# Patient Record
Sex: Female | Born: 1950 | Race: White | Hispanic: No | Marital: Single | Smoking: Former smoker
Health system: Southern US, Community
[De-identification: ages and names within clinical notes are randomized; demographics above are authoritative.]

## PROBLEM LIST (undated history)

## (undated) DIAGNOSIS — M810 Age-related osteoporosis without current pathological fracture: Secondary | ICD-10-CM

## (undated) DIAGNOSIS — E785 Hyperlipidemia, unspecified: Secondary | ICD-10-CM

## (undated) DIAGNOSIS — M549 Dorsalgia, unspecified: Secondary | ICD-10-CM

## (undated) DIAGNOSIS — R011 Cardiac murmur, unspecified: Secondary | ICD-10-CM

## (undated) DIAGNOSIS — G8929 Other chronic pain: Secondary | ICD-10-CM

## (undated) DIAGNOSIS — J45909 Unspecified asthma, uncomplicated: Secondary | ICD-10-CM

## (undated) DIAGNOSIS — C801 Malignant (primary) neoplasm, unspecified: Secondary | ICD-10-CM

## (undated) DIAGNOSIS — M1990 Unspecified osteoarthritis, unspecified site: Secondary | ICD-10-CM

## (undated) DIAGNOSIS — E739 Lactose intolerance, unspecified: Secondary | ICD-10-CM

## (undated) DIAGNOSIS — E039 Hypothyroidism, unspecified: Secondary | ICD-10-CM

## (undated) DIAGNOSIS — K279 Peptic ulcer, site unspecified, unspecified as acute or chronic, without hemorrhage or perforation: Secondary | ICD-10-CM

## (undated) DIAGNOSIS — I10 Essential (primary) hypertension: Secondary | ICD-10-CM

## (undated) DIAGNOSIS — I4891 Unspecified atrial fibrillation: Secondary | ICD-10-CM

## (undated) DIAGNOSIS — R931 Abnormal findings on diagnostic imaging of heart and coronary circulation: Secondary | ICD-10-CM

## (undated) DIAGNOSIS — Z973 Presence of spectacles and contact lenses: Secondary | ICD-10-CM

## (undated) DIAGNOSIS — K589 Irritable bowel syndrome without diarrhea: Secondary | ICD-10-CM

## (undated) DIAGNOSIS — Z853 Personal history of malignant neoplasm of breast: Secondary | ICD-10-CM

## (undated) DIAGNOSIS — E119 Type 2 diabetes mellitus without complications: Secondary | ICD-10-CM

## (undated) HISTORY — PX: OTHER SURGICAL HISTORY: SHX169

## (undated) HISTORY — DX: Personal history of malignant neoplasm of breast: Z85.3

## (undated) HISTORY — DX: Age-related osteoporosis without current pathological fracture: M81.0

## (undated) HISTORY — DX: Unspecified asthma, uncomplicated: J45.909

## (undated) HISTORY — DX: Irritable bowel syndrome, unspecified: K58.9

## (undated) HISTORY — PX: ABDOMINAL HYSTERECTOMY: SHX81

## (undated) HISTORY — DX: Type 2 diabetes mellitus without complications: E11.9

## (undated) HISTORY — DX: Lactose intolerance, unspecified: E73.9

## (undated) HISTORY — PX: BREAST RECONSTRUCTION: SHX9

## (undated) HISTORY — DX: Hypothyroidism, unspecified: E03.9

## (undated) HISTORY — DX: Abnormal findings on diagnostic imaging of heart and coronary circulation: R93.1

## (undated) HISTORY — DX: Essential (primary) hypertension: I10

## (undated) HISTORY — PX: BREAST IMPLANT REMOVAL: SHX5361

## (undated) HISTORY — DX: Other chronic pain: G89.29

## (undated) HISTORY — PX: PARTIAL GASTRECTOMY: SHX2172

## (undated) HISTORY — DX: Peptic ulcer, site unspecified, unspecified as acute or chronic, without hemorrhage or perforation: K27.9

## (undated) HISTORY — DX: Hyperlipidemia, unspecified: E78.5

## (undated) HISTORY — PX: CHOLECYSTECTOMY: SHX55

## (undated) HISTORY — DX: Malignant (primary) neoplasm, unspecified: C80.1

## (undated) HISTORY — DX: Unspecified atrial fibrillation: I48.91

## (undated) HISTORY — DX: Cardiac murmur, unspecified: R01.1

## (undated) HISTORY — DX: Dorsalgia, unspecified: M54.9

---

## 1997-05-02 DIAGNOSIS — K263 Acute duodenal ulcer without hemorrhage or perforation: Secondary | ICD-10-CM

## 1997-05-02 DIAGNOSIS — Z853 Personal history of malignant neoplasm of breast: Secondary | ICD-10-CM

## 1997-05-02 HISTORY — DX: Acute duodenal ulcer without hemorrhage or perforation: K26.3

## 1997-05-02 HISTORY — DX: Personal history of malignant neoplasm of breast: Z85.3

## 1997-11-05 ENCOUNTER — Other Ambulatory Visit: Admission: RE | Admit: 1997-11-05 | Discharge: 1997-11-05 | Payer: Self-pay | Admitting: *Deleted

## 1997-11-26 ENCOUNTER — Other Ambulatory Visit: Admission: RE | Admit: 1997-11-26 | Discharge: 1997-11-26 | Payer: Self-pay | Admitting: Gastroenterology

## 1997-12-24 ENCOUNTER — Ambulatory Visit (HOSPITAL_COMMUNITY): Admission: RE | Admit: 1997-12-24 | Discharge: 1997-12-24 | Payer: Self-pay | Admitting: Gastroenterology

## 1997-12-25 ENCOUNTER — Other Ambulatory Visit: Admission: RE | Admit: 1997-12-25 | Discharge: 1997-12-25 | Payer: Self-pay | Admitting: General Surgery

## 1997-12-30 ENCOUNTER — Encounter: Payer: Self-pay | Admitting: General Surgery

## 1997-12-31 HISTORY — PX: BREAST LUMPECTOMY: SHX2

## 1998-01-01 ENCOUNTER — Encounter: Payer: Self-pay | Admitting: General Surgery

## 1998-01-01 ENCOUNTER — Ambulatory Visit (HOSPITAL_COMMUNITY): Admission: RE | Admit: 1998-01-01 | Discharge: 1998-01-01 | Payer: Self-pay | Admitting: General Surgery

## 1998-01-07 ENCOUNTER — Inpatient Hospital Stay (HOSPITAL_COMMUNITY): Admission: RE | Admit: 1998-01-07 | Discharge: 1998-01-13 | Payer: Self-pay | Admitting: General Surgery

## 1998-01-14 ENCOUNTER — Encounter: Admission: RE | Admit: 1998-01-14 | Discharge: 1998-04-14 | Payer: Self-pay | Admitting: Radiation Oncology

## 1998-03-16 ENCOUNTER — Observation Stay (HOSPITAL_COMMUNITY): Admission: AD | Admit: 1998-03-16 | Discharge: 1998-03-17 | Payer: Self-pay | Admitting: Oncology

## 1998-03-31 ENCOUNTER — Emergency Department (HOSPITAL_COMMUNITY): Admission: EM | Admit: 1998-03-31 | Discharge: 1998-03-31 | Payer: Self-pay | Admitting: Emergency Medicine

## 1998-08-12 ENCOUNTER — Encounter: Admission: RE | Admit: 1998-08-12 | Discharge: 1998-11-10 | Payer: Self-pay | Admitting: Radiation Oncology

## 1999-03-09 ENCOUNTER — Encounter: Payer: Self-pay | Admitting: Oncology

## 1999-03-09 ENCOUNTER — Encounter: Admission: RE | Admit: 1999-03-09 | Discharge: 1999-03-09 | Payer: Self-pay | Admitting: Oncology

## 1999-04-15 ENCOUNTER — Encounter: Payer: Self-pay | Admitting: Internal Medicine

## 1999-04-15 ENCOUNTER — Ambulatory Visit (HOSPITAL_COMMUNITY): Admission: RE | Admit: 1999-04-15 | Discharge: 1999-04-15 | Payer: Self-pay | Admitting: Internal Medicine

## 1999-04-19 ENCOUNTER — Emergency Department (HOSPITAL_COMMUNITY): Admission: EM | Admit: 1999-04-19 | Discharge: 1999-04-19 | Payer: Self-pay | Admitting: Internal Medicine

## 1999-04-19 ENCOUNTER — Encounter: Payer: Self-pay | Admitting: Internal Medicine

## 1999-07-16 ENCOUNTER — Ambulatory Visit (HOSPITAL_COMMUNITY): Admission: RE | Admit: 1999-07-16 | Discharge: 1999-07-16 | Payer: Self-pay | Admitting: Neurosurgery

## 1999-07-16 ENCOUNTER — Encounter: Payer: Self-pay | Admitting: Neurosurgery

## 1999-07-30 ENCOUNTER — Encounter: Payer: Self-pay | Admitting: Neurosurgery

## 1999-07-30 ENCOUNTER — Ambulatory Visit (HOSPITAL_COMMUNITY): Admission: RE | Admit: 1999-07-30 | Discharge: 1999-07-30 | Payer: Self-pay | Admitting: Neurosurgery

## 1999-08-20 ENCOUNTER — Ambulatory Visit (HOSPITAL_COMMUNITY): Admission: RE | Admit: 1999-08-20 | Discharge: 1999-08-20 | Payer: Self-pay | Admitting: Neurosurgery

## 1999-08-20 ENCOUNTER — Encounter: Payer: Self-pay | Admitting: Neurosurgery

## 1999-09-27 ENCOUNTER — Encounter: Payer: Self-pay | Admitting: Oncology

## 1999-09-27 ENCOUNTER — Encounter: Admission: RE | Admit: 1999-09-27 | Discharge: 1999-09-27 | Payer: Self-pay | Admitting: Oncology

## 1999-11-10 ENCOUNTER — Ambulatory Visit (HOSPITAL_COMMUNITY): Admission: RE | Admit: 1999-11-10 | Discharge: 1999-11-12 | Payer: Self-pay | Admitting: Neurosurgery

## 1999-11-12 ENCOUNTER — Encounter: Payer: Self-pay | Admitting: Neurosurgery

## 2000-04-03 ENCOUNTER — Encounter: Payer: Self-pay | Admitting: Oncology

## 2000-04-03 ENCOUNTER — Encounter: Admission: RE | Admit: 2000-04-03 | Discharge: 2000-04-03 | Payer: Self-pay | Admitting: Oncology

## 2000-12-13 ENCOUNTER — Encounter: Admission: RE | Admit: 2000-12-13 | Discharge: 2000-12-13 | Payer: Self-pay | Admitting: Oncology

## 2000-12-13 ENCOUNTER — Encounter: Payer: Self-pay | Admitting: Oncology

## 2000-12-19 ENCOUNTER — Ambulatory Visit (HOSPITAL_COMMUNITY): Admission: RE | Admit: 2000-12-19 | Discharge: 2000-12-19 | Payer: Self-pay | Admitting: Neurosurgery

## 2000-12-21 ENCOUNTER — Encounter: Admission: RE | Admit: 2000-12-21 | Discharge: 2000-12-21 | Payer: Self-pay | Admitting: Neurosurgery

## 2000-12-21 ENCOUNTER — Encounter: Payer: Self-pay | Admitting: Neurosurgery

## 2001-01-15 ENCOUNTER — Encounter: Payer: Self-pay | Admitting: Neurosurgery

## 2001-01-15 ENCOUNTER — Encounter: Admission: RE | Admit: 2001-01-15 | Discharge: 2001-01-15 | Payer: Self-pay | Admitting: Neurosurgery

## 2001-02-01 ENCOUNTER — Encounter: Payer: Self-pay | Admitting: Neurosurgery

## 2001-02-01 ENCOUNTER — Encounter: Admission: RE | Admit: 2001-02-01 | Discharge: 2001-02-01 | Payer: Self-pay | Admitting: Neurosurgery

## 2001-09-14 ENCOUNTER — Encounter: Payer: Self-pay | Admitting: Emergency Medicine

## 2001-09-14 ENCOUNTER — Emergency Department (HOSPITAL_COMMUNITY): Admission: EM | Admit: 2001-09-14 | Discharge: 2001-09-14 | Payer: Self-pay | Admitting: Emergency Medicine

## 2001-12-18 ENCOUNTER — Encounter: Payer: Self-pay | Admitting: Oncology

## 2001-12-18 ENCOUNTER — Encounter: Admission: RE | Admit: 2001-12-18 | Discharge: 2001-12-18 | Payer: Self-pay | Admitting: Oncology

## 2002-01-18 ENCOUNTER — Encounter: Admission: RE | Admit: 2002-01-18 | Discharge: 2002-01-18 | Payer: Self-pay | Admitting: Neurosurgery

## 2002-01-18 ENCOUNTER — Encounter: Payer: Self-pay | Admitting: Neurosurgery

## 2002-01-31 ENCOUNTER — Encounter: Admission: RE | Admit: 2002-01-31 | Discharge: 2002-01-31 | Payer: Self-pay | Admitting: Neurosurgery

## 2002-01-31 ENCOUNTER — Encounter: Payer: Self-pay | Admitting: Neurosurgery

## 2002-05-22 ENCOUNTER — Emergency Department (HOSPITAL_COMMUNITY): Admission: EM | Admit: 2002-05-22 | Discharge: 2002-05-22 | Payer: Self-pay | Admitting: Emergency Medicine

## 2002-12-10 ENCOUNTER — Encounter: Payer: Self-pay | Admitting: Oncology

## 2002-12-10 ENCOUNTER — Encounter: Admission: RE | Admit: 2002-12-10 | Discharge: 2002-12-10 | Payer: Self-pay | Admitting: Oncology

## 2003-05-21 ENCOUNTER — Encounter: Admission: RE | Admit: 2003-05-21 | Discharge: 2003-05-21 | Payer: Self-pay | Admitting: Oncology

## 2003-06-20 ENCOUNTER — Encounter (INDEPENDENT_AMBULATORY_CARE_PROVIDER_SITE_OTHER): Payer: Self-pay | Admitting: *Deleted

## 2003-06-20 ENCOUNTER — Encounter: Admission: RE | Admit: 2003-06-20 | Discharge: 2003-06-20 | Payer: Self-pay | Admitting: General Surgery

## 2003-07-01 HISTORY — PX: MASTECTOMY: SHX3

## 2003-07-21 ENCOUNTER — Encounter (INDEPENDENT_AMBULATORY_CARE_PROVIDER_SITE_OTHER): Payer: Self-pay | Admitting: *Deleted

## 2003-07-21 ENCOUNTER — Inpatient Hospital Stay (HOSPITAL_COMMUNITY): Admission: RE | Admit: 2003-07-21 | Discharge: 2003-07-23 | Payer: Self-pay | Admitting: General Surgery

## 2004-01-19 ENCOUNTER — Encounter: Admission: RE | Admit: 2004-01-19 | Discharge: 2004-01-19 | Payer: Self-pay | Admitting: Family Medicine

## 2004-03-29 ENCOUNTER — Ambulatory Visit: Payer: Self-pay | Admitting: Oncology

## 2004-03-31 ENCOUNTER — Ambulatory Visit (HOSPITAL_COMMUNITY): Admission: RE | Admit: 2004-03-31 | Discharge: 2004-03-31 | Payer: Self-pay | Admitting: Plastic Surgery

## 2004-10-13 ENCOUNTER — Ambulatory Visit (HOSPITAL_COMMUNITY): Admission: RE | Admit: 2004-10-13 | Discharge: 2004-10-13 | Payer: Self-pay | Admitting: Plastic Surgery

## 2004-10-19 ENCOUNTER — Ambulatory Visit (HOSPITAL_COMMUNITY): Admission: RE | Admit: 2004-10-19 | Discharge: 2004-10-20 | Payer: Self-pay | Admitting: Plastic Surgery

## 2005-03-01 ENCOUNTER — Ambulatory Visit: Payer: Self-pay | Admitting: Gastroenterology

## 2005-03-21 ENCOUNTER — Encounter (INDEPENDENT_AMBULATORY_CARE_PROVIDER_SITE_OTHER): Payer: Self-pay | Admitting: *Deleted

## 2005-03-21 ENCOUNTER — Ambulatory Visit: Payer: Self-pay | Admitting: Gastroenterology

## 2005-03-29 ENCOUNTER — Ambulatory Visit: Payer: Self-pay | Admitting: *Deleted

## 2005-04-08 ENCOUNTER — Ambulatory Visit (HOSPITAL_COMMUNITY): Admission: RE | Admit: 2005-04-08 | Discharge: 2005-04-08 | Payer: Self-pay | Admitting: Gastroenterology

## 2005-04-18 ENCOUNTER — Encounter: Admission: RE | Admit: 2005-04-18 | Discharge: 2005-04-18 | Payer: Self-pay | Admitting: Family Medicine

## 2005-04-19 ENCOUNTER — Ambulatory Visit: Payer: Self-pay | Admitting: Gastroenterology

## 2005-08-18 ENCOUNTER — Emergency Department (HOSPITAL_COMMUNITY): Admission: EM | Admit: 2005-08-18 | Discharge: 2005-08-19 | Payer: Self-pay | Admitting: Emergency Medicine

## 2005-08-19 ENCOUNTER — Encounter: Payer: Self-pay | Admitting: Vascular Surgery

## 2006-04-19 ENCOUNTER — Encounter: Admission: RE | Admit: 2006-04-19 | Discharge: 2006-04-19 | Payer: Self-pay | Admitting: Family Medicine

## 2007-04-23 ENCOUNTER — Encounter: Admission: RE | Admit: 2007-04-23 | Discharge: 2007-04-23 | Payer: Self-pay | Admitting: Family Medicine

## 2008-01-18 ENCOUNTER — Encounter: Admission: RE | Admit: 2008-01-18 | Discharge: 2008-01-18 | Payer: Self-pay | Admitting: Family Medicine

## 2008-04-28 ENCOUNTER — Encounter: Admission: RE | Admit: 2008-04-28 | Discharge: 2008-04-28 | Payer: Self-pay | Admitting: Family Medicine

## 2008-09-30 DIAGNOSIS — R931 Abnormal findings on diagnostic imaging of heart and coronary circulation: Secondary | ICD-10-CM

## 2008-09-30 HISTORY — DX: Abnormal findings on diagnostic imaging of heart and coronary circulation: R93.1

## 2009-04-17 ENCOUNTER — Inpatient Hospital Stay (HOSPITAL_COMMUNITY): Admission: EM | Admit: 2009-04-17 | Discharge: 2009-04-22 | Payer: Self-pay | Admitting: Emergency Medicine

## 2009-07-13 ENCOUNTER — Ambulatory Visit (HOSPITAL_BASED_OUTPATIENT_CLINIC_OR_DEPARTMENT_OTHER): Admission: RE | Admit: 2009-07-13 | Discharge: 2009-07-13 | Payer: Self-pay | Admitting: Orthopedic Surgery

## 2009-11-30 ENCOUNTER — Emergency Department (HOSPITAL_COMMUNITY): Admission: EM | Admit: 2009-11-30 | Discharge: 2009-11-30 | Payer: Self-pay | Admitting: Emergency Medicine

## 2010-01-14 ENCOUNTER — Encounter (INDEPENDENT_AMBULATORY_CARE_PROVIDER_SITE_OTHER): Payer: Self-pay | Admitting: *Deleted

## 2010-06-01 NOTE — Letter (Signed)
Summary: Colonoscopy Letter  [facility]  [address] [place], [place] [address]   Phone: [phone]  Fax: [phone]      January 14, 2010 MRN: [id number]   [facility] [address] [place], [place]  [address]   Dear Ms. STONE,   According to your medical record, it is time for you to schedule a Colonoscopy. The American Cancer Society recommends this procedure as a method to detect early colon cancer. Patients with a family history of colon cancer, or a personal history of colon polyps or inflammatory bowel disease are at increased risk.  This letter has beeen generated based on the recommendations made at the time of your procedure. If you feel that in your particular situation this may no longer apply, please contact our office.  Please call our office at [phone] to schedule this appointment or to update your records at your earliest convenience.  Thank you for cooperating with [place] to provide you with the very best care possible.   Sincerely,  Judie Petit T. Russella Dar, M.D.  [facility] [phone]

## 2010-07-26 LAB — POCT I-STAT 4, (NA,K, GLUC, HGB,HCT)
Glucose, Bld: 158 mg/dL — ABNORMAL HIGH (ref 70–99)
Hemoglobin: 13.9 g/dL (ref 12.0–15.0)
Potassium: 3.5 mEq/L (ref 3.5–5.1)
Sodium: 142 mEq/L (ref 135–145)

## 2010-08-02 LAB — GLUCOSE, CAPILLARY
Glucose-Capillary: 171 mg/dL — ABNORMAL HIGH (ref 70–99)
Glucose-Capillary: 199 mg/dL — ABNORMAL HIGH (ref 70–99)
Glucose-Capillary: 219 mg/dL — ABNORMAL HIGH (ref 70–99)
Glucose-Capillary: 230 mg/dL — ABNORMAL HIGH (ref 70–99)
Glucose-Capillary: 254 mg/dL — ABNORMAL HIGH (ref 70–99)
Glucose-Capillary: 276 mg/dL — ABNORMAL HIGH (ref 70–99)

## 2010-08-02 LAB — BASIC METABOLIC PANEL
BUN: 2 mg/dL — ABNORMAL LOW (ref 6–23)
BUN: 5 mg/dL — ABNORMAL LOW (ref 6–23)
Calcium: 8.4 mg/dL (ref 8.4–10.5)
Chloride: 93 mEq/L — ABNORMAL LOW (ref 96–112)
Creatinine, Ser: 0.43 mg/dL (ref 0.4–1.2)
GFR calc non Af Amer: >60 mL/min (ref >60)
GFR calc non Af Amer: >60 mL/min (ref >60)
Glucose, Bld: 223 mg/dL — ABNORMAL HIGH (ref 70–99)
Sodium: 134 mEq/L — ABNORMAL LOW (ref 135–145)

## 2010-08-02 LAB — CBC
HCT: 40.3 % (ref 36.0–46.0)
Hemoglobin: 13.6 g/dL (ref 12.0–15.0)
MCHC: 33.8 g/dL (ref 30.0–36.0)
MCHC: 34.1 g/dL (ref 30.0–36.0)
Platelets: 176 10*3/uL (ref 150–400)
Platelets: 205 10*3/uL (ref 150–400)
RDW: 12.3 % (ref 11.5–15.5)
RDW: 12.8 % (ref 11.5–15.5)

## 2010-08-02 LAB — DIFFERENTIAL
Basophils Absolute: 0 10*3/uL (ref 0.0–0.1)
Basophils Relative: 0 % (ref 0–1)
Eosinophils Relative: 0 % (ref 0–5)
Monocytes Absolute: 0.4 10*3/uL (ref 0.1–1.0)
Neutro Abs: 7.9 10*3/uL — ABNORMAL HIGH (ref 1.7–7.7)

## 2010-08-02 LAB — ABO/RH: ABO/RH(D): O POS

## 2010-08-02 LAB — COMPREHENSIVE METABOLIC PANEL
AST: 39 U/L — ABNORMAL HIGH (ref 0–37)
Albumin: 3.9 g/dL (ref 3.5–5.2)
Alkaline Phosphatase: 63 U/L (ref 39–117)
BUN: 8 mg/dL (ref 6–23)
CO2: 31 mEq/L (ref 19–32)
Chloride: 98 mEq/L (ref 96–112)
GFR calc non Af Amer: >60 mL/min (ref >60)
Potassium: 3.5 mEq/L (ref 3.5–5.1)
Total Bilirubin: 0.6 mg/dL (ref 0.3–1.2)

## 2010-08-02 LAB — PROTIME-INR
INR: 1.23 (ref 0.00–1.49)
INR: 1.98 — ABNORMAL HIGH (ref 0.00–1.49)
INR: 2.35 — ABNORMAL HIGH (ref 0.00–1.49)
INR: 3.14 — ABNORMAL HIGH (ref 0.00–1.49)
Prothrombin Time: 22.3 seconds — ABNORMAL HIGH (ref 11.6–15.2)

## 2010-09-17 NOTE — Op Note (Signed)
NAME:  [name], [name]                         ACCOUNT NO.:  [id number]   MEDICAL RECORD NO.:  [id number]                   PATIENT TYPE:  INP   LOCATION:  2899                                 FACILITY:  [facility]   PHYSICIAN:  Etter Sjogren, M.D.                  DATE OF BIRTH:  05/13/1950   DATE OF PROCEDURE:  07/21/2003  DATE OF DISCHARGE:                                 OPERATIVE REPORT   PREOPERATIVE DIAGNOSIS:  Left breast cancer, acquired absence of left  breast.   POSTOPERATIVE DIAGNOSIS:  Left breast cancer, acquired absence of left  breast.   OPERATION PERFORMED:  1. Left breast reconstruction with latissimus myocutaneous flap.  2. Left breast reconstruction with placement of a tissue expander as a     distinct procedure.  3. Placement of an On-Q catheter.   SURGEON:  Etter Sjogren, M.D.   ASSISTANT:  Pleas Patricia, M.D.   ANESTHESIA:  General.   ESTIMATED BLOOD LOSS:  100 mL.   DRAINS:  Four Blakes were left, two back, two chest.   INDICATIONS FOR PROCEDURE:  The patient is a 60 year old woman who has had  left breast cancer treated with lumpectomy and radiation therapy some years  ago.  She now has a recurrence.  A mastectomy was recommended.  She is  interested in breast reconstruction.  Options were discussed with her and  her best option, given the radiation and the fact that she has had multiple  operations on her abdomen would be a latissimus muscle flap with placement  of a tissue expander.  This would help avoid the problems of expanding  radiated tissue and also provide a good viable tissue for the radiated  field.  The nature of the procedure, the risks and possible complications  were discussed with her in great detail and she understood all of this and  wished to proceed.   DESCRIPTION OF PROCEDURE:  The patient had already been marked for the  latissimus muscle flap in the full upright position.  She was in the  operating room and had tolerated the  mastectomy well.  The mastectomy site  was stapled closed and covered with a sterile Ioban.  She was then placed in  a right lateral decubitus position with an axillary roll on the bean bag.  She was prepped with Betadine and draped with sterile drapes.  The skin  paddle was incised.  Dissection was carried down through subcutaneous tissue  using electrocautery.  Underlying latissimus muscle identified.  The  dissection was carried out to the limits of the latissimus muscle anteriorly  and posteriorly.  Superiorly the muscle border was identified and inferiorly  it was carried out for a distance of approximately two thirds of the length  of the latissimus muscle.  Dissection carried deep to the muscle.  Some  larger blood vessels were ligated with Ligaclips and divided.  Otherwise,  the  electrocautery was used for hemostasis.  The muscle was then divided  distally with meticulous hemostasis with electrocautery.  The muscle flap  was rotated easily into the mastectomy defect from underneath through a  subcutaneous tunnel connecting to the mastectomy defect.  Meticulous  hemostasis with electrocautery.  Thorough irrigated with saline.  Again  meticulous hemostasis with electrocautery.  Blake drains were positioned and  brought out through separate stab wounds inferomedially and secured with 3-0  Prolene sutures.  The On-Q catheter was then positioned in the wound.  The  would closure with 2-0 Monocryl interrupted inverted deep sutures, 2-0  Monocryl interrupted inverted deep dermal sutures and a running 3-0 Vicryl  subcuticular suture.  Steri-Strips and a dry sterile dressing were applied  and secured in place again with a sterile Ioban.  She was then placed in  supine position.  She was reprepped with Betadine and draped with sterile  drapes.  Having reprepped her, the skin staples were taken off out of the  wound and the flap was inspected.  The skin paddle had excellent color and   excellent capillary refill with bright red bleeding at its periphery  consistent with viability.  Thorough irrigation with saline, meticulous  hemostasis with electrocautery.  Blake drains were positioned, one medially,  one laterally and brought out through separate stab wounds inferiorly and  secured with 3-0 plain sutures.  Excellent hemostasis having been confirmed,  the muscle insetting 3-0 Vicryl interrupted figure-of-eight sutures, closing  inferiorly first.  The pocket was then made subcutaneously for the remote  valve for the tissue expander.  Operating surgeon thoroughly cleaning  gloves, the tissue expander was soaked in antibiotic solution.  The tissue  expander was a mentor smooth round tissue expander 400 mL with a remote  injection dome.  The lot number on it [id number], 400 mL total.  Al the air was  removed.  The tissue expander was positioned as was the remote valve in the  subcutaneous tissue pocket inferiorly.  100 mL of sterile saline placed.  The port was accessed from the outside to make sure there was free flow and  a total of 200 mL of sterile saline was placed using the closed fill system.  The remainder of the muscle flap closure to the pectoralis with a 3-0 Vicryl  interrupted figure-of-eight sutures.  Skin paddle continued to have  excellent color.  The remainder of the skin flap insetting with 3-0 Vicryl  interrupted inverted deep sutures and a running 3-0 Monocryl subcuticular  suture.  Steri-Strips, dry sterile dressing were applied and she was  transferred to the recovery room in stable condition, having tolerated the  procedure well.                                               Etter Sjogren, M.D.    DB/MEDQ  D:  07/21/2003  T:  07/22/2003  Job:  [id number]

## 2010-09-17 NOTE — Discharge Summary (Signed)
[name], [name]               ACCOUNT NO.:  [id number]   MEDICAL RECORD NO.:  [id number]          PATIENT TYPE:  OIB   LOCATION:  5712                         FACILITY:  [facility]   PHYSICIAN:  Etter Sjogren, M.D.     DATE OF BIRTH:  [birth year]   DATE OF ADMISSION:  10/19/2004  DATE OF DISCHARGE:  10/20/2004                                 DISCHARGE SUMMARY   FINAL DIAGNOSES:  Infected left breast implant, status post reconstruction  for breast cancer.   PROCEDURE:  Removal of left breast implant and drainage of abscess, left  chest.   HISTORY OF PRESENT ILLNESS:  This 60 year old woman has had breast  reconstruction for breast cancer using latissimus flap with expander  followed by placement of an implant. That became infected and she had that  removed. It has now been 6 months and she had replacement of the implant  last week. Unfortunately, she has again developed an infection and she is  taken to surgery at this time for removal of the implant. For further  details of the history and physical, please see the chart.   HOSPITAL COURSE:  On admission, she was taken to surgery at which time the  implant was removed. The area was drained. She tolerated this well. She was  on intravenous Cleocin. Postoperatively, she did well. The following day,  she was afebrile. CBG was in the 150 range, under very good control. Chest  was non-tender and the patient voiced that she felt remarkably better. It is  felt that she is ready to be discharged.   DISPOSITION:  She is discharged on all medications as previous. She will be  on Cleocin 300 mg p.o. q.i.d. She has Percocet at home for pain. She will  empty the drain 3 times a day and record and I will see her back in the  office next week for recheck.       DB/MEDQ  D:  10/20/2004  T:  10/20/2004  Job:  [id number]

## 2010-09-17 NOTE — Op Note (Signed)
[name], [name]               ACCOUNT NO.:  [id number]   MEDICAL RECORD NO.:  [id number]          PATIENT TYPE:  OIB   LOCATION:  5712                         FACILITY:  [facility]   PHYSICIAN:  Etter Sjogren, M.D.     DATE OF BIRTH:  1950-09-06   DATE OF PROCEDURE:  10/19/2004  DATE OF DISCHARGE:                                 OPERATIVE REPORT   PREOPERATIVE DIAGNOSIS:  Infected left breast implant placed for  reconstruction of breast cancer.   POSTOPERATIVE DIAGNOSIS:  Infected left breast implant placed for  reconstruction of breast cancer.   PROCEDURE:  1.  Removal of left breast implant.  2.  Drainage of left chest abscess.   SURGEON:  Etter Sjogren, M.D.   ANESTHESIA:  General.   ESTIMATED BLOOD LOSS:  100 mL.   SPECIMENS:  Cultures taken as well as gram stain.   DRAINS:  One Harrison Mons was left.   INDICATIONS FOR PROCEDURE:  This 60 year old woman has had Latissimus flap  followed by tissue expansion, followed by placement of implant that became  infected last year.  That was removed.  After waiting 6 months, everything  looked very good and 2 weeks ago she was returned to surgery for replacement  of implant.  That did very well and then yesterday morning, she developed  some purulence from the drain and it was felt that this implant also was  infected.  She has not had any significant fevers.  White blood cell count  12,600 on admission today.  Removal of the implant was discussed with her.  She also understood why we would not be removing any excess skin at this  time, we want things to heal first and then go back and trim any excess skin  away because she does not feel that she wants to attempt any further  reconstruction in the future.  Having discussed all of this, she wishes to  proceed.   DESCRIPTION OF PROCEDURE:  The patient was taken to the operating room and  placed supine.  After successful induction of general anesthesia, she was  prepped with Hibiclens  and draped with sterile drapes.  The old incision was  used at the superior aspect of the latissimus skin paddle.  The dissection  was carried down directly to the implant through the muscle layers.  The  implant was identified.  There was a little bit of purulence in the pocket.  Cultures were taken.  The implant was removed.  The capsule was removed  using forceps and curet.  Thorough irrigation with saline.  Good hemostasis  having been confirmed, a Blake drain was positioned, brought through a  separate stab wound inferolaterally and secured with a 3-0 Prolene suture.  The closure with 2-0 nylon interrupted vertical mattress sutures and simple  single sutures and these were left untied while the pocket was irrigated  with antibiotic solution which was allowed to dwell for over 5 minutes.  The  antibiotic solution was removed.  Inspection revealed excellent hemostasis.  The nylon sutures were then tied and the Xeroform gauze bolsters were used  under the 2-0 nylon vertical mattress sutures to avoid any skin irritation.  She was then dressed with a dry sterile dressing  and circumferential Ace wrap lightly applied.  Transferred to the recovery  room in stable condition having tolerated the procedure well.   DISPOSITION:  She will be observed overnight.       DB/MEDQ  D:  10/19/2004  T:  10/20/2004  Job:  [id number]

## 2010-09-17 NOTE — Op Note (Signed)
NAME:  [name], [name]                         ACCOUNT NO.:  [id number]   MEDICAL RECORD NO.:  [id number]                   PATIENT TYPE:  INP   LOCATION:  2899                                 FACILITY:  [facility]   PHYSICIAN:  Gita Kudo, M.D.              DATE OF BIRTH:  August 26, 1950   DATE OF PROCEDURE:  07/21/2003  DATE OF DISCHARGE:                                 OPERATIVE REPORT   PROCEDURE:  Left total mastectomy (to be followed by immediate  reconstruction by Dr. Odis Luster).   SURGEON:  Gita Kudo, M.D.   ASSISTANT:  Dr. Rose Phi. Young.   ANESTHESIA:  General endotracheal.   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   CLINICAL SUMMARY:  This 60 year old woman underwent a left partial  mastectomy and axillary sentinel node biopsy for a 2.7-cm hormone-positive,  node-negative cancer.  She has been maintained on tamoxifen.  She has had  radiation therapy.  She has been seen by Dr. Cyndie Chime, a mammogram is  abnormal, and stereo biopsy is atypical.  Therefore, either further biopsy  or mastectomy was offered and the patient prefers mastectomy with  reconstruction.   FINDINGS:  The patient's breast was somewhat thickened and indurated because  of the previous radiation.  I saw no gross evidence of abnormality.   DESCRIPTION OF PROCEDURE:  Under satisfactory general endotracheal  anesthesia, the patient received 1 gram of Ancef preop.  She was positioned,  prepped and draped in a standard fashion.  A transversely oriented  elliptical skin incision was made encompassing the nipple.  Superior and  inferior flaps were developed.  Dissection was carried to the pectoralis  muscle below the clavicle, midline, abdominal wall, and laterally to the  latissimus  muscle.  The breast was then removed from the pectoralis muscle  with cautery.  The wound was lavaged with saline and made hemostatic by  cautery.  A temporary dressing was applied and the skin loosely stapled.  The specimen was marked for pathology and sent for inspection.  Dr. Odis Luster  will then complete the operation by doing a reconstruction by latissimus  flap.                                               Gita Kudo, M.D.    MRL/MEDQ  D:  07/21/2003  T:  07/21/2003  Job:  [id number]   cc:   Genene Churn. Cyndie Chime, M.D.  [address] [facility]  [place]  [place] [address]  Fax: [phone]   Etter Sjogren, M.D.  [address]  [place] [address]  Fax: [phone]   Juluis Mire, M.D.  [address] [place]  [place] [address]  Fax: [phone]

## 2010-09-17 NOTE — Discharge Summary (Signed)
NAME:  [name], [name]                         ACCOUNT NO.:  [id number]   MEDICAL RECORD NO.:  [id number]                   PATIENT TYPE:  INP   LOCATION:  5715                                 FACILITY:  [facility]   PHYSICIAN:  Etter Sjogren, M.D.                  DATE OF BIRTH:  1950/12/01   DATE OF ADMISSION:  07/21/2003  DATE OF DISCHARGE:  07/23/2003                                 DISCHARGE SUMMARY   FINAL DIAGNOSIS:  Left breast cancer.   PROCEDURES PERFORMED:  1. Left total mastectomy.  2. Left breast reconstruction using latissimus muscle myocutaneous flap.  3. Placement of tissue expander for left breast reconstruction.  4. Placement of On-Q catheter.   HISTORY:  This is a 60 year old woman who has had previous lumpectomy and  radiation.  She has subsequently experienced a failure and now has recurrent  breast cancer in the left breast.  A total mastectomy is recommended.  She  was interested in breast reconstruction.  She is a little bit heavy and has  multiple surgical scars on her abdomen, precluding a TRAM flap.  The use of  her own tissue was discussed using latissimus muscle myocutaneous flap since  she does have radiation.  A tissue expander could be placed under the  nonirradiated muscle flap with a skin paddle for tissue expansion. This was  discussed with  her in great detail and she understood the risks and  possible complications.  She wished to proceed.  For further details of the  history and physical please see the chart.   HOSPITAL COURSE:  On admission she was taken to surgery at which time a  mastectomy and the reconstruction with the latissimus myocutaneous flap and  tissue expander were performed.  An On-Q catheter was also placed at that  time.  The patient tolerated the procedure well.  She remained afebrile.  The flap maintained excellent color and excellent capillary refill.  The  donor site was fine without any evidence of hematoma.  Drains were  functional.  By the second postoperative day she was tolerating a regular  diet.  She was having minimal discomfort.  It is felt that she is ready to  be discharged.   DISPOSITION:  She is discharged on diet as tolerated.  She was instructed to  avoid vigorous activity, lifting or raising her left arm.  She is to measure  drainage three times per day and record this.  She is to use incentive  spirometry at least six times per day at home.  No shower yet.  We will see  her back in the office for recheck next week with our nurse and then the  following week with me.   DISCHARGE MEDICATIONS:  1. Keflex 250 mg p.o. q.i.d.  2. MS-Contin 50 mg one p.o. q.12h.  Etter Sjogren, M.D.    DB/MEDQ  D:  07/23/2003  T:  07/24/2003  Job:  [id number]

## 2011-08-05 ENCOUNTER — Other Ambulatory Visit: Payer: Self-pay

## 2012-01-19 ENCOUNTER — Encounter: Payer: Self-pay | Admitting: Gastroenterology

## 2012-03-12 ENCOUNTER — Other Ambulatory Visit: Payer: Self-pay | Admitting: Family Medicine

## 2012-03-12 DIAGNOSIS — Z1231 Encounter for screening mammogram for malignant neoplasm of breast: Secondary | ICD-10-CM

## 2012-03-12 DIAGNOSIS — Z9012 Acquired absence of left breast and nipple: Secondary | ICD-10-CM

## 2012-04-11 ENCOUNTER — Ambulatory Visit
Admission: RE | Admit: 2012-04-11 | Discharge: 2012-04-11 | Disposition: A | Discharge status: Home / Self Care | Payer: BC Managed Care – PPO | Source: Ambulatory Visit | Attending: Family Medicine | Admitting: Family Medicine

## 2012-04-11 DIAGNOSIS — Z1231 Encounter for screening mammogram for malignant neoplasm of breast: Secondary | ICD-10-CM

## 2012-04-11 DIAGNOSIS — Z9012 Acquired absence of left breast and nipple: Secondary | ICD-10-CM

## 2014-08-28 DIAGNOSIS — M0609 Rheumatoid arthritis without rheumatoid factor, multiple sites: Secondary | ICD-10-CM | POA: Diagnosis not present

## 2014-09-30 DIAGNOSIS — F329 Major depressive disorder, single episode, unspecified: Secondary | ICD-10-CM | POA: Diagnosis not present

## 2014-09-30 DIAGNOSIS — M069 Rheumatoid arthritis, unspecified: Secondary | ICD-10-CM | POA: Diagnosis not present

## 2014-09-30 DIAGNOSIS — E119 Type 2 diabetes mellitus without complications: Secondary | ICD-10-CM | POA: Diagnosis not present

## 2014-09-30 DIAGNOSIS — I10 Essential (primary) hypertension: Secondary | ICD-10-CM | POA: Diagnosis not present

## 2014-09-30 DIAGNOSIS — E780 Pure hypercholesterolemia: Secondary | ICD-10-CM | POA: Diagnosis not present

## 2014-09-30 DIAGNOSIS — M545 Low back pain: Secondary | ICD-10-CM | POA: Diagnosis not present

## 2014-09-30 DIAGNOSIS — E039 Hypothyroidism, unspecified: Secondary | ICD-10-CM | POA: Diagnosis not present

## 2014-10-02 DIAGNOSIS — M810 Age-related osteoporosis without current pathological fracture: Secondary | ICD-10-CM | POA: Diagnosis not present

## 2014-10-02 DIAGNOSIS — M150 Primary generalized (osteo)arthritis: Secondary | ICD-10-CM | POA: Diagnosis not present

## 2014-10-02 DIAGNOSIS — Z79899 Other long term (current) drug therapy: Secondary | ICD-10-CM | POA: Diagnosis not present

## 2014-10-02 DIAGNOSIS — M059 Rheumatoid arthritis with rheumatoid factor, unspecified: Secondary | ICD-10-CM | POA: Diagnosis not present

## 2014-10-08 DIAGNOSIS — M0609 Rheumatoid arthritis without rheumatoid factor, multiple sites: Secondary | ICD-10-CM | POA: Diagnosis not present

## 2014-11-19 DIAGNOSIS — M0609 Rheumatoid arthritis without rheumatoid factor, multiple sites: Secondary | ICD-10-CM | POA: Diagnosis not present

## 2014-12-17 DIAGNOSIS — M150 Primary generalized (osteo)arthritis: Secondary | ICD-10-CM | POA: Diagnosis not present

## 2014-12-17 DIAGNOSIS — M25511 Pain in right shoulder: Secondary | ICD-10-CM | POA: Diagnosis not present

## 2014-12-17 DIAGNOSIS — M059 Rheumatoid arthritis with rheumatoid factor, unspecified: Secondary | ICD-10-CM | POA: Diagnosis not present

## 2014-12-17 DIAGNOSIS — Z79899 Other long term (current) drug therapy: Secondary | ICD-10-CM | POA: Diagnosis not present

## 2015-01-01 DIAGNOSIS — M0609 Rheumatoid arthritis without rheumatoid factor, multiple sites: Secondary | ICD-10-CM | POA: Diagnosis not present

## 2015-02-16 DIAGNOSIS — M0609 Rheumatoid arthritis without rheumatoid factor, multiple sites: Secondary | ICD-10-CM | POA: Diagnosis not present

## 2015-02-26 DIAGNOSIS — Z23 Encounter for immunization: Secondary | ICD-10-CM | POA: Diagnosis not present

## 2015-03-25 DIAGNOSIS — Z23 Encounter for immunization: Secondary | ICD-10-CM | POA: Diagnosis not present

## 2015-03-25 DIAGNOSIS — I10 Essential (primary) hypertension: Secondary | ICD-10-CM | POA: Diagnosis not present

## 2015-03-25 DIAGNOSIS — E7800 Pure hypercholesterolemia, unspecified: Secondary | ICD-10-CM | POA: Diagnosis not present

## 2015-03-25 DIAGNOSIS — M545 Low back pain: Secondary | ICD-10-CM | POA: Diagnosis not present

## 2015-03-25 DIAGNOSIS — Z794 Long term (current) use of insulin: Secondary | ICD-10-CM | POA: Diagnosis not present

## 2015-03-25 DIAGNOSIS — E119 Type 2 diabetes mellitus without complications: Secondary | ICD-10-CM | POA: Diagnosis not present

## 2015-03-25 DIAGNOSIS — M069 Rheumatoid arthritis, unspecified: Secondary | ICD-10-CM | POA: Diagnosis not present

## 2015-03-25 DIAGNOSIS — F329 Major depressive disorder, single episode, unspecified: Secondary | ICD-10-CM | POA: Diagnosis not present

## 2015-03-25 DIAGNOSIS — E039 Hypothyroidism, unspecified: Secondary | ICD-10-CM | POA: Diagnosis not present

## 2015-04-09 DIAGNOSIS — M0609 Rheumatoid arthritis without rheumatoid factor, multiple sites: Secondary | ICD-10-CM | POA: Diagnosis not present

## 2015-04-09 DIAGNOSIS — Z79899 Other long term (current) drug therapy: Secondary | ICD-10-CM | POA: Diagnosis not present

## 2015-04-09 DIAGNOSIS — M0589 Other rheumatoid arthritis with rheumatoid factor of multiple sites: Secondary | ICD-10-CM | POA: Diagnosis not present

## 2015-04-09 DIAGNOSIS — M810 Age-related osteoporosis without current pathological fracture: Secondary | ICD-10-CM | POA: Diagnosis not present

## 2015-04-09 DIAGNOSIS — M05731 Rheumatoid arthritis with rheumatoid factor of right wrist without organ or systems involvement: Secondary | ICD-10-CM | POA: Diagnosis not present

## 2015-05-21 DIAGNOSIS — M0609 Rheumatoid arthritis without rheumatoid factor, multiple sites: Secondary | ICD-10-CM | POA: Diagnosis not present

## 2015-07-02 DIAGNOSIS — M0609 Rheumatoid arthritis without rheumatoid factor, multiple sites: Secondary | ICD-10-CM | POA: Diagnosis not present

## 2015-07-02 DIAGNOSIS — M810 Age-related osteoporosis without current pathological fracture: Secondary | ICD-10-CM | POA: Diagnosis not present

## 2015-07-02 DIAGNOSIS — M150 Primary generalized (osteo)arthritis: Secondary | ICD-10-CM | POA: Diagnosis not present

## 2015-07-02 DIAGNOSIS — M0589 Other rheumatoid arthritis with rheumatoid factor of multiple sites: Secondary | ICD-10-CM | POA: Diagnosis not present

## 2015-07-02 DIAGNOSIS — Z79899 Other long term (current) drug therapy: Secondary | ICD-10-CM | POA: Diagnosis not present

## 2015-07-23 DIAGNOSIS — L509 Urticaria, unspecified: Secondary | ICD-10-CM | POA: Diagnosis not present

## 2015-08-19 DIAGNOSIS — M0609 Rheumatoid arthritis without rheumatoid factor, multiple sites: Secondary | ICD-10-CM | POA: Diagnosis not present

## 2015-09-30 DIAGNOSIS — M0589 Other rheumatoid arthritis with rheumatoid factor of multiple sites: Secondary | ICD-10-CM | POA: Diagnosis not present

## 2015-09-30 DIAGNOSIS — Z79899 Other long term (current) drug therapy: Secondary | ICD-10-CM | POA: Diagnosis not present

## 2015-09-30 DIAGNOSIS — M0609 Rheumatoid arthritis without rheumatoid factor, multiple sites: Secondary | ICD-10-CM | POA: Diagnosis not present

## 2015-09-30 DIAGNOSIS — M1A9XX1 Chronic gout, unspecified, with tophus (tophi): Secondary | ICD-10-CM | POA: Diagnosis not present

## 2015-09-30 DIAGNOSIS — M150 Primary generalized (osteo)arthritis: Secondary | ICD-10-CM | POA: Diagnosis not present

## 2015-10-27 DIAGNOSIS — M069 Rheumatoid arthritis, unspecified: Secondary | ICD-10-CM | POA: Diagnosis not present

## 2015-11-11 DIAGNOSIS — M06832 Other specified rheumatoid arthritis, left wrist: Secondary | ICD-10-CM | POA: Diagnosis not present

## 2015-11-11 DIAGNOSIS — Z79899 Other long term (current) drug therapy: Secondary | ICD-10-CM | POA: Diagnosis not present

## 2015-11-11 DIAGNOSIS — M05131 Rheumatoid lung disease with rheumatoid arthritis of right wrist: Secondary | ICD-10-CM | POA: Diagnosis not present

## 2015-11-11 DIAGNOSIS — M0579 Rheumatoid arthritis with rheumatoid factor of multiple sites without organ or systems involvement: Secondary | ICD-10-CM | POA: Diagnosis not present

## 2015-11-11 DIAGNOSIS — M0589 Other rheumatoid arthritis with rheumatoid factor of multiple sites: Secondary | ICD-10-CM | POA: Diagnosis not present

## 2015-11-11 DIAGNOSIS — M0609 Rheumatoid arthritis without rheumatoid factor, multiple sites: Secondary | ICD-10-CM | POA: Diagnosis not present

## 2015-11-11 DIAGNOSIS — Z9229 Personal history of other drug therapy: Secondary | ICD-10-CM | POA: Diagnosis not present

## 2015-11-11 DIAGNOSIS — M150 Primary generalized (osteo)arthritis: Secondary | ICD-10-CM | POA: Diagnosis not present

## 2015-11-11 DIAGNOSIS — M06831 Other specified rheumatoid arthritis, right wrist: Secondary | ICD-10-CM | POA: Diagnosis not present

## 2015-11-27 DIAGNOSIS — M069 Rheumatoid arthritis, unspecified: Secondary | ICD-10-CM | POA: Diagnosis not present

## 2015-11-27 DIAGNOSIS — E785 Hyperlipidemia, unspecified: Secondary | ICD-10-CM | POA: Diagnosis not present

## 2015-11-27 DIAGNOSIS — I10 Essential (primary) hypertension: Secondary | ICD-10-CM | POA: Diagnosis not present

## 2015-11-27 DIAGNOSIS — Z7984 Long term (current) use of oral hypoglycemic drugs: Secondary | ICD-10-CM | POA: Diagnosis not present

## 2015-11-27 DIAGNOSIS — Z794 Long term (current) use of insulin: Secondary | ICD-10-CM | POA: Diagnosis not present

## 2015-11-27 DIAGNOSIS — E1165 Type 2 diabetes mellitus with hyperglycemia: Secondary | ICD-10-CM | POA: Diagnosis not present

## 2015-11-27 DIAGNOSIS — F324 Major depressive disorder, single episode, in partial remission: Secondary | ICD-10-CM | POA: Diagnosis not present

## 2015-11-27 DIAGNOSIS — E119 Type 2 diabetes mellitus without complications: Secondary | ICD-10-CM | POA: Diagnosis not present

## 2015-11-27 DIAGNOSIS — E039 Hypothyroidism, unspecified: Secondary | ICD-10-CM | POA: Diagnosis not present

## 2015-12-08 DIAGNOSIS — M0589 Other rheumatoid arthritis with rheumatoid factor of multiple sites: Secondary | ICD-10-CM | POA: Diagnosis not present

## 2015-12-08 DIAGNOSIS — Z79899 Other long term (current) drug therapy: Secondary | ICD-10-CM | POA: Diagnosis not present

## 2015-12-08 DIAGNOSIS — M150 Primary generalized (osteo)arthritis: Secondary | ICD-10-CM | POA: Diagnosis not present

## 2015-12-08 DIAGNOSIS — M0609 Rheumatoid arthritis without rheumatoid factor, multiple sites: Secondary | ICD-10-CM | POA: Diagnosis not present

## 2015-12-08 DIAGNOSIS — M05731 Rheumatoid arthritis with rheumatoid factor of right wrist without organ or systems involvement: Secondary | ICD-10-CM | POA: Diagnosis not present

## 2016-01-05 DIAGNOSIS — M0589 Other rheumatoid arthritis with rheumatoid factor of multiple sites: Secondary | ICD-10-CM | POA: Diagnosis not present

## 2016-01-05 DIAGNOSIS — M0609 Rheumatoid arthritis without rheumatoid factor, multiple sites: Secondary | ICD-10-CM | POA: Diagnosis not present

## 2016-01-05 DIAGNOSIS — Z79899 Other long term (current) drug therapy: Secondary | ICD-10-CM | POA: Diagnosis not present

## 2016-01-05 DIAGNOSIS — M810 Age-related osteoporosis without current pathological fracture: Secondary | ICD-10-CM | POA: Diagnosis not present

## 2016-01-05 DIAGNOSIS — E559 Vitamin D deficiency, unspecified: Secondary | ICD-10-CM | POA: Diagnosis not present

## 2016-01-05 DIAGNOSIS — M79642 Pain in left hand: Secondary | ICD-10-CM | POA: Diagnosis not present

## 2016-01-05 DIAGNOSIS — M79641 Pain in right hand: Secondary | ICD-10-CM | POA: Diagnosis not present

## 2016-01-05 DIAGNOSIS — M150 Primary generalized (osteo)arthritis: Secondary | ICD-10-CM | POA: Diagnosis not present

## 2016-01-06 DIAGNOSIS — M79641 Pain in right hand: Secondary | ICD-10-CM | POA: Diagnosis not present

## 2016-01-06 DIAGNOSIS — M79642 Pain in left hand: Secondary | ICD-10-CM | POA: Diagnosis not present

## 2016-01-14 DIAGNOSIS — Z23 Encounter for immunization: Secondary | ICD-10-CM | POA: Diagnosis not present

## 2016-01-28 DIAGNOSIS — Z1211 Encounter for screening for malignant neoplasm of colon: Secondary | ICD-10-CM | POA: Diagnosis not present

## 2016-01-28 DIAGNOSIS — K648 Other hemorrhoids: Secondary | ICD-10-CM | POA: Diagnosis not present

## 2016-01-28 DIAGNOSIS — M069 Rheumatoid arthritis, unspecified: Secondary | ICD-10-CM | POA: Diagnosis not present

## 2016-03-01 DIAGNOSIS — M0609 Rheumatoid arthritis without rheumatoid factor, multiple sites: Secondary | ICD-10-CM | POA: Diagnosis not present

## 2016-03-01 DIAGNOSIS — Z79899 Other long term (current) drug therapy: Secondary | ICD-10-CM | POA: Diagnosis not present

## 2016-03-01 DIAGNOSIS — E559 Vitamin D deficiency, unspecified: Secondary | ICD-10-CM | POA: Diagnosis not present

## 2016-03-01 DIAGNOSIS — M150 Primary generalized (osteo)arthritis: Secondary | ICD-10-CM | POA: Diagnosis not present

## 2016-03-01 DIAGNOSIS — M810 Age-related osteoporosis without current pathological fracture: Secondary | ICD-10-CM | POA: Diagnosis not present

## 2016-03-01 DIAGNOSIS — M0589 Other rheumatoid arthritis with rheumatoid factor of multiple sites: Secondary | ICD-10-CM | POA: Diagnosis not present

## 2016-03-30 DIAGNOSIS — N390 Urinary tract infection, site not specified: Secondary | ICD-10-CM | POA: Diagnosis not present

## 2016-03-30 DIAGNOSIS — M069 Rheumatoid arthritis, unspecified: Secondary | ICD-10-CM | POA: Diagnosis not present

## 2016-03-30 DIAGNOSIS — F324 Major depressive disorder, single episode, in partial remission: Secondary | ICD-10-CM | POA: Diagnosis not present

## 2016-03-30 DIAGNOSIS — E039 Hypothyroidism, unspecified: Secondary | ICD-10-CM | POA: Diagnosis not present

## 2016-03-30 DIAGNOSIS — I10 Essential (primary) hypertension: Secondary | ICD-10-CM | POA: Diagnosis not present

## 2016-03-30 DIAGNOSIS — E119 Type 2 diabetes mellitus without complications: Secondary | ICD-10-CM | POA: Diagnosis not present

## 2016-03-30 DIAGNOSIS — Z794 Long term (current) use of insulin: Secondary | ICD-10-CM | POA: Diagnosis not present

## 2016-03-30 DIAGNOSIS — E785 Hyperlipidemia, unspecified: Secondary | ICD-10-CM | POA: Diagnosis not present

## 2016-04-13 DIAGNOSIS — M0609 Rheumatoid arthritis without rheumatoid factor, multiple sites: Secondary | ICD-10-CM | POA: Diagnosis not present

## 2016-05-25 DIAGNOSIS — M0589 Other rheumatoid arthritis with rheumatoid factor of multiple sites: Secondary | ICD-10-CM | POA: Diagnosis not present

## 2016-05-25 DIAGNOSIS — M05731 Rheumatoid arthritis with rheumatoid factor of right wrist without organ or systems involvement: Secondary | ICD-10-CM | POA: Diagnosis not present

## 2016-05-25 DIAGNOSIS — M810 Age-related osteoporosis without current pathological fracture: Secondary | ICD-10-CM | POA: Diagnosis not present

## 2016-05-25 DIAGNOSIS — Z79899 Other long term (current) drug therapy: Secondary | ICD-10-CM | POA: Diagnosis not present

## 2016-05-25 DIAGNOSIS — E559 Vitamin D deficiency, unspecified: Secondary | ICD-10-CM | POA: Diagnosis not present

## 2016-05-25 DIAGNOSIS — M0609 Rheumatoid arthritis without rheumatoid factor, multiple sites: Secondary | ICD-10-CM | POA: Diagnosis not present

## 2016-05-25 DIAGNOSIS — M150 Primary generalized (osteo)arthritis: Secondary | ICD-10-CM | POA: Diagnosis not present

## 2016-06-30 DIAGNOSIS — M0609 Rheumatoid arthritis without rheumatoid factor, multiple sites: Secondary | ICD-10-CM | POA: Diagnosis not present

## 2016-08-02 DIAGNOSIS — G894 Chronic pain syndrome: Secondary | ICD-10-CM | POA: Diagnosis not present

## 2016-08-02 DIAGNOSIS — Z6827 Body mass index (BMI) 27.0-27.9, adult: Secondary | ICD-10-CM | POA: Diagnosis not present

## 2016-08-02 DIAGNOSIS — E663 Overweight: Secondary | ICD-10-CM | POA: Diagnosis not present

## 2016-08-02 DIAGNOSIS — M810 Age-related osteoporosis without current pathological fracture: Secondary | ICD-10-CM | POA: Diagnosis not present

## 2016-08-02 DIAGNOSIS — R5383 Other fatigue: Secondary | ICD-10-CM | POA: Diagnosis not present

## 2016-08-02 DIAGNOSIS — M0579 Rheumatoid arthritis with rheumatoid factor of multiple sites without organ or systems involvement: Secondary | ICD-10-CM | POA: Diagnosis not present

## 2016-08-02 DIAGNOSIS — M2550 Pain in unspecified joint: Secondary | ICD-10-CM | POA: Diagnosis not present

## 2016-08-10 DIAGNOSIS — M0579 Rheumatoid arthritis with rheumatoid factor of multiple sites without organ or systems involvement: Secondary | ICD-10-CM | POA: Diagnosis not present

## 2016-08-17 DIAGNOSIS — M810 Age-related osteoporosis without current pathological fracture: Secondary | ICD-10-CM | POA: Diagnosis not present

## 2016-08-25 DIAGNOSIS — M0579 Rheumatoid arthritis with rheumatoid factor of multiple sites without organ or systems involvement: Secondary | ICD-10-CM | POA: Diagnosis not present

## 2016-09-08 DIAGNOSIS — M0579 Rheumatoid arthritis with rheumatoid factor of multiple sites without organ or systems involvement: Secondary | ICD-10-CM | POA: Diagnosis not present

## 2016-09-27 DIAGNOSIS — F324 Major depressive disorder, single episode, in partial remission: Secondary | ICD-10-CM | POA: Diagnosis not present

## 2016-09-27 DIAGNOSIS — E1165 Type 2 diabetes mellitus with hyperglycemia: Secondary | ICD-10-CM | POA: Diagnosis not present

## 2016-09-27 DIAGNOSIS — E039 Hypothyroidism, unspecified: Secondary | ICD-10-CM | POA: Diagnosis not present

## 2016-09-27 DIAGNOSIS — Z7984 Long term (current) use of oral hypoglycemic drugs: Secondary | ICD-10-CM | POA: Diagnosis not present

## 2016-09-27 DIAGNOSIS — E119 Type 2 diabetes mellitus without complications: Secondary | ICD-10-CM | POA: Diagnosis not present

## 2016-09-27 DIAGNOSIS — M069 Rheumatoid arthritis, unspecified: Secondary | ICD-10-CM | POA: Diagnosis not present

## 2016-09-27 DIAGNOSIS — G894 Chronic pain syndrome: Secondary | ICD-10-CM | POA: Diagnosis not present

## 2016-09-27 DIAGNOSIS — I10 Essential (primary) hypertension: Secondary | ICD-10-CM | POA: Diagnosis not present

## 2016-09-27 DIAGNOSIS — E785 Hyperlipidemia, unspecified: Secondary | ICD-10-CM | POA: Diagnosis not present

## 2016-09-27 DIAGNOSIS — K219 Gastro-esophageal reflux disease without esophagitis: Secondary | ICD-10-CM | POA: Diagnosis not present

## 2016-10-04 DIAGNOSIS — Z6827 Body mass index (BMI) 27.0-27.9, adult: Secondary | ICD-10-CM | POA: Diagnosis not present

## 2016-10-04 DIAGNOSIS — M810 Age-related osteoporosis without current pathological fracture: Secondary | ICD-10-CM | POA: Diagnosis not present

## 2016-10-04 DIAGNOSIS — E663 Overweight: Secondary | ICD-10-CM | POA: Diagnosis not present

## 2016-10-04 DIAGNOSIS — M0579 Rheumatoid arthritis with rheumatoid factor of multiple sites without organ or systems involvement: Secondary | ICD-10-CM | POA: Diagnosis not present

## 2016-10-04 DIAGNOSIS — G894 Chronic pain syndrome: Secondary | ICD-10-CM | POA: Diagnosis not present

## 2016-10-04 DIAGNOSIS — Z79899 Other long term (current) drug therapy: Secondary | ICD-10-CM | POA: Diagnosis not present

## 2016-10-04 DIAGNOSIS — M2550 Pain in unspecified joint: Secondary | ICD-10-CM | POA: Diagnosis not present

## 2016-10-11 DIAGNOSIS — M0579 Rheumatoid arthritis with rheumatoid factor of multiple sites without organ or systems involvement: Secondary | ICD-10-CM | POA: Diagnosis not present

## 2016-11-08 DIAGNOSIS — M0579 Rheumatoid arthritis with rheumatoid factor of multiple sites without organ or systems involvement: Secondary | ICD-10-CM | POA: Diagnosis not present

## 2016-12-06 DIAGNOSIS — M0579 Rheumatoid arthritis with rheumatoid factor of multiple sites without organ or systems involvement: Secondary | ICD-10-CM | POA: Diagnosis not present

## 2016-12-06 DIAGNOSIS — Z79899 Other long term (current) drug therapy: Secondary | ICD-10-CM | POA: Diagnosis not present

## 2016-12-14 DIAGNOSIS — M0579 Rheumatoid arthritis with rheumatoid factor of multiple sites without organ or systems involvement: Secondary | ICD-10-CM | POA: Diagnosis not present

## 2016-12-14 DIAGNOSIS — G5601 Carpal tunnel syndrome, right upper limb: Secondary | ICD-10-CM | POA: Diagnosis not present

## 2016-12-14 DIAGNOSIS — Z6827 Body mass index (BMI) 27.0-27.9, adult: Secondary | ICD-10-CM | POA: Diagnosis not present

## 2016-12-14 DIAGNOSIS — M2550 Pain in unspecified joint: Secondary | ICD-10-CM | POA: Diagnosis not present

## 2016-12-14 DIAGNOSIS — Z79899 Other long term (current) drug therapy: Secondary | ICD-10-CM | POA: Diagnosis not present

## 2016-12-14 DIAGNOSIS — M25531 Pain in right wrist: Secondary | ICD-10-CM | POA: Diagnosis not present

## 2016-12-14 DIAGNOSIS — E663 Overweight: Secondary | ICD-10-CM | POA: Diagnosis not present

## 2016-12-14 DIAGNOSIS — G894 Chronic pain syndrome: Secondary | ICD-10-CM | POA: Diagnosis not present

## 2016-12-29 DIAGNOSIS — E119 Type 2 diabetes mellitus without complications: Secondary | ICD-10-CM | POA: Diagnosis not present

## 2016-12-29 DIAGNOSIS — G894 Chronic pain syndrome: Secondary | ICD-10-CM | POA: Diagnosis not present

## 2016-12-29 DIAGNOSIS — Z23 Encounter for immunization: Secondary | ICD-10-CM | POA: Diagnosis not present

## 2016-12-29 DIAGNOSIS — R011 Cardiac murmur, unspecified: Secondary | ICD-10-CM | POA: Diagnosis not present

## 2016-12-29 DIAGNOSIS — I10 Essential (primary) hypertension: Secondary | ICD-10-CM | POA: Diagnosis not present

## 2016-12-29 DIAGNOSIS — M069 Rheumatoid arthritis, unspecified: Secondary | ICD-10-CM | POA: Diagnosis not present

## 2016-12-29 DIAGNOSIS — K219 Gastro-esophageal reflux disease without esophagitis: Secondary | ICD-10-CM | POA: Diagnosis not present

## 2016-12-29 DIAGNOSIS — E785 Hyperlipidemia, unspecified: Secondary | ICD-10-CM | POA: Diagnosis not present

## 2016-12-29 DIAGNOSIS — E1165 Type 2 diabetes mellitus with hyperglycemia: Secondary | ICD-10-CM | POA: Diagnosis not present

## 2016-12-29 DIAGNOSIS — F324 Major depressive disorder, single episode, in partial remission: Secondary | ICD-10-CM | POA: Diagnosis not present

## 2016-12-29 DIAGNOSIS — E039 Hypothyroidism, unspecified: Secondary | ICD-10-CM | POA: Diagnosis not present

## 2017-01-03 DIAGNOSIS — M0579 Rheumatoid arthritis with rheumatoid factor of multiple sites without organ or systems involvement: Secondary | ICD-10-CM | POA: Diagnosis not present

## 2017-01-31 DIAGNOSIS — Z79899 Other long term (current) drug therapy: Secondary | ICD-10-CM | POA: Diagnosis not present

## 2017-01-31 DIAGNOSIS — M0579 Rheumatoid arthritis with rheumatoid factor of multiple sites without organ or systems involvement: Secondary | ICD-10-CM | POA: Diagnosis not present

## 2017-02-21 DIAGNOSIS — M810 Age-related osteoporosis without current pathological fracture: Secondary | ICD-10-CM | POA: Diagnosis not present

## 2017-02-28 DIAGNOSIS — M0579 Rheumatoid arthritis with rheumatoid factor of multiple sites without organ or systems involvement: Secondary | ICD-10-CM | POA: Diagnosis not present

## 2017-03-14 DIAGNOSIS — Z6827 Body mass index (BMI) 27.0-27.9, adult: Secondary | ICD-10-CM | POA: Diagnosis not present

## 2017-03-14 DIAGNOSIS — M0579 Rheumatoid arthritis with rheumatoid factor of multiple sites without organ or systems involvement: Secondary | ICD-10-CM | POA: Diagnosis not present

## 2017-03-14 DIAGNOSIS — M2550 Pain in unspecified joint: Secondary | ICD-10-CM | POA: Diagnosis not present

## 2017-03-14 DIAGNOSIS — E663 Overweight: Secondary | ICD-10-CM | POA: Diagnosis not present

## 2017-03-14 DIAGNOSIS — M810 Age-related osteoporosis without current pathological fracture: Secondary | ICD-10-CM | POA: Diagnosis not present

## 2017-03-14 DIAGNOSIS — Z79899 Other long term (current) drug therapy: Secondary | ICD-10-CM | POA: Diagnosis not present

## 2017-03-28 DIAGNOSIS — M0579 Rheumatoid arthritis with rheumatoid factor of multiple sites without organ or systems involvement: Secondary | ICD-10-CM | POA: Diagnosis not present

## 2017-03-28 DIAGNOSIS — Z79899 Other long term (current) drug therapy: Secondary | ICD-10-CM | POA: Diagnosis not present

## 2017-04-04 DIAGNOSIS — M069 Rheumatoid arthritis, unspecified: Secondary | ICD-10-CM | POA: Diagnosis not present

## 2017-04-04 DIAGNOSIS — E039 Hypothyroidism, unspecified: Secondary | ICD-10-CM | POA: Diagnosis not present

## 2017-04-04 DIAGNOSIS — E119 Type 2 diabetes mellitus without complications: Secondary | ICD-10-CM | POA: Diagnosis not present

## 2017-04-04 DIAGNOSIS — G894 Chronic pain syndrome: Secondary | ICD-10-CM | POA: Diagnosis not present

## 2017-04-04 DIAGNOSIS — I10 Essential (primary) hypertension: Secondary | ICD-10-CM | POA: Diagnosis not present

## 2017-04-04 DIAGNOSIS — E7800 Pure hypercholesterolemia, unspecified: Secondary | ICD-10-CM | POA: Diagnosis not present

## 2017-04-04 DIAGNOSIS — F324 Major depressive disorder, single episode, in partial remission: Secondary | ICD-10-CM | POA: Diagnosis not present

## 2017-04-26 DIAGNOSIS — M0579 Rheumatoid arthritis with rheumatoid factor of multiple sites without organ or systems involvement: Secondary | ICD-10-CM | POA: Diagnosis not present

## 2017-05-25 DIAGNOSIS — M0579 Rheumatoid arthritis with rheumatoid factor of multiple sites without organ or systems involvement: Secondary | ICD-10-CM | POA: Diagnosis not present

## 2017-06-22 DIAGNOSIS — M0579 Rheumatoid arthritis with rheumatoid factor of multiple sites without organ or systems involvement: Secondary | ICD-10-CM | POA: Diagnosis not present

## 2017-06-22 DIAGNOSIS — Z79899 Other long term (current) drug therapy: Secondary | ICD-10-CM | POA: Diagnosis not present

## 2017-07-11 DIAGNOSIS — E7800 Pure hypercholesterolemia, unspecified: Secondary | ICD-10-CM | POA: Diagnosis not present

## 2017-07-11 DIAGNOSIS — C50912 Malignant neoplasm of unspecified site of left female breast: Secondary | ICD-10-CM | POA: Diagnosis not present

## 2017-07-11 DIAGNOSIS — Z7984 Long term (current) use of oral hypoglycemic drugs: Secondary | ICD-10-CM | POA: Diagnosis not present

## 2017-07-11 DIAGNOSIS — E119 Type 2 diabetes mellitus without complications: Secondary | ICD-10-CM | POA: Diagnosis not present

## 2017-07-11 DIAGNOSIS — M069 Rheumatoid arthritis, unspecified: Secondary | ICD-10-CM | POA: Diagnosis not present

## 2017-07-11 DIAGNOSIS — K219 Gastro-esophageal reflux disease without esophagitis: Secondary | ICD-10-CM | POA: Diagnosis not present

## 2017-07-11 DIAGNOSIS — E039 Hypothyroidism, unspecified: Secondary | ICD-10-CM | POA: Diagnosis not present

## 2017-07-11 DIAGNOSIS — G894 Chronic pain syndrome: Secondary | ICD-10-CM | POA: Diagnosis not present

## 2017-07-11 DIAGNOSIS — F324 Major depressive disorder, single episode, in partial remission: Secondary | ICD-10-CM | POA: Diagnosis not present

## 2017-07-11 DIAGNOSIS — Z1211 Encounter for screening for malignant neoplasm of colon: Secondary | ICD-10-CM | POA: Diagnosis not present

## 2017-07-11 DIAGNOSIS — I10 Essential (primary) hypertension: Secondary | ICD-10-CM | POA: Diagnosis not present

## 2017-07-12 DIAGNOSIS — M2550 Pain in unspecified joint: Secondary | ICD-10-CM | POA: Diagnosis not present

## 2017-07-12 DIAGNOSIS — M0579 Rheumatoid arthritis with rheumatoid factor of multiple sites without organ or systems involvement: Secondary | ICD-10-CM | POA: Diagnosis not present

## 2017-07-12 DIAGNOSIS — G894 Chronic pain syndrome: Secondary | ICD-10-CM | POA: Diagnosis not present

## 2017-07-12 DIAGNOSIS — E663 Overweight: Secondary | ICD-10-CM | POA: Diagnosis not present

## 2017-07-12 DIAGNOSIS — G5603 Carpal tunnel syndrome, bilateral upper limbs: Secondary | ICD-10-CM | POA: Diagnosis not present

## 2017-07-12 DIAGNOSIS — Z6827 Body mass index (BMI) 27.0-27.9, adult: Secondary | ICD-10-CM | POA: Diagnosis not present

## 2017-07-12 DIAGNOSIS — Z79899 Other long term (current) drug therapy: Secondary | ICD-10-CM | POA: Diagnosis not present

## 2017-07-18 ENCOUNTER — Encounter: Payer: Self-pay | Admitting: Family Medicine

## 2017-07-19 DIAGNOSIS — M79641 Pain in right hand: Secondary | ICD-10-CM | POA: Diagnosis not present

## 2017-07-19 DIAGNOSIS — M069 Rheumatoid arthritis, unspecified: Secondary | ICD-10-CM | POA: Diagnosis not present

## 2017-07-19 DIAGNOSIS — G5602 Carpal tunnel syndrome, left upper limb: Secondary | ICD-10-CM | POA: Diagnosis not present

## 2017-07-19 DIAGNOSIS — G5601 Carpal tunnel syndrome, right upper limb: Secondary | ICD-10-CM | POA: Diagnosis not present

## 2017-07-20 DIAGNOSIS — M0689 Other specified rheumatoid arthritis, multiple sites: Secondary | ICD-10-CM | POA: Diagnosis not present

## 2017-07-20 DIAGNOSIS — Z79899 Other long term (current) drug therapy: Secondary | ICD-10-CM | POA: Diagnosis not present

## 2017-07-20 DIAGNOSIS — M0579 Rheumatoid arthritis with rheumatoid factor of multiple sites without organ or systems involvement: Secondary | ICD-10-CM | POA: Diagnosis not present

## 2017-08-17 DIAGNOSIS — M0579 Rheumatoid arthritis with rheumatoid factor of multiple sites without organ or systems involvement: Secondary | ICD-10-CM | POA: Diagnosis not present

## 2017-08-22 DIAGNOSIS — M810 Age-related osteoporosis without current pathological fracture: Secondary | ICD-10-CM | POA: Diagnosis not present

## 2017-08-31 DIAGNOSIS — H524 Presbyopia: Secondary | ICD-10-CM | POA: Diagnosis not present

## 2017-08-31 DIAGNOSIS — E119 Type 2 diabetes mellitus without complications: Secondary | ICD-10-CM | POA: Diagnosis not present

## 2017-08-31 DIAGNOSIS — H401233 Low-tension glaucoma, bilateral, severe stage: Secondary | ICD-10-CM | POA: Diagnosis not present

## 2017-09-14 DIAGNOSIS — M0579 Rheumatoid arthritis with rheumatoid factor of multiple sites without organ or systems involvement: Secondary | ICD-10-CM | POA: Diagnosis not present

## 2017-09-14 DIAGNOSIS — Z79899 Other long term (current) drug therapy: Secondary | ICD-10-CM | POA: Diagnosis not present

## 2017-09-21 DIAGNOSIS — G5603 Carpal tunnel syndrome, bilateral upper limbs: Secondary | ICD-10-CM | POA: Diagnosis not present

## 2017-09-21 DIAGNOSIS — M79642 Pain in left hand: Secondary | ICD-10-CM | POA: Diagnosis not present

## 2017-09-21 DIAGNOSIS — M069 Rheumatoid arthritis, unspecified: Secondary | ICD-10-CM | POA: Diagnosis present

## 2017-09-21 DIAGNOSIS — M79641 Pain in right hand: Secondary | ICD-10-CM | POA: Diagnosis not present

## 2017-10-12 DIAGNOSIS — E039 Hypothyroidism, unspecified: Secondary | ICD-10-CM | POA: Diagnosis not present

## 2017-10-12 DIAGNOSIS — F324 Major depressive disorder, single episode, in partial remission: Secondary | ICD-10-CM | POA: Diagnosis not present

## 2017-10-12 DIAGNOSIS — G894 Chronic pain syndrome: Secondary | ICD-10-CM | POA: Diagnosis not present

## 2017-10-12 DIAGNOSIS — E119 Type 2 diabetes mellitus without complications: Secondary | ICD-10-CM | POA: Diagnosis not present

## 2017-10-12 DIAGNOSIS — M0579 Rheumatoid arthritis with rheumatoid factor of multiple sites without organ or systems involvement: Secondary | ICD-10-CM | POA: Diagnosis not present

## 2017-10-12 DIAGNOSIS — E7800 Pure hypercholesterolemia, unspecified: Secondary | ICD-10-CM | POA: Diagnosis not present

## 2017-10-12 DIAGNOSIS — M069 Rheumatoid arthritis, unspecified: Secondary | ICD-10-CM | POA: Diagnosis not present

## 2017-10-12 DIAGNOSIS — I10 Essential (primary) hypertension: Secondary | ICD-10-CM | POA: Diagnosis not present

## 2017-10-24 DIAGNOSIS — M0579 Rheumatoid arthritis with rheumatoid factor of multiple sites without organ or systems involvement: Secondary | ICD-10-CM | POA: Diagnosis not present

## 2017-10-24 DIAGNOSIS — E663 Overweight: Secondary | ICD-10-CM | POA: Diagnosis not present

## 2017-10-24 DIAGNOSIS — Z79899 Other long term (current) drug therapy: Secondary | ICD-10-CM | POA: Diagnosis not present

## 2017-10-24 DIAGNOSIS — Z6826 Body mass index (BMI) 26.0-26.9, adult: Secondary | ICD-10-CM | POA: Diagnosis not present

## 2017-10-24 DIAGNOSIS — G894 Chronic pain syndrome: Secondary | ICD-10-CM | POA: Diagnosis not present

## 2017-10-24 DIAGNOSIS — M2550 Pain in unspecified joint: Secondary | ICD-10-CM | POA: Diagnosis not present

## 2017-11-09 DIAGNOSIS — Z79899 Other long term (current) drug therapy: Secondary | ICD-10-CM | POA: Diagnosis not present

## 2017-11-09 DIAGNOSIS — M0579 Rheumatoid arthritis with rheumatoid factor of multiple sites without organ or systems involvement: Secondary | ICD-10-CM | POA: Diagnosis not present

## 2017-12-14 DIAGNOSIS — M0579 Rheumatoid arthritis with rheumatoid factor of multiple sites without organ or systems involvement: Secondary | ICD-10-CM | POA: Diagnosis not present

## 2018-01-11 DIAGNOSIS — M0579 Rheumatoid arthritis with rheumatoid factor of multiple sites without organ or systems involvement: Secondary | ICD-10-CM | POA: Diagnosis not present

## 2018-01-16 DIAGNOSIS — L309 Dermatitis, unspecified: Secondary | ICD-10-CM | POA: Diagnosis not present

## 2018-01-16 DIAGNOSIS — F324 Major depressive disorder, single episode, in partial remission: Secondary | ICD-10-CM | POA: Diagnosis not present

## 2018-01-16 DIAGNOSIS — G894 Chronic pain syndrome: Secondary | ICD-10-CM | POA: Diagnosis not present

## 2018-01-16 DIAGNOSIS — E1165 Type 2 diabetes mellitus with hyperglycemia: Secondary | ICD-10-CM | POA: Diagnosis not present

## 2018-01-16 DIAGNOSIS — Z23 Encounter for immunization: Secondary | ICD-10-CM | POA: Diagnosis not present

## 2018-01-16 DIAGNOSIS — E039 Hypothyroidism, unspecified: Secondary | ICD-10-CM | POA: Diagnosis not present

## 2018-01-16 DIAGNOSIS — E7800 Pure hypercholesterolemia, unspecified: Secondary | ICD-10-CM | POA: Diagnosis not present

## 2018-01-16 DIAGNOSIS — I10 Essential (primary) hypertension: Secondary | ICD-10-CM | POA: Diagnosis not present

## 2018-01-16 DIAGNOSIS — M069 Rheumatoid arthritis, unspecified: Secondary | ICD-10-CM | POA: Diagnosis not present

## 2018-01-16 DIAGNOSIS — E119 Type 2 diabetes mellitus without complications: Secondary | ICD-10-CM | POA: Diagnosis not present

## 2018-01-19 ENCOUNTER — Encounter (HOSPITAL_COMMUNITY)
Admission: EM | Disposition: A | Discharge status: Home / Self Care | Payer: Self-pay | Source: Home / Self Care | Attending: Internal Medicine

## 2018-01-19 ENCOUNTER — Inpatient Hospital Stay (HOSPITAL_COMMUNITY): Payer: Medicare Other | Admitting: Anesthesiology

## 2018-01-19 ENCOUNTER — Inpatient Hospital Stay (HOSPITAL_COMMUNITY)
Admission: EM | Admit: 2018-01-19 | Discharge: 2018-01-22 | DRG: 854 | Disposition: A | Discharge status: Home / Self Care | Payer: Medicare Other | Attending: Family Medicine | Admitting: Family Medicine

## 2018-01-19 ENCOUNTER — Emergency Department (HOSPITAL_COMMUNITY): Payer: Medicare Other

## 2018-01-19 ENCOUNTER — Inpatient Hospital Stay (HOSPITAL_COMMUNITY): Payer: Medicare Other

## 2018-01-19 ENCOUNTER — Encounter (HOSPITAL_COMMUNITY): Payer: Self-pay | Admitting: *Deleted

## 2018-01-19 DIAGNOSIS — I10 Essential (primary) hypertension: Secondary | ICD-10-CM | POA: Diagnosis present

## 2018-01-19 DIAGNOSIS — Z794 Long term (current) use of insulin: Secondary | ICD-10-CM

## 2018-01-19 DIAGNOSIS — E118 Type 2 diabetes mellitus with unspecified complications: Secondary | ICD-10-CM | POA: Diagnosis not present

## 2018-01-19 DIAGNOSIS — Z903 Acquired absence of stomach [part of]: Secondary | ICD-10-CM | POA: Diagnosis not present

## 2018-01-19 DIAGNOSIS — M1990 Unspecified osteoarthritis, unspecified site: Secondary | ICD-10-CM | POA: Diagnosis present

## 2018-01-19 DIAGNOSIS — K5730 Diverticulosis of large intestine without perforation or abscess without bleeding: Secondary | ICD-10-CM | POA: Diagnosis not present

## 2018-01-19 DIAGNOSIS — E039 Hypothyroidism, unspecified: Secondary | ICD-10-CM | POA: Diagnosis present

## 2018-01-19 DIAGNOSIS — E119 Type 2 diabetes mellitus without complications: Secondary | ICD-10-CM | POA: Diagnosis present

## 2018-01-19 DIAGNOSIS — J45909 Unspecified asthma, uncomplicated: Secondary | ICD-10-CM | POA: Diagnosis present

## 2018-01-19 DIAGNOSIS — N12 Tubulo-interstitial nephritis, not specified as acute or chronic: Secondary | ICD-10-CM | POA: Diagnosis not present

## 2018-01-19 DIAGNOSIS — M069 Rheumatoid arthritis, unspecified: Secondary | ICD-10-CM | POA: Diagnosis present

## 2018-01-19 DIAGNOSIS — Z9049 Acquired absence of other specified parts of digestive tract: Secondary | ICD-10-CM | POA: Diagnosis not present

## 2018-01-19 DIAGNOSIS — N200 Calculus of kidney: Secondary | ICD-10-CM | POA: Diagnosis not present

## 2018-01-19 DIAGNOSIS — Z91013 Allergy to seafood: Secondary | ICD-10-CM

## 2018-01-19 DIAGNOSIS — M810 Age-related osteoporosis without current pathological fracture: Secondary | ICD-10-CM | POA: Diagnosis present

## 2018-01-19 DIAGNOSIS — N132 Hydronephrosis with renal and ureteral calculous obstruction: Secondary | ICD-10-CM | POA: Diagnosis not present

## 2018-01-19 DIAGNOSIS — Z8249 Family history of ischemic heart disease and other diseases of the circulatory system: Secondary | ICD-10-CM

## 2018-01-19 DIAGNOSIS — Z90710 Acquired absence of both cervix and uterus: Secondary | ICD-10-CM | POA: Diagnosis not present

## 2018-01-19 DIAGNOSIS — I080 Rheumatic disorders of both mitral and aortic valves: Secondary | ICD-10-CM | POA: Diagnosis present

## 2018-01-19 DIAGNOSIS — R509 Fever, unspecified: Secondary | ICD-10-CM | POA: Diagnosis not present

## 2018-01-19 DIAGNOSIS — K589 Irritable bowel syndrome without diarrhea: Secondary | ICD-10-CM | POA: Diagnosis present

## 2018-01-19 DIAGNOSIS — N10 Acute tubulo-interstitial nephritis: Secondary | ICD-10-CM

## 2018-01-19 DIAGNOSIS — N201 Calculus of ureter: Secondary | ICD-10-CM | POA: Diagnosis not present

## 2018-01-19 DIAGNOSIS — Z853 Personal history of malignant neoplasm of breast: Secondary | ICD-10-CM | POA: Diagnosis not present

## 2018-01-19 DIAGNOSIS — Z9012 Acquired absence of left breast and nipple: Secondary | ICD-10-CM

## 2018-01-19 DIAGNOSIS — Z87891 Personal history of nicotine dependence: Secondary | ICD-10-CM

## 2018-01-19 DIAGNOSIS — Z8711 Personal history of peptic ulcer disease: Secondary | ICD-10-CM | POA: Diagnosis not present

## 2018-01-19 DIAGNOSIS — Z79890 Hormone replacement therapy (postmenopausal): Secondary | ICD-10-CM | POA: Diagnosis not present

## 2018-01-19 DIAGNOSIS — N111 Chronic obstructive pyelonephritis: Secondary | ICD-10-CM | POA: Diagnosis present

## 2018-01-19 DIAGNOSIS — E739 Lactose intolerance, unspecified: Secondary | ICD-10-CM | POA: Diagnosis present

## 2018-01-19 DIAGNOSIS — A419 Sepsis, unspecified organism: Secondary | ICD-10-CM | POA: Diagnosis not present

## 2018-01-19 DIAGNOSIS — Z833 Family history of diabetes mellitus: Secondary | ICD-10-CM

## 2018-01-19 DIAGNOSIS — E785 Hyperlipidemia, unspecified: Secondary | ICD-10-CM | POA: Diagnosis present

## 2018-01-19 DIAGNOSIS — L8990 Pressure ulcer of unspecified site, unspecified stage: Secondary | ICD-10-CM

## 2018-01-19 DIAGNOSIS — Z880 Allergy status to penicillin: Secondary | ICD-10-CM

## 2018-01-19 DIAGNOSIS — A4159 Other Gram-negative sepsis: Principal | ICD-10-CM | POA: Diagnosis present

## 2018-01-19 DIAGNOSIS — Z888 Allergy status to other drugs, medicaments and biological substances status: Secondary | ICD-10-CM

## 2018-01-19 DIAGNOSIS — Z885 Allergy status to narcotic agent status: Secondary | ICD-10-CM

## 2018-01-19 DIAGNOSIS — R7881 Bacteremia: Secondary | ICD-10-CM | POA: Diagnosis not present

## 2018-01-19 DIAGNOSIS — K439 Ventral hernia without obstruction or gangrene: Secondary | ICD-10-CM | POA: Diagnosis not present

## 2018-01-19 DIAGNOSIS — N390 Urinary tract infection, site not specified: Secondary | ICD-10-CM | POA: Diagnosis not present

## 2018-01-19 HISTORY — PX: CYSTOSCOPY W/ URETERAL STENT PLACEMENT: SHX1429

## 2018-01-19 LAB — BLOOD CULTURE ID PANEL (REFLEXED)
ACINETOBACTER BAUMANNII: NOT DETECTED
CANDIDA ALBICANS: NOT DETECTED
CANDIDA PARAPSILOSIS: NOT DETECTED
CANDIDA TROPICALIS: NOT DETECTED
Candida glabrata: NOT DETECTED
Candida krusei: NOT DETECTED
Carbapenem resistance: NOT DETECTED
ENTEROBACTER CLOACAE COMPLEX: NOT DETECTED
ENTEROCOCCUS SPECIES: NOT DETECTED
Enterobacteriaceae species: DETECTED — AB
Escherichia coli: NOT DETECTED
HAEMOPHILUS INFLUENZAE: NOT DETECTED
Klebsiella oxytoca: NOT DETECTED
Klebsiella pneumoniae: DETECTED — AB
Listeria monocytogenes: NOT DETECTED
NEISSERIA MENINGITIDIS: NOT DETECTED
PROTEUS SPECIES: NOT DETECTED
Pseudomonas aeruginosa: NOT DETECTED
Serratia marcescens: NOT DETECTED
Staphylococcus aureus (BCID): NOT DETECTED
Staphylococcus species: NOT DETECTED
Streptococcus agalactiae: NOT DETECTED
Streptococcus pneumoniae: NOT DETECTED
Streptococcus pyogenes: NOT DETECTED
Streptococcus species: NOT DETECTED

## 2018-01-19 LAB — BASIC METABOLIC PANEL
Anion gap: 15 (ref 5–15)
BUN: 13 mg/dL (ref 8–23)
CHLORIDE: 102 mmol/L (ref 98–111)
CO2: 24 mmol/L (ref 22–32)
CREATININE: 0.69 mg/dL (ref 0.44–1.00)
Calcium: 9.6 mg/dL (ref 8.9–10.3)
GFR calc Af Amer: >60 mL/min (ref >60)
GFR calc non Af Amer: >60 mL/min (ref >60)
GLUCOSE: 256 mg/dL — AB (ref 70–99)
Potassium: 4.4 mmol/L (ref 3.5–5.1)
SODIUM: 141 mmol/L (ref 135–145)

## 2018-01-19 LAB — URINALYSIS, ROUTINE W REFLEX MICROSCOPIC
Bilirubin Urine: NEGATIVE
Glucose, UA: >=500 mg/dL — AB
Ketones, ur: NEGATIVE mg/dL
Nitrite: NEGATIVE
PROTEIN: 30 mg/dL — AB
SPECIFIC GRAVITY, URINE: 1.012 (ref 1.005–1.030)
pH: 5 (ref 5.0–8.0)

## 2018-01-19 LAB — GLUCOSE, CAPILLARY
GLUCOSE-CAPILLARY: 148 mg/dL — AB (ref 70–99)
GLUCOSE-CAPILLARY: 169 mg/dL — AB (ref 70–99)
Glucose-Capillary: 152 mg/dL — ABNORMAL HIGH (ref 70–99)
Glucose-Capillary: 146 mg/dL — ABNORMAL HIGH (ref 70–99)

## 2018-01-19 LAB — I-STAT CG4 LACTIC ACID, ED
LACTIC ACID, VENOUS: 4.08 mmol/L — AB (ref 0.5–1.9)
LACTIC ACID, VENOUS: 3.66 mmol/L — AB (ref 0.5–1.9)

## 2018-01-19 LAB — CBC
HCT: 40 % (ref 36.0–46.0)
Hemoglobin: 13.5 g/dL (ref 12.0–15.0)
MCH: 30.4 pg (ref 26.0–34.0)
MCHC: 33.8 g/dL (ref 30.0–36.0)
MCV: 90.1 fL (ref 78.0–100.0)
PLATELETS: 341 10*3/uL (ref 150–400)
RBC: 4.44 MIL/uL (ref 3.87–5.11)
RDW: 13 % (ref 11.5–15.5)
WBC: 26.8 10*3/uL — ABNORMAL HIGH (ref 4.0–10.5)

## 2018-01-19 LAB — MRSA PCR SCREENING: MRSA by PCR: NEGATIVE

## 2018-01-19 LAB — LACTIC ACID, PLASMA: Lactic Acid, Venous: 4.2 mmol/L (ref 0.5–1.9)

## 2018-01-19 SURGERY — CYSTOSCOPY, WITH RETROGRADE PYELOGRAM AND URETERAL STENT INSERTION
Anesthesia: General | Site: Ureter | Laterality: Left

## 2018-01-19 MED ORDER — SODIUM CHLORIDE 0.9 % IV SOLN
2.0000 g | INTRAVENOUS | Status: DC
  Administered 2018-01-20 – 2018-01-22 (×3): 2 g via INTRAVENOUS
  Filled 2018-01-19 (×3): qty 2

## 2018-01-19 MED ORDER — PROPOFOL 10 MG/ML IV BOLUS
INTRAVENOUS | Status: DC | PRN
  Administered 2018-01-19: 80 mg via INTRAVENOUS

## 2018-01-19 MED ORDER — LACTATED RINGERS IV SOLN
INTRAVENOUS | Status: DC | PRN
  Administered 2018-01-19: 11:00:00 via INTRAVENOUS

## 2018-01-19 MED ORDER — SODIUM CHLORIDE 0.9 % IV SOLN
INTRAVENOUS | Status: DC
  Administered 2018-01-19 – 2018-01-20 (×3): via INTRAVENOUS

## 2018-01-19 MED ORDER — DEXAMETHASONE SODIUM PHOSPHATE 10 MG/ML IJ SOLN
INTRAMUSCULAR | Status: DC | PRN
  Administered 2018-01-19: 10 mg via INTRAVENOUS

## 2018-01-19 MED ORDER — SODIUM CHLORIDE 0.9 % IV BOLUS: 250.0000 mL | Freq: Once | INTRAVENOUS | Status: DC

## 2018-01-19 MED ORDER — ESMOLOL HCL 100 MG/10ML IV SOLN
INTRAVENOUS | Status: DC | PRN
  Administered 2018-01-19 (×3): 20 mg via INTRAVENOUS

## 2018-01-19 MED ORDER — LIDOCAINE 2% (20 MG/ML) 5 ML SYRINGE
INTRAMUSCULAR | Status: DC | PRN
  Administered 2018-01-19: 80 mg via INTRAVENOUS

## 2018-01-19 MED ORDER — LABETALOL HCL 5 MG/ML IV SOLN: 10.0000 mg | INTRAVENOUS | Status: DC | PRN

## 2018-01-19 MED ORDER — HYDROCODONE-ACETAMINOPHEN 10-325 MG PO TABS
1.0000 | ORAL_TABLET | Freq: Four times a day (QID) | ORAL | Status: DC | PRN
  Administered 2018-01-19 – 2018-01-21 (×5): 1 via ORAL
  Filled 2018-01-19 (×5): qty 1

## 2018-01-19 MED ORDER — INSULIN ASPART 100 UNIT/ML ~~LOC~~ SOLN
0.0000 [IU] | Freq: Three times a day (TID) | SUBCUTANEOUS | Status: DC
  Administered 2018-01-19 – 2018-01-20 (×3): 1 [IU] via SUBCUTANEOUS
  Administered 2018-01-20: 2 [IU] via SUBCUTANEOUS
  Administered 2018-01-21: 1 [IU] via SUBCUTANEOUS
  Administered 2018-01-21: 2 [IU] via SUBCUTANEOUS
  Administered 2018-01-22: 1 [IU] via SUBCUTANEOUS

## 2018-01-19 MED ORDER — HYDROXYCHLOROQUINE SULFATE 200 MG PO TABS
400.0000 mg | ORAL_TABLET | Freq: Every day | ORAL | Status: DC
  Administered 2018-01-20 – 2018-01-22 (×3): 400 mg via ORAL
  Filled 2018-01-19 (×3): qty 2

## 2018-01-19 MED ORDER — HYDROMORPHONE HCL 1 MG/ML IJ SOLN
0.5000 mg | Freq: Once | INTRAMUSCULAR | Status: AC
  Administered 2018-01-19: 0.5 mg via INTRAVENOUS
  Filled 2018-01-19: qty 1

## 2018-01-19 MED ORDER — SODIUM CHLORIDE 0.9 % IV BOLUS
1000.0000 mL | Freq: Once | INTRAVENOUS | Status: AC
  Administered 2018-01-19: 1000 mL via INTRAVENOUS

## 2018-01-19 MED ORDER — OXYCODONE HCL ER 15 MG PO T12A
30.0000 mg | EXTENDED_RELEASE_TABLET | Freq: Two times a day (BID) | ORAL | Status: DC
  Administered 2018-01-19 – 2018-01-22 (×6): 30 mg via ORAL
  Filled 2018-01-19 (×6): qty 2

## 2018-01-19 MED ORDER — LABETALOL HCL 5 MG/ML IV SOLN
INTRAVENOUS | Status: AC
  Filled 2018-01-19: qty 4

## 2018-01-19 MED ORDER — FENTANYL CITRATE (PF) 100 MCG/2ML IJ SOLN
INTRAMUSCULAR | Status: AC
  Filled 2018-01-19: qty 2

## 2018-01-19 MED ORDER — IOHEXOL 300 MG/ML  SOLN
INTRAMUSCULAR | Status: DC | PRN
  Administered 2018-01-19: 9 mL

## 2018-01-19 MED ORDER — HYDROMORPHONE HCL 1 MG/ML IJ SOLN
1.0000 mg | Freq: Once | INTRAMUSCULAR | Status: AC
  Administered 2018-01-19: 1 mg via INTRAVENOUS
  Filled 2018-01-19: qty 1

## 2018-01-19 MED ORDER — SUCCINYLCHOLINE CHLORIDE 200 MG/10ML IV SOSY
PREFILLED_SYRINGE | INTRAVENOUS | Status: DC | PRN
  Administered 2018-01-19: 140 mg via INTRAVENOUS

## 2018-01-19 MED ORDER — PANTOPRAZOLE SODIUM 40 MG PO TBEC
40.0000 mg | DELAYED_RELEASE_TABLET | Freq: Every day | ORAL | Status: DC
  Administered 2018-01-19 – 2018-01-22 (×4): 40 mg via ORAL
  Filled 2018-01-19 (×4): qty 1

## 2018-01-19 MED ORDER — SODIUM CHLORIDE 0.9 % IV SOLN
1.0000 g | Freq: Once | INTRAVENOUS | Status: AC
  Administered 2018-01-19: 1 g via INTRAVENOUS
  Filled 2018-01-19: qty 10

## 2018-01-19 MED ORDER — LEVOTHYROXINE SODIUM 100 MCG PO TABS
200.0000 ug | ORAL_TABLET | Freq: Every day | ORAL | Status: DC
  Administered 2018-01-20 – 2018-01-22 (×3): 200 ug via ORAL
  Filled 2018-01-19 (×2): qty 2
  Filled 2018-01-19 (×2): qty 1
  Filled 2018-01-19: qty 2

## 2018-01-19 MED ORDER — PROPOFOL 10 MG/ML IV BOLUS
INTRAVENOUS | Status: AC
  Filled 2018-01-19: qty 20

## 2018-01-19 MED ORDER — FENTANYL CITRATE (PF) 100 MCG/2ML IJ SOLN
INTRAMUSCULAR | Status: DC | PRN
  Administered 2018-01-19 (×2): 50 ug via INTRAVENOUS

## 2018-01-19 MED ORDER — ONDANSETRON HCL 4 MG/2ML IJ SOLN
4.0000 mg | Freq: Once | INTRAMUSCULAR | Status: AC
  Administered 2018-01-19: 4 mg via INTRAVENOUS
  Filled 2018-01-19: qty 2

## 2018-01-19 MED ORDER — SODIUM CHLORIDE 0.9 % IR SOLN
Status: DC | PRN
  Administered 2018-01-19: 3000 mL

## 2018-01-19 MED ORDER — ACETAMINOPHEN 500 MG PO TABS
1000.0000 mg | ORAL_TABLET | Freq: Once | ORAL | Status: AC
  Administered 2018-01-19: 1000 mg via ORAL
  Filled 2018-01-19: qty 2

## 2018-01-19 MED ORDER — ONDANSETRON HCL 4 MG/2ML IJ SOLN
INTRAMUSCULAR | Status: DC | PRN
  Administered 2018-01-19: 4 mg via INTRAVENOUS

## 2018-01-19 MED ORDER — INSULIN GLARGINE 100 UNIT/ML ~~LOC~~ SOLN
12.0000 [IU] | Freq: Every day | SUBCUTANEOUS | Status: DC
  Administered 2018-01-19 – 2018-01-21 (×3): 12 [IU] via SUBCUTANEOUS
  Filled 2018-01-19 (×4): qty 0.12

## 2018-01-19 MED ORDER — DULOXETINE HCL 60 MG PO CPEP
60.0000 mg | ORAL_CAPSULE | Freq: Every day | ORAL | Status: DC
  Administered 2018-01-19 – 2018-01-22 (×4): 60 mg via ORAL
  Filled 2018-01-19: qty 2
  Filled 2018-01-19: qty 1
  Filled 2018-01-19 (×2): qty 2

## 2018-01-19 MED ORDER — PRAVASTATIN SODIUM 40 MG PO TABS
40.0000 mg | ORAL_TABLET | Freq: Every day | ORAL | Status: DC
  Administered 2018-01-20 – 2018-01-22 (×3): 40 mg via ORAL
  Filled 2018-01-19: qty 1
  Filled 2018-01-19 (×2): qty 2

## 2018-01-19 MED ORDER — SODIUM CHLORIDE 0.9 % IV BOLUS: 1000.0000 mL | Freq: Once | INTRAVENOUS | Status: DC

## 2018-01-19 MED ORDER — METOCLOPRAMIDE HCL 5 MG/ML IJ SOLN
10.0000 mg | Freq: Once | INTRAMUSCULAR | Status: AC
  Administered 2018-01-19: 10 mg via INTRAVENOUS
  Filled 2018-01-19: qty 2

## 2018-01-19 SURGICAL SUPPLY — 14 items
BAG URO CATCHER STRL LF (MISCELLANEOUS) ×2 IMPLANT
CATH INTERMIT  6FR 70CM (CATHETERS) IMPLANT
CLOTH BEACON ORANGE TIMEOUT ST (SAFETY) ×2 IMPLANT
GLOVE BIOGEL M 8.0 STRL (GLOVE) ×2 IMPLANT
GOWN STRL REUS W/ TWL XL LVL3 (GOWN DISPOSABLE) ×1 IMPLANT
GOWN STRL REUS W/TWL LRG LVL3 (GOWN DISPOSABLE) ×2 IMPLANT
GOWN STRL REUS W/TWL XL LVL3 (GOWN DISPOSABLE) ×2
GUIDEWIRE ANG ZIPWIRE 038X150 (WIRE) IMPLANT
GUIDEWIRE STR DUAL SENSOR (WIRE) ×2 IMPLANT
MANIFOLD NEPTUNE II (INSTRUMENTS) ×2 IMPLANT
PACK CYSTO (CUSTOM PROCEDURE TRAY) ×2 IMPLANT
STENT URET 6FRX24 CONTOUR (STENTS) ×1 IMPLANT
TRAY FOLEY BAG SILVER LF 14FR (CATHETERS) ×1 IMPLANT
TUBING CONNECTING 10 (TUBING) ×2 IMPLANT

## 2018-01-19 NOTE — Anesthesia Postprocedure Evaluation (Signed)
Anesthesia Post Note  Patient: [name]  Procedure(s) Performed: CYSTOSCOPY WITH RETROGRADE PYELOGRAM/URETERAL STENT PLACEMENT (Left Ureter)     Patient location during evaluation: PACU Anesthesia Type: General Level of consciousness: awake and alert Pain management: pain level controlled Vital Signs Assessment: post-procedure vital signs reviewed and stable Respiratory status: spontaneous breathing, nonlabored ventilation, respiratory function stable and patient connected to nasal cannula oxygen Cardiovascular status: blood pressure returned to baseline and stable Postop Assessment: no apparent nausea or vomiting Anesthetic complications: no    Last Vitals:  Vitals:   01/19/18 1115 01/19/18 1258  BP: (!) 159/82 (!) 154/80  Pulse: (!) 122 (!) 125  Resp:  (!) 30  Temp:  37.1 C  SpO2: 93%     Last Pain:  Vitals:   01/19/18 1148  TempSrc:   PainSc: 10-Worst pain ever                 Azyah Flett

## 2018-01-19 NOTE — Op Note (Signed)
Preop diagnosis: Infected left proximal ureteral stone with sepsis  Postoperative diagnosis: Same  Procedure: Cystoscopy, left retrograde ureteropyelogram, fluoroscopic interpretation, placement of 6 French by 24 cm contour double-J stent without tether  Surgeon: Jisell Majer  Anesthesia: General endotracheal  Complications: None  Specimen: None  Indication: 66 year old female with pyelonephrosis/left proximal ureteral stone with sepsis.  Urologic consultation is requested, and at this point we are proceeding with urgent stent placement to be followed at a later time by definitive stone management.  I discussed the procedure with the patient, as well as risks and complications.  She understands and desires to proceed.  Description of procedure: Patient was properly identified and marked in the holding area.  She was taken to the operating room where general anesthetic was administered.  She was placed in the dorsolithotomy position.  Genitalia and perineum were prepped and draped, proper timeout was performed.  21 French panendoscope was advanced into her bladder which was inspected circumferentially and found to be normal.  Left ureteral orifice was cannulated with a 6 [place] open-ended catheter.  Using Omnipaque, gentle retrograde pyelogram was performed.  This revealed a normal-appearing ureter up to the UPJ where a filling defect was seen in mild to moderate pyelocaliectasis noted.  I then passed a sensor tip guidewire through the open-ended catheter, and remove the catheter.  I then placed using fluoroscopic and cystoscopic guidance a 6 French by 24 cm contour double-J stent.  The string had been removed.  Following adequate appointment in the ureter, the scope was removed.  4 French Foley catheter was then placed and hooked to dependent drainage.  Patient tolerated procedure well.

## 2018-01-19 NOTE — ED Provider Notes (Signed)
[facility] DEPT Provider Note   CSN: [id number] Arrival date & time: 01/19/18  9562     History   Chief Complaint Chief Complaint  Patient presents with  . Back Pain    HPI [name] is a 67 y.o. female.  HPI  [name] is a 67yo female with a history of IDDM, hypertension, hyperlipidemia, left breast cancer, chronic low back pain (takes oxycodone), hypothyroidism who presents to the emergency department for evaluation of back pain.  Patient reports that she developed sudden onset left lower back pain yesterday evening while sitting on the couch watching TV.  The pain radiates to her abdomen and also to the left side of her neck at times.  She has associated nausea with vomiting 3 times overnight.  She reports pain is throbbing, 10/10 in severity and constant.  Movement/palpation does not trigger the pain.  She tried taking some of her oxycodone overnight which did not help with her symptoms. She reports some burning with urination for the past few weeks and got her urine tested by her PCP earlier this week. She denies fever, chills, chest pain, shortness of breath, numbness, weakness, diarrhea, constipation, urinary frequency, hematuria, loss of bowel or bladder control, lightheadedness or syncope. She's able to ambulate independently.   Past Medical History:  Diagnosis Date  . Asthma, mild    Intermittent  . Chronic back pain   . Echocardiogram abnormal 09/2008   With mild aortic valve and mitral valve regurgitation  . Heart murmur   . History of left breast cancer   . Hyperlipidemia   . Hypertension   . Hypothyroidism   . IBS (irritable bowel syndrome)   . Lactose intolerance   . Osteoporosis   . Peptic ulcer disease   . Type II diabetes mellitus ([place])    [facility] 08-2012    There are no active problems to display for this patient.   Past Surgical History:  Procedure Laterality Date  . BREAST IMPLANT REMOVAL     Breast implant  infected removed implant 06/06 infected and removed 11/05  . BREAST LUMPECTOMY  09/99   Left breast  . CHOLECYSTECTOMY    . MASTECTOMY  03/05   Left  . other     Left Arm FX  . OTHER SURGICAL HISTORY     Hysterectomy- Ovaries intact   . OTHER SURGICAL HISTORY     Fracture Left HIp  . PARTIAL GASTRECTOMY       OB History   None      Home Medications    Prior to Admission medications   Medication Sig Start Date End Date Taking? Authorizing Provider  abatacept (ORENCIA) 250 MG injection Inject 250 mg into the vein.   Yes [provider]  amLODipine (NORVASC) 10 MG tablet Take 10 mg by mouth daily.   Yes [provider]  cloNIDine (CATAPRES) 0.3 MG tablet Take 0.3 mg by mouth 2 (two) times daily.   Yes [provider]  diclofenac sodium (VOLTAREN) 1 % GEL Apply 2 g topically as needed (For pain.).   Yes [provider]  DULoxetine (CYMBALTA) 60 MG capsule Take 60 mg by mouth daily.   Yes [provider]  esomeprazole (NEXIUM) 20 MG capsule Take 20 mg by mouth 2 (two) times daily.   Yes [provider]  folic acid (FOLVITE) 1 MG tablet Take 1 mg by mouth daily.   Yes [provider]  HYDROcodone-acetaminophen (NORCO) 10-325 MG tablet Take 1  tablet by mouth every 6 (six) hours as needed (For pain.).   Yes [provider]  hydrocortisone (ANUSOL-HC) 25 MG suppository Place 25 mg rectally 2 (two) times daily.   Yes [provider]  hydroxychloroquine (PLAQUENIL) 200 MG tablet Take 400 mg by mouth daily.   Yes [provider]  insulin aspart (NOVOLOG FLEXPEN) 100 UNIT/ML FlexPen Inject 0-20 Units into the skin See admin instructions. Uses a sliding scale.   Yes [provider]  Insulin Glargine (LANTUS SOLOSTAR) 100 UNIT/ML Solostar Pen Inject 18 Units into the skin daily.   Yes [provider]  irbesartan (AVAPRO) 300 MG tablet Take 300 mg by mouth daily.   Yes [provider]  levothyroxine (SYNTHROID, LEVOTHROID) 200 MCG tablet Take 200 mcg by mouth daily.   Yes [provider]  metFORMIN (GLUCOPHAGE-XR) 500 MG 24 hr tablet Take 2,000 mg by mouth daily.   Yes [provider]  Multiple Vitamin (MULTIVITAMIN WITH MINERALS) TABS tablet Take 1 tablet by mouth daily.   Yes [provider]  ondansetron (ZOFRAN) 8 MG tablet Take 8 mg by mouth every 8 (eight) hours as needed for nausea.   Yes [provider]  ondansetron (ZOFRAN-ODT) 4 MG disintegrating tablet Take 4 mg by mouth every 8 (eight) hours as needed for nausea or vomiting.   Yes [provider]  oxyCODONE (OXYCONTIN) 30 MG 12 hr tablet Take 30 mg by mouth every 12 (twelve) hours.   Yes [provider]  pravastatin (PRAVACHOL) 40 MG tablet Take 40 mg by mouth daily.   Yes [provider]  triamcinolone cream (KENALOG) 0.1 % Apply 1 application topically 2 (two) times daily.   Yes [provider]  valACYclovir (VALTREX) 1000 MG tablet Take 1,000 mg by mouth 3 (three) times daily.   Yes [provider]    Family History Family History  Problem Relation Age of Onset  . Hypertension Father   . Diabetes Father   . Diabetes Sister   . Diabetes Brother     Social History Social History   Tobacco Use  . Smoking status: Former Smoker    Last attempt to quit: 05/12/2000    Years since quitting: 17.7  . Smokeless tobacco: Never Used  Substance Use Topics  . Alcohol use: No  . Drug use: No     Allergies   Amaryl [glimepiride]; Avalide [irbesartan-hydrochlorothiazide]; Avandia [rosiglitazone]; Byetta 10 mcg pen [exenatide]; Codeine; Contrast media [iodinated diagnostic agents]; Demerol [meperidine hcl]; Glucovance [glyburide-metformin]; Lisinopril; Naprosyn [naproxen]; and Penicillins   Review of Systems Review of Systems  Constitutional: Negative for chills and fever.  HENT: Negative for congestion.     Respiratory: Negative for cough and shortness of breath.   Cardiovascular: Negative for chest pain.  Gastrointestinal: Positive for abdominal pain, nausea and vomiting. Negative for blood in stool, constipation and diarrhea.  Genitourinary: Positive for dysuria. Negative for difficulty urinating, frequency and hematuria.  Musculoskeletal: Positive for back pain. Negative for gait problem.  Skin: Negative for rash.  Neurological: Negative for syncope, weakness, light-headedness and numbness.  Psychiatric/Behavioral: Negative for agitation.     Physical Exam Updated Vital Signs BP (!) 175/80   Pulse (!) 126   Temp 98.3 F (36.8 C) (Oral)   Resp (!) 9   SpO2 98%   Physical Exam  Constitutional: She is oriented to person, place, and time. She appears well-developed and well-nourished. No distress.  Non-toxic appearing.   HENT:  Head: Normocephalic and atraumatic.  Mouth/Throat: Oropharynx is clear and moist.  Eyes: Pupils are equal, round, and reactive to light. Conjunctivae are normal. Right eye exhibits no discharge. Left eye exhibits no discharge.  Neck: Normal range of motion. Neck supple.  Cardiovascular: Intact distal pulses.  Murmur (systolic grade 3/6) heard. Tachycardic, regular rhythm.  Pulmonary/Chest: Effort normal and breath sounds normal. No stridor. No respiratory distress. She has no wheezes. She has no rales.  Abdominal: Soft. Bowel sounds are normal. There is no tenderness. There is no guarding.  No pulsatile mass. No CVA tenderness.   Musculoskeletal:  No t-spine or l-spine tenderness. No paraspinal muscle tenderness in the lumbar spine.   Neurological: She is alert and oriented to person, place, and time. Coordination normal.  Sensation to light touch intact in bilateral LE. DP pulses 2+ and symmetric bilaterally. Strength 5/5 in bilateral knee flexion/extension and ankle dorsiflexion/plantar flexion. Gait normal in coordination and balance.   Skin: Skin is  warm and dry. Capillary refill takes less than 2 seconds. She is not diaphoretic.  Psychiatric: She has a normal mood and affect. Her behavior is normal.  Nursing note and vitals reviewed.    ED Treatments / Results  Labs (all labs ordered are listed, but only abnormal results are displayed) Labs Reviewed  CBC - Abnormal; Notable for the following components:      Result Value   WBC 26.8 (*)    All other components within normal limits  BASIC METABOLIC PANEL - Abnormal; Notable for the following components:   Glucose, Bld 256 (*)    All other components within normal limits  URINALYSIS, ROUTINE W REFLEX MICROSCOPIC - Abnormal; Notable for the following components:   APPearance HAZY (*)    Glucose, UA >=500 (*)    Hgb urine dipstick MODERATE (*)    Protein, ur 30 (*)    Leukocytes, UA MODERATE (*)    WBC, UA >50 (*)    Bacteria, UA MANY (*)    All other components within normal limits  I-STAT CG4 LACTIC ACID, ED - Abnormal; Notable for the following components:   Lactic Acid, Venous 4.08 (*)    All other components within normal limits  URINE CULTURE  CULTURE, BLOOD (ROUTINE X 2)  CULTURE, BLOOD (ROUTINE X 2)  I-STAT CG4 LACTIC ACID, ED    EKG None  Radiology Ct Renal Stone Study  Addendum Date: 01/19/2018   ADDENDUM REPORT: 01/19/2018 10:37 ADDENDUM: There is air in the urinary bladder. Question instrumentation as the cause. If patient has not had instrumentation in the bladder, gas-forming organism as the cause must be of concern. Correlation with urinalysis in this regard advised. No intravesical fistula appreciable by CT. Electronically Signed   By: Lowella Grip III M.D.   On: 01/19/2018 10:37   Result Date: 01/19/2018 CLINICAL DATA:  Left flank region pain EXAM: CT ABDOMEN AND PELVIS WITHOUT CONTRAST TECHNIQUE: Multidetector CT imaging of the abdomen and pelvis was performed following the standard protocol without oral or IV contrast. COMPARISON:  March 29, 2005  FINDINGS: Lower chest: There is no edema or consolidation. There are foci coronary artery calcification as well as mitral annulus region calcification. Hepatobiliary: No focal liver lesions are appreciable. Gallbladder is absent. There is no appreciable biliary duct dilatation. Pancreas: Pancreas appears somewhat atrophic. No pancreatic mass or inflammatory focus evident. Spleen: No splenic lesions are evident. Adrenals/Urinary Tract: Adrenals bilaterally appear unremarkable. 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  There is a probable hyperdense cyst in the mid left kidney measuring 1.9 x 1.4 cm. There is a cyst arising eccentrically from the upper pole of the left kidney posteriorly measuring 2.0 x 1.8 cm. There is extensive left renal edema with perinephric fluid and stranding throughout the perinephric fascia on the left. There is moderate hydronephrosis on the left. There is no appreciable hydronephrosis on the right. On the right, there is a nonobstructing calculus in the mid kidney measuring 6 x 5 mm. On the left, there is a calculus in the proximal left ureter slightly beyond the ureteropelvic junction measuring 7 x 7 x 5 mm. No other ureteral calculi are evident. There is air within the urinary bladder. Urinary bladder wall is not appreciably thickened. Stomach/Bowel: There are descending colonic and sigmoid diverticula without diverticulitis. There is postoperative change in stomach region. There is no appreciable bowel wall or mesenteric thickening. No evident bowel obstruction. There is no free air or portal venous air. Vascular/Lymphatic: There is aortoiliac atherosclerosis. No aneurysm evident. Major mesenteric arterial vessels appear patent. There is no adenopathy in the abdomen or pelvis. Reproductive: Uterus is absent.  There is no evident pelvic mass. Other: No periappendiceal region inflammation evident. No evident abscess or  ascites in the abdomen or pelvis. There is a ventral hernia containing fat but no bowel. Musculoskeletal: Postoperative changes noted in the lower lumbar spine and proximal left femur. No blastic or lytic bone lesions. No intramuscular lesions are evident. IMPRESSION: 1. 7 x 7 x 5 mm calculus in the proximal left ureter slightly beyond the ureteropelvic junction on the left causing moderate hydronephrosis. There is extensive left renal edema and perinephric stranding/fluid. 2.  Nonobstructing calculus right kidney measuring 6 x 5 mm. 3. 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 Probable hyperdense cyst mid left kidney measuring 1.9 x 1.4 cm. 4. Left colonic diverticulosis without diverticulitis. No bowel obstruction. No abscess in the abdomen or pelvis. No periappendiceal region inflammatory change. 5. Postoperative change in the region of the stomach. Uterus absent. Gallbladder absent. Postoperative changes noted in proximal left femur and lower lumbar region. 6. Aortoiliac atherosclerosis. There are foci of coronary artery calcification. 7.  Midline ventral hernia containing fat but no bowel. Aortic Atherosclerosis (ICD10-I70.0). Electronically Signed: By: Lowella Grip III M.D. On: 01/19/2018 09:07    Procedures Procedures (including critical care time)  CRITICAL CARE Performed by: Glyn Ade   Total critical care time: 35 minutes  Critical care time was exclusive of separately billable procedures and treating other patients.  Critical care was necessary to treat or prevent imminent or life-threatening deterioration.  Critical care was time spent personally by me on the following activities: development of treatment plan with patient and/or surrogate as well as nursing, discussions with consultants, evaluation of patient's response to treatment, examination of patient, obtaining history from patient or surrogate, ordering and performing treatments and  interventions, ordering and review of laboratory studies, ordering and review of radiographic studies, pulse oximetry and re-evaluation of patient's condition.   Medications Ordered in ED Medications  sodium chloride 0.9 % bolus 1,000 mL (1,000 mLs Intravenous New Bag/Given 01/19/18 0945)  cefTRIAXone (ROCEPHIN) 1 g in sodium chloride 0.9 % 100 mL IVPB (1 g Intravenous New Bag/Given 01/19/18 1020)  sodium chloride 0.9 % bolus 1,000 mL (has no administration in time range)  sodium chloride 0.9 % bolus 250 mL (has no administration in time range)  cefTRIAXone (ROCEPHIN) 1 g in sodium chloride  0.9 % 100 mL IVPB (has no administration in time range)  ondansetron (ZOFRAN) injection 4 mg (4 mg Intravenous Given 01/19/18 0742)  HYDROmorphone (DILAUDID) injection 1 mg (1 mg Intravenous Given 01/19/18 0742)  metoCLOPramide (REGLAN) injection 10 mg (10 mg Intravenous Given 01/19/18 0833)  HYDROmorphone (DILAUDID) injection 0.5 mg (0.5 mg Intravenous Given 01/19/18 0833)  acetaminophen (TYLENOL) tablet 1,000 mg (1,000 mg Oral Given 01/19/18 1020)     Initial Impression / Assessment and Plan / ED Course  I have reviewed the triage vital signs and the nursing notes.  Pertinent labs & imaging results that were available during my care of the patient were reviewed by me and considered in my medical decision making (see chart for details).    Patient will be admitted for sepsis secondary to infected left-sided nephrolithiasis.  Patient started on 60mL/kg fluid bolus, blood cultures drawn and rocephin started. Fever improved with acetaminophen. Discussed this patient with Urologist Dr. Diona Fanti who would like patient NPO with potential surgical intervention later today. Hospitalist Dr. Cruzita Lederer will admit. This was a shared visit with Dr. Maryan Rued who also saw the patient and agrees with admission plan.    Final Clinical Impressions(s) / ED Diagnoses   Final diagnoses:  None    ED Discharge Orders    None         Bernarda Caffey 01/19/18 1843    Blanchie Dessert, MD 01/19/18 2126

## 2018-01-19 NOTE — ED Triage Notes (Signed)
Pt c/o left lower/mid back pain, that started suddenly last night and is progressively getting worse, radiating to abdomen, upper back and neck. Pt also reports nausea and vomiting. Denies fever, urinary difficulties. Pt sts she takes oxycontin for RA pain but it's not helping at all with current symptoms

## 2018-01-19 NOTE — Consult Note (Signed)
Urology Consult  Consulting MD: Duffy Bruce, MD  CC: Infected kidney stone  HPI: This is a 67year old female without prior history of urolithiasis who presented to the hospital today with left flank pain and fever.  She does not have significant history of recurrent urinary tract infections.  There was worry for an infected stone, and CT stone protocol was performed showing a large left upper ureteral stone with hydronephrosis.  She does have a urinary tract infection.  She is immunosuppressed, as she is on Orencia for rheumatoid arthritis.  She is also diabetic.  Because of presence of sepsis, infection and obstructing left ureteral stone, urology is consulted.  PMH: Past Medical History:  Diagnosis Date  . Asthma, mild    Intermittent  . Chronic back pain   . Echocardiogram abnormal 09/2008   With mild aortic valve and mitral valve regurgitation  . Heart murmur   . History of left breast cancer   . Hyperlipidemia   . Hypertension   . Hypothyroidism   . IBS (irritable bowel syndrome)   . Lactose intolerance   . Osteoporosis   . Peptic ulcer disease   . Type II diabetes mellitus ([place])    [facility] 08-2012    PSH: Past Surgical History:  Procedure Laterality Date  . BREAST IMPLANT REMOVAL     Breast implant infected removed implant 06/06 infected and removed 11/05  . BREAST LUMPECTOMY  09/99   Left breast  . CHOLECYSTECTOMY    . MASTECTOMY  03/05   Left  . other     Left Arm FX  . OTHER SURGICAL HISTORY     Hysterectomy- Ovaries intact   . OTHER SURGICAL HISTORY     Fracture Left HIp  . PARTIAL GASTRECTOMY      Allergies: Allergies  Allergen Reactions  . Fish Allergy Anaphylaxis and Hives  . Amaryl [Glimepiride] Itching  . Avalide [Irbesartan-Hydrochlorothiazide] Itching  . Avandia [Rosiglitazone] Itching  . Byetta 10 Mcg Pen [Exenatide] Itching  . Codeine Itching  . Contrast Media [Iodinated Diagnostic Agents] Itching  . Demerol [Meperidine Hcl] Itching  .  Glucovance [Glyburide-Metformin] Itching  . Lisinopril Itching  . Naprosyn [Naproxen] Hives  . Penicillins Itching and Other (See Comments)    Has patient had a PCN reaction causing immediate rash, facial/tongue/throat swelling, SOB or lightheadedness with hypotension: no Has patient had a PCN reaction causing severe rash involving mucus membranes or skin necrosis: no Has patient had a PCN reaction that required hospitalization: yes Has patient had a PCN reaction occurring within the last 10 years: no If all of the above answers are "NO", then may proceed with Cephalosporin use.    Medications:  (Not in a hospital admission)   Social History: Social History   Socioeconomic History  . Marital status: Single    Spouse name: Not on file  . Number of children: Not on file  . Years of education: Not on file  . Highest education level: Not on file  Occupational History  . Not on file  Social Needs  . Financial resource strain: Not on file  . Food insecurity:    Worry: Not on file    Inability: Not on file  . Transportation needs:    Medical: Not on file    Non-medical: Not on file  Tobacco Use  . Smoking status: Former Smoker    Last attempt to quit: 05/12/2000    Years since quitting: 17.7  . Smokeless tobacco: Never Used  Substance and  Sexual Activity  . Alcohol use: No  . Drug use: No  . Sexual activity: Not on file  Lifestyle  . Physical activity:    Days per week: Not on file    Minutes per session: Not on file  . Stress: Not on file  Relationships  . Social connections:    Talks on phone: Not on file    Gets together: Not on file    Attends religious service: Not on file    Active member of club or organization: Not on file    Attends meetings of clubs or organizations: Not on file    Relationship status: Not on file  . Intimate partner violence:    Fear of current or ex partner: Not on file    Emotionally abused: Not on file    Physically abused: Not on  file    Forced sexual activity: Not on file  Other Topics Concern  . Not on file  Social History Narrative  . Not on file    Family History: Family History  Problem Relation Age of Onset  . Hypertension Father   . Diabetes Father   . Diabetes Sister   . Diabetes Brother     Review of Systems: Positive: Fever, chills, flank pain Negative: .  A further 10 point review of systems was negative except what is listed in the HPI.  Physical Exam: @VITALS2 @ General: No acute distress.  Awake. Head:  Normocephalic.  Atraumatic. ENT:  EOMI.  Mucous membranes moist Neck:  Supple.  No lymphadenopathy. CV:  Tachycardic Pulmonary: Equal effort bilaterally.   Abdomen: Left CVA and lower quadrant tenderness. Skin:  Normal turgor.  No visible rash. Extremity: No gross deformity of upper extremities.  No gross deformity of lower extremities. Neurologic: Alert. Appropriate mood.    Studies:  Recent Labs    01/19/18 0748  HGB 13.5  WBC 26.8*  PLT 341    Recent Labs    01/19/18 0748  NA 141  K 4.4  CL 102  CO2 24  BUN 13  CREATININE 0.69  CALCIUM 9.6  GFRNONAA >60  GFRAA >60     No results for input(s): INR, APTT in the last 72 hours.  Invalid input(s): PT   Invalid input(s): ABG  I reviewed the patient's CT images as well as the above labs.  Assessment: Infected, obstructing left ureteral stone with sepsis  Plan: I have recommended urgent cystoscopy, retrograde and left double-J stent placement.  The process/procedure as well as risk and complications have been discussed with the patient and her family.  They understand and desire to proceed.  The patient will be admitted to the hospital service for further management of her sepsis.    [phone]

## 2018-01-19 NOTE — H&P (Signed)
History and Physical    [name] QMV:[id number] DOB: 03-Feb-1951 DOA: 01/19/2018  I have briefly reviewed the patient's prior medical records in [place]  PCP: Shirline Frees, MD  Patient coming from: home  Chief Complaint: back / flank pain  HPI: [name] is a 67 y.o. 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  Patient denies any prior cardiac or lung history, has a remote history of tobacco use but quit 14 years ago.  Currently she denies any chest pain, denies any shortness of breath.  She generally active and able to do all her ADLs without difficulties.  She was diagnosed with rheumatoid arthritis in 2005.  She is also been complaining of lightheadedness since yesterday.  This all appeared all of a sudden and was in her normal state of health prior to this.  In the ED she was found to be septic with fever of 101.8, tachycardia in the 120s, blood work showed a white count of 26k, lactic acid was 4.0 and urinalysis showed evidence of a UTI.  Given flank pain she underwent a CT scan of the abdomen and pelvis which showed a 7 x 7 x 5 mm calculus in the proximal left ureter slightly beyond the UPJ causing hydronephrosis with extensive left renal edema and perinephric stranding.  There was also noted air in the bladder.  Urology was consulted, discussed with Dr. Diona Fanti and patient will be taken to the operating room as soon as possible.  We are asked to admit  Review of Systems: As per HPI otherwise 10 point review of systems negative.   Past Medical History:  Diagnosis Date  . Asthma, mild    Intermittent  . Chronic back pain   . Echocardiogram abnormal 09/2008   With mild aortic valve and mitral valve  regurgitation  . Heart murmur   . History of left breast cancer   . Hyperlipidemia   . Hypertension   . Hypothyroidism   . IBS (irritable bowel syndrome)   . Lactose intolerance   . Osteoporosis   . Peptic ulcer disease   . Type II diabetes mellitus ([place])    [facility] 08-2012    Past Surgical History:  Procedure Laterality Date  . BREAST IMPLANT REMOVAL     Breast implant infected removed implant 06/06 infected and removed 11/05  . BREAST LUMPECTOMY  09/99   Left breast  . CHOLECYSTECTOMY    . MASTECTOMY  03/05   Left  . other     Left Arm FX  . OTHER SURGICAL HISTORY     Hysterectomy- Ovaries intact   . OTHER SURGICAL HISTORY     Fracture Left HIp  . PARTIAL GASTRECTOMY       reports that she quit smoking about 17 years ago. She has never used smokeless tobacco. She reports that she does not drink alcohol or use drugs.  Allergies  Allergen Reactions  . Fish Allergy Anaphylaxis and Hives  . Amaryl [Glimepiride] Itching  . Avalide [Irbesartan-Hydrochlorothiazide] Itching  . Avandia [Rosiglitazone] Itching  . Byetta 10 Mcg Pen [Exenatide] Itching  . Codeine Itching  . Contrast Media [Iodinated Diagnostic Agents] Itching  . Demerol [Meperidine Hcl] Itching  . Glucovance [Glyburide-Metformin] Itching  . Lisinopril Itching  . Naprosyn [Naproxen] Hives  . Penicillins  Itching and Other (See Comments)    Has patient had a PCN reaction causing immediate rash, facial/tongue/throat swelling, SOB or lightheadedness with hypotension: no Has patient had a PCN reaction causing severe rash involving mucus membranes or skin necrosis: no Has patient had a PCN reaction that required hospitalization: yes Has patient had a PCN reaction occurring within the last 10 years: no If all of the above answers are "NO", then may proceed with Cephalosporin use.    Family History  Problem Relation Age of Onset  . Hypertension Father   . Diabetes Father   . Diabetes Sister   . Diabetes Brother      Prior to Admission medications   Medication Sig Start Date End Date Taking? Authorizing Provider  abatacept (ORENCIA) 250 MG injection Inject 250 mg into the vein every 30 (thirty) days.    Yes [provider]  amLODipine (NORVASC) 10 MG tablet Take 10 mg by mouth at bedtime.    Yes [provider]  cloNIDine (CATAPRES) 0.3 MG tablet Take 0.3 mg by mouth 2 (two) times daily.   Yes [provider]  diclofenac sodium (VOLTAREN) 1 % GEL Apply 2 g topically as needed (For pain.).   Yes [provider]  DULoxetine (CYMBALTA) 60 MG capsule Take 60 mg by mouth daily.   Yes [provider]  esomeprazole (NEXIUM) 20 MG capsule Take 40 mg by mouth daily.    Yes [provider]  folic acid (FOLVITE) 1 MG tablet Take 1 mg by mouth daily.   Yes [provider]  HYDROcodone-acetaminophen (NORCO) 10-325 MG tablet Take 1 tablet by mouth every 6 (six) hours as needed (For pain.).   Yes [provider]  hydrocortisone (ANUSOL-HC) 25 MG suppository Place 25 mg rectally 2 (two) times daily as needed for hemorrhoids.    Yes [provider]  hydroxychloroquine (PLAQUENIL) 200 MG tablet Take 400 mg by mouth daily.   Yes [provider]  insulin aspart (NOVOLOG FLEXPEN) 100 UNIT/ML FlexPen Inject 0-20 Units into the skin See admin instructions. Uses a sliding scale.   Yes [provider]  Insulin Glargine (LANTUS SOLOSTAR) 100 UNIT/ML Solostar Pen Inject 16 Units into the skin at bedtime.    Yes [provider]  irbesartan (AVAPRO) 300 MG tablet Take 300 mg by mouth at bedtime.    Yes [provider]  levothyroxine (SYNTHROID, LEVOTHROID) 200 MCG tablet Take 200 mcg by mouth daily.   Yes [provider]  metFORMIN (GLUCOPHAGE-XR) 500 MG 24 hr tablet Take 2,000 mg by mouth daily.   Yes [provider]  Multiple Vitamin (MULTIVITAMIN WITH MINERALS) TABS tablet Take 1 tablet by mouth  daily.   Yes [provider]  ondansetron (ZOFRAN-ODT) 8 MG disintegrating tablet Take 8 mg by mouth every 8 (eight) hours as needed for nausea.   Yes [provider]  oxyCODONE (OXYCONTIN) 30 MG 12 hr tablet Take 30 mg by mouth every 12 (twelve) hours.   Yes [provider]  pravastatin (PRAVACHOL) 40 MG tablet Take 40 mg by mouth daily.   Yes [provider]  valACYclovir (VALTREX) 1000 MG tablet Take 1,000 mg by mouth 3 (three) times daily as needed (For shingles.).    Yes [provider]  triamcinolone cream (KENALOG) 0.1 % Apply 1 application topically 2 (two) times daily.    [provider]    Physical Exam: Vitals:   01/19/18 0830 01/19/18 0954 01/19/18 1002 01/19/18 1015  BP: Marland Kitchen)  175/80   (!) 166/77  Pulse: (!) 126   (!) 128  Resp: (!) 9     Temp:   (!) 101.8 F (38.8 C)   TempSrc:   Rectal   SpO2: 98%   94%  Weight:  68 kg      Constitutional: NAD, calm, comfortable Eyes: PERRL, lids and conjunctivae normal ENMT: Mucous membranes are moist. Neck: normal, supple Respiratory: clear to auscultation bilaterally, no wheezing, no crackles. Normal respiratory effort. No accessory muscle use.  Cardiovascular: Regular rate and rhythm, 3/6 systolic ejection murmur. No extremity edema. 2+ pedal pulses.  Tachycardic Abdomen: Slight left lower quadrant tenderness, no guarding or rebound, no masses palpated. Bowel sounds positive.  Musculoskeletal: no clubbing / cyanosis. Normal muscle tone.  Skin: no rashes seen Neurologic: CN 2-12 grossly intact. Strength 5/5 in all 4.  Psychiatric: Normal judgment and insight. Alert and oriented x 3. Normal mood.    Labs on Admission: I have personally reviewed following labs and imaging studies  CBC: Recent Labs  Lab 01/19/18 0748  WBC 26.8*  HGB 13.5  HCT 40.0  MCV 90.1  PLT 242   Basic Metabolic Panel: Recent Labs  Lab 01/19/18 0748  NA 141  K 4.4  CL 102  CO2 24  GLUCOSE 256*   BUN 13  CREATININE 0.69  CALCIUM 9.6   GFR: CrCl cannot be calculated (Unknown ideal weight.). Liver Function Tests: No results for input(s): AST, ALT, ALKPHOS, BILITOT, PROT, ALBUMIN in the last 168 hours. No results for input(s): LIPASE, AMYLASE in the last 168 hours. No results for input(s): AMMONIA in the last 168 hours. Coagulation Profile: No results for input(s): INR, PROTIME in the last 168 hours. Cardiac Enzymes: No results for input(s): CKTOTAL, CKMB, CKMBINDEX, TROPONINI in the last 168 hours. BNP (last 3 results) No results for input(s): PROBNP in the last 8760 hours. HbA1C: No results for input(s): HGBA1C in the last 72 hours. CBG: No results for input(s): GLUCAP in the last 168 hours. Lipid Profile: No results for input(s): CHOL, HDL, LDLCALC, TRIG, CHOLHDL, LDLDIRECT in the last 72 hours. Thyroid Function Tests: No results for input(s): TSH, T4TOTAL, FREET4, T3FREE, THYROIDAB in the last 72 hours. Anemia Panel: No results for input(s): VITAMINB12, FOLATE, FERRITIN, TIBC, IRON, RETICCTPCT in the last 72 hours. Urine analysis:    Component Value Date/Time   COLORURINE YELLOW 01/19/2018 0748   APPEARANCEUR HAZY (A) 01/19/2018 0748   LABSPEC 1.012 01/19/2018 0748   PHURINE 5.0 01/19/2018 0748   GLUCOSEU >=500 (A) 01/19/2018 0748   HGBUR MODERATE (A) 01/19/2018 0748   BILIRUBINUR NEGATIVE 01/19/2018 0748   KETONESUR NEGATIVE 01/19/2018 0748   PROTEINUR 30 (A) 01/19/2018 0748   NITRITE NEGATIVE 01/19/2018 0748   LEUKOCYTESUR MODERATE (A) 01/19/2018 0748     Radiological Exams on Admission: Ct Renal Stone Study  Addendum Date: 01/19/2018   ADDENDUM REPORT: 01/19/2018 10:37 ADDENDUM: There is air in the urinary bladder. Question instrumentation as the cause. If patient has not had instrumentation in the bladder, gas-forming organism as the cause must be of concern. Correlation with urinalysis in this regard advised. No intravesical fistula appreciable by CT.  Electronically Signed   By: Lowella Grip III M.D.   On: 01/19/2018 10:37   Result Date: 01/19/2018 CLINICAL DATA:  Left flank region pain EXAM: CT ABDOMEN AND PELVIS WITHOUT CONTRAST TECHNIQUE: Multidetector CT imaging of the abdomen and pelvis was performed following the standard protocol without oral or IV contrast. COMPARISON:  March 29, 2005  FINDINGS: Lower chest: There is no edema or consolidation. There are foci coronary artery calcification as well as mitral annulus region calcification. Hepatobiliary: No focal liver lesions are appreciable. Gallbladder is absent. There is no appreciable biliary duct dilatation. Pancreas: Pancreas appears somewhat atrophic. No pancreatic mass or inflammatory focus evident. Spleen: No splenic lesions are evident. Adrenals/Urinary Tract: Adrenals bilaterally appear unremarkable. 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 There is a probable hyperdense cyst in the mid left kidney measuring 1.9 x 1.4 cm. There is a cyst arising eccentrically from the upper pole of the left kidney posteriorly measuring 2.0 x 1.8 cm. There is extensive left renal edema with perinephric fluid and stranding throughout the perinephric fascia on the left. There is moderate hydronephrosis on the left. There is no appreciable hydronephrosis on the right. On the right, there is a nonobstructing calculus in the mid kidney measuring 6 x 5 mm. On the left, there is a calculus in the proximal left ureter slightly beyond the ureteropelvic junction measuring 7 x 7 x 5 mm. No other ureteral calculi are evident. There is air within the urinary bladder. Urinary bladder wall is not appreciably thickened. Stomach/Bowel: There are descending colonic and sigmoid diverticula without diverticulitis. There is postoperative change in stomach region. There is no appreciable bowel wall or mesenteric thickening. No evident bowel obstruction. There is  no free air or portal venous air. Vascular/Lymphatic: There is aortoiliac atherosclerosis. No aneurysm evident. Major mesenteric arterial vessels appear patent. There is no adenopathy in the abdomen or pelvis. Reproductive: Uterus is absent.  There is no evident pelvic mass. Other: No periappendiceal region inflammation evident. No evident abscess or ascites in the abdomen or pelvis. There is a ventral hernia containing fat but no bowel. Musculoskeletal: Postoperative changes noted in the lower lumbar spine and proximal left femur. No blastic or lytic bone lesions. No intramuscular lesions are evident. IMPRESSION: 1. 7 x 7 x 5 mm calculus in the proximal left ureter slightly beyond the ureteropelvic junction on the left causing moderate hydronephrosis. There is extensive left renal edema and perinephric stranding/fluid. 2.  Nonobstructing calculus right kidney measuring 6 x 5 mm. 3. 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 Probable hyperdense cyst mid left kidney measuring 1.9 x 1.4 cm. 4. Left colonic diverticulosis without diverticulitis. No bowel obstruction. No abscess in the abdomen or pelvis. No periappendiceal region inflammatory change. 5. Postoperative change in the region of the stomach. Uterus absent. Gallbladder absent. Postoperative changes noted in proximal left femur and lower lumbar region. 6. Aortoiliac atherosclerosis. There are foci of coronary artery calcification. 7.  Midline ventral hernia containing fat but no bowel. Aortic Atherosclerosis (ICD10-I70.0). Electronically Signed: By: Lowella Grip III M.D. On: 01/19/2018 09:07    EKG: Independently reviewed.  Preop EKG pending, will review when available  Assessment/Plan Active Problems:   Sepsis ([place])   Sepsis due to UTI/obstructive nephrolithiasis -Urology already evaluated patient, she will be taken to the operating room shortly -Admit to stepdown -Very likelihood to be bacteremic and will increase  ceftriaxone to 2 g, blood cultures obtained and will continue to closely monitor  Rheumatoid arthritis -Hold home medications  Hypertension -For now hold BP meds, will monitor and reintroduce postop when sepsis physiology is improving  Diabetes mellitus -Start lower dose of Lantus, placed on sliding scale   DVT prophylaxis: SCDs, postop per urology Code Status: Full code Family Communication: Sister present  at bedside Disposition Plan: Admit to stepdown, likely home when ready Consults called: Urology    Admission status: Inpatient   At the time of admission, it appears that the appropriate admission status for this patient is INPATIENT. This is judged to be reasonable and necessary in order to provide the required high service intensity to ensure the patient's safety given the presenting symptoms, physical exam findings, and initial radiographic and laboratory data in the context of their chronic comorbidities. Current circumstances are sepsis, going to OR, and it is felt to place patient at high risk for further clinical deterioration threatening life, limb, or organ. Moreover, it is my clinical judgment that the patient will require inpatient hospital care spanning beyond 2 midnights from the point of admission and that early discharge would result in unnecessary risk of decompensation and readmission or threat to life, limb or bodily function.   Marzetta Board, MD [facility] Pager [phone]  If 7PM-7AM, please contact night-coverage [email or web address] Password [facility]  01/19/2018, 10:49 AM

## 2018-01-19 NOTE — Anesthesia Preprocedure Evaluation (Addendum)
Anesthesia Evaluation  Patient identified by MRN, date of birth, ID band Patient awake    Reviewed: Allergy & Precautions, H&P , NPO status , Patient's Chart, lab work & pertinent test results, reviewed documented beta blocker date and time   Airway Mallampati: II  TM Distance: >3 FB Neck ROM: full    Dental no notable dental hx.    Pulmonary neg pulmonary ROS, former smoker,    Pulmonary exam normal breath sounds clear to auscultation       Cardiovascular Exercise Tolerance: Good hypertension, negative cardio ROS  + Valvular Problems/Murmurs MR  Rhythm:regular Rate:Normal     Neuro/Psych negative neurological ROS  negative psych ROS   GI/Hepatic negative GI ROS, Neg liver ROS,   Endo/Other  diabetes, Insulin DependentHypothyroidism   Renal/GU negative Renal ROS  negative genitourinary   Musculoskeletal   Abdominal   Peds  Hematology negative hematology ROS (+)   Anesthesia Other Findings   Reproductive/Obstetrics negative OB ROS                            Anesthesia Physical Anesthesia Plan  ASA: III and emergent  Anesthesia Plan: General   Post-op Pain Management:    Induction: Intravenous  PONV Risk Score and Plan: 3 and Ondansetron, Dexamethasone and Treatment may vary due to age or medical condition  Airway Management Planned: LMA and Oral ETT  Additional Equipment:   Intra-op Plan:   Post-operative Plan: Extubation in OR  Informed Consent: I have reviewed the patients History and Physical, chart, labs and discussed the procedure including the risks, benefits and alternatives for the proposed anesthesia with the patient or authorized representative who has indicated his/her understanding and acceptance.   Dental Advisory Given  Plan Discussed with: CRNA, Anesthesiologist and Surgeon  Anesthesia Plan Comments: ( )       Anesthesia Quick Evaluation

## 2018-01-19 NOTE — Transfer of Care (Signed)
Immediate Anesthesia Transfer of Care Note  Patient: [name]  Procedure(s) Performed: Procedure(s): CYSTOSCOPY WITH RETROGRADE PYELOGRAM/URETERAL STENT PLACEMENT (Left)  Patient Location: PACU  Anesthesia Type:General  Level of Consciousness:  sedated, patient cooperative and responds to stimulation  Airway & Oxygen Therapy:Patient Spontanous Breathing and Patient connected to face mask oxgen  Post-op Assessment:  Report given to PACU RN and Post -op Vital signs reviewed and stable  Post vital signs:  Reviewed and stable  Last Vitals:  Vitals:   01/19/18 1100 01/19/18 1115  BP: (!) 157/80 (!) 159/82  Pulse: (!) 121 (!) 122  Resp:    Temp:    SpO2: 85% 48%    Complications: No apparent anesthesia complications

## 2018-01-19 NOTE — Anesthesia Procedure Notes (Signed)
Procedure Name: Intubation Date/Time: 01/19/2018 12:29 PM Performed by: Victoriano Lain, CRNA Pre-anesthesia Checklist: Patient identified, Emergency Drugs available, Suction available, Patient being monitored and Timeout performed Patient Re-evaluated:Patient Re-evaluated prior to induction Oxygen Delivery Method: Circle system utilized Preoxygenation: Pre-oxygenation with 100% oxygen Induction Type: Rapid sequence, Cricoid Pressure applied and IV induction Laryngoscope Size: Glidescope and 3 Grade View: Grade I Tube type: Parker flex tip Tube size: 7.0 mm Placement Confirmation: ETT inserted through vocal cords under direct vision,  positive ETCO2 and breath sounds checked- equal and bilateral Secured at: 22 cm Tube secured with: Tape Dental Injury: Teeth and Oropharynx as per pre-operative assessment  Comments: Smooth IV induction. RSI with cricoid pressure by Dr. Ambrose Pancoast. Grade 1 view with Glidescope #3. BBS =, ATOI. Elective Glidescope due to small mouth opening.

## 2018-01-19 NOTE — Progress Notes (Signed)
PHARMACY - PHYSICIAN COMMUNICATION CRITICAL VALUE ALERT - BLOOD CULTURE IDENTIFICATION (BCID)  [name] is an 67 y.o. female who presented to [facility] on 01/19/2018 with a chief complaint of back/flank pain.   Assessment: sepsis 2/2 UTI, infected, obstructing left ureteral stone   Name of physician (or Provider) Contacted: Lovey Newcomer, NP   Current antibiotics: Ceftriaxone 2g IV q24h  Changes to prescribed antibiotics recommended:  Patient is on recommended antibiotics - No changes needed  Results for orders placed or performed during the hospital encounter of 01/19/18  Blood Culture ID Panel (Reflexed) (Collected: 01/19/2018  9:15 AM)  Result Value Ref Range   Enterococcus species NOT DETECTED NOT DETECTED   Listeria monocytogenes NOT DETECTED NOT DETECTED   Staphylococcus species NOT DETECTED NOT DETECTED   Staphylococcus aureus NOT DETECTED NOT DETECTED   Streptococcus species NOT DETECTED NOT DETECTED   Streptococcus agalactiae NOT DETECTED NOT DETECTED   Streptococcus pneumoniae NOT DETECTED NOT DETECTED   Streptococcus pyogenes NOT DETECTED NOT DETECTED   Acinetobacter baumannii NOT DETECTED NOT DETECTED   Enterobacteriaceae species DETECTED (A) NOT DETECTED   Enterobacter cloacae complex NOT DETECTED NOT DETECTED   Escherichia coli NOT DETECTED NOT DETECTED   Klebsiella oxytoca NOT DETECTED NOT DETECTED   Klebsiella pneumoniae DETECTED (A) NOT DETECTED   Proteus species NOT DETECTED NOT DETECTED   Serratia marcescens NOT DETECTED NOT DETECTED   Carbapenem resistance NOT DETECTED NOT DETECTED   Haemophilus influenzae NOT DETECTED NOT DETECTED   Neisseria meningitidis NOT DETECTED NOT DETECTED   Pseudomonas aeruginosa NOT DETECTED NOT DETECTED   Candida albicans NOT DETECTED NOT DETECTED   Candida glabrata NOT DETECTED NOT DETECTED   Candida krusei NOT DETECTED NOT DETECTED   Candida parapsilosis NOT DETECTED NOT DETECTED   Candida tropicalis NOT DETECTED NOT  DETECTED    Luiz Ochoa 01/19/2018  8:59 PM

## 2018-01-20 ENCOUNTER — Encounter (HOSPITAL_COMMUNITY): Payer: Self-pay | Admitting: Urology

## 2018-01-20 ENCOUNTER — Other Ambulatory Visit: Payer: Self-pay

## 2018-01-20 DIAGNOSIS — N200 Calculus of kidney: Secondary | ICD-10-CM

## 2018-01-20 DIAGNOSIS — N10 Acute tubulo-interstitial nephritis: Secondary | ICD-10-CM

## 2018-01-20 LAB — COMPREHENSIVE METABOLIC PANEL
ALK PHOS: 41 U/L (ref 38–126)
ALT: 133 U/L — AB (ref 0–44)
AST: 87 U/L — ABNORMAL HIGH (ref 15–41)
Albumin: 2.8 g/dL — ABNORMAL LOW (ref 3.5–5.0)
Anion gap: 10 (ref 5–15)
BILIRUBIN TOTAL: 1 mg/dL (ref 0.3–1.2)
BUN: 13 mg/dL (ref 8–23)
CALCIUM: 7.8 mg/dL — AB (ref 8.9–10.3)
CHLORIDE: 104 mmol/L (ref 98–111)
CO2: 27 mmol/L (ref 22–32)
CREATININE: 0.59 mg/dL (ref 0.44–1.00)
GFR calc Af Amer: >60 mL/min (ref >60)
Glucose, Bld: 139 mg/dL — ABNORMAL HIGH (ref 70–99)
Potassium: 3.7 mmol/L (ref 3.5–5.1)
Sodium: 141 mmol/L (ref 135–145)
Total Protein: 5.9 g/dL — ABNORMAL LOW (ref 6.5–8.1)

## 2018-01-20 LAB — CBC
HCT: 34.3 % — ABNORMAL LOW (ref 36.0–46.0)
Hemoglobin: 11.2 g/dL — ABNORMAL LOW (ref 12.0–15.0)
MCH: 29.9 pg (ref 26.0–34.0)
MCHC: 32.7 g/dL (ref 30.0–36.0)
MCV: 91.5 fL (ref 78.0–100.0)
PLATELETS: 206 10*3/uL (ref 150–400)
RBC: 3.75 MIL/uL — AB (ref 3.87–5.11)
RDW: 13.8 % (ref 11.5–15.5)
WBC: 45.6 10*3/uL — AB (ref 4.0–10.5)

## 2018-01-20 LAB — GLUCOSE, CAPILLARY
Glucose-Capillary: 184 mg/dL — ABNORMAL HIGH (ref 70–99)
Glucose-Capillary: 136 mg/dL — ABNORMAL HIGH (ref 70–99)
Glucose-Capillary: 136 mg/dL — ABNORMAL HIGH (ref 70–99)
Glucose-Capillary: 107 mg/dL — ABNORMAL HIGH (ref 70–99)

## 2018-01-20 LAB — HIV ANTIBODY (ROUTINE TESTING W REFLEX): HIV SCREEN 4TH GENERATION: NONREACTIVE

## 2018-01-20 MED ORDER — ACETAMINOPHEN 325 MG PO TABS
650.0000 mg | ORAL_TABLET | Freq: Four times a day (QID) | ORAL | Status: DC | PRN
  Administered 2018-01-20 – 2018-01-21 (×3): 650 mg via ORAL
  Filled 2018-01-20 (×4): qty 2

## 2018-01-20 MED ORDER — SODIUM CHLORIDE (HYPERTONIC) 2 % OP SOLN
1.0000 [drp] | OPHTHALMIC | Status: DC | PRN
  Administered 2018-01-20: 1 [drp] via OPHTHALMIC
  Filled 2018-01-20: qty 15

## 2018-01-20 MED ORDER — ORAL CARE MOUTH RINSE
15.0000 mL | Freq: Two times a day (BID) | OROMUCOSAL | Status: DC
  Administered 2018-01-20 – 2018-01-22 (×5): 15 mL via OROMUCOSAL

## 2018-01-20 MED ORDER — METOPROLOL TARTRATE 25 MG PO TABS
12.5000 mg | ORAL_TABLET | Freq: Two times a day (BID) | ORAL | Status: DC
  Administered 2018-01-20 – 2018-01-22 (×5): 12.5 mg via ORAL
  Filled 2018-01-20 (×5): qty 1

## 2018-01-20 NOTE — Progress Notes (Signed)
PROGRESS NOTE    [name]  WUJ:[id number] DOB: December 04, 1950 DOA: 01/19/2018 PCP: Shirline Frees, MD   Brief Cathren Harsh y.o. 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  Patient denies any prior cardiac or lung history, has a remote history of tobacco use but quit 14 years ago.  Currently she denies any chest pain, denies any shortness of breath.  She generally active and able to do all her ADLs without difficulties.  She was diagnosed with rheumatoid arthritis in 2005.  She is also been complaining of lightheadedness since yesterday.  This all appeared all of a sudden and was in her normal state of health prior to this.  In the ED she was found to be septic with fever of 101.8, tachycardia in the 120s, blood work showed a white count of 26k, lactic acid was 4.0 and urinalysis showed evidence of a UTI.  Given flank pain she underwent a CT scan of the abdomen and pelvis which showed a 7 x 7 x 5 mm calculus in the proximal left ureter slightly beyond the UPJ causing hydronephrosis with extensive left renal edema and perinephric stranding.  There was also noted air in the bladder.  Urology was consulted, discussed with Dr. Diona Fanti and patient will be taken to the operating room as soon as possible.  We are asked to admit Assessment & Plan:   Active Problems:   Sepsis ([place])  Sepsis due to UTI/obstructive nephrolithiasis-status post stenting of the left ureter.  Continue ceftriaxone 2 g IV daily.  Follow blood cultures.  Leukocytosis worse but clinically she looks better I will follow up labs tomorrow.  Rheumatoid arthritis -Hold home medications  Hypertension restart home medications since the blood pressure is increasing and  he is becoming more tachycardic.  Diabetes mellitus -Start lower dose of Lantus, placed on sliding scale   DVT prophylaxis: SCD per urology postop Code Status full code Family Communication none :Disposition Plan: TBD  Consultants: Urology  Procedures status post stenting of the ureter Antimicrobials: Rocephin Subjective: Patient states she is feeling a lot better except for her arthritis flaring up.  No other complaints no nausea vomiting abdominal pain.  Objective: Vitals:   01/20/18 0800 01/20/18 0900 01/20/18 1000 01/20/18 1100  BP: (!) 124/59 132/63 (!) 149/71 (!) 157/64  Pulse: (!) 108 (!) 112    Resp: 12 15 13 17   Temp:      TempSrc:      SpO2: 99% 98%    Weight:      Height:        Intake/Output Summary (Last 24 hours) at 01/20/2018 1136 Last data filed at 01/20/2018 1110 Gross per 24 hour  Intake 1077.32 ml  Output 1300 ml  Net -222.68 ml   Filed Weights   01/19/18 0954 01/19/18 1148  Weight: 68 kg 68.9 kg    Examination:  General exam: Appears calm and comfortable  Respiratory system: Clear to auscultation. Respiratory effort normal. Cardiovascular system: S1 & S2 heard, RRR. No JVD, murmurs, rubs, gallops or clicks. No pedal edema. Gastrointestinal system: Abdomen is nondistended, soft and nontender. No organomegaly or masses felt. Normal bowel sounds heard. Central nervous system: Alert and oriented. No focal neurological deficits. Extremities: Symmetric 5 x 5 power. Skin: No rashes, lesions or ulcers Psychiatry: Judgement and insight  appear normal. Mood & affect appropriate.     Data Reviewed: I have personally reviewed following labs and imaging studies  CBC: Recent Labs  Lab 01/19/18 0748 01/20/18 0344  WBC 26.8* 45.6*  HGB 13.5 11.2*  HCT 40.0 34.3*  MCV 90.1 91.5  PLT 341 846   Basic Metabolic Panel: Recent Labs  Lab 01/19/18 0748 01/20/18 0344  NA 141 141  K 4.4 3.7  CL 102 104  CO2 24 27  GLUCOSE 256* 139*  BUN 13 13    CREATININE 0.69 0.59  CALCIUM 9.6 7.8*   GFR: Estimated Creatinine Clearance: 62.7 mL/min (by C-G formula based on SCr of 0.59 mg/dL). Liver Function Tests: Recent Labs  Lab 01/20/18 0344  AST 87*  ALT 133*  ALKPHOS 41  BILITOT 1.0  PROT 5.9*  ALBUMIN 2.8*   No results for input(s): LIPASE, AMYLASE in the last 168 hours. No results for input(s): AMMONIA in the last 168 hours. Coagulation Profile: No results for input(s): INR, PROTIME in the last 168 hours. Cardiac Enzymes: No results for input(s): CKTOTAL, CKMB, CKMBINDEX, TROPONINI in the last 168 hours. BNP (last 3 results) No results for input(s): PROBNP in the last 8760 hours. HbA1C: No results for input(s): HGBA1C in the last 72 hours. CBG: Recent Labs  Lab 01/19/18 1149 01/19/18 1303 01/19/18 1619 01/19/18 2224 01/20/18 0740  GLUCAP 152* 146* 148* 169* 184*   Lipid Profile: No results for input(s): CHOL, HDL, LDLCALC, TRIG, CHOLHDL, LDLDIRECT in the last 72 hours. Thyroid Function Tests: No results for input(s): TSH, T4TOTAL, FREET4, T3FREE, THYROIDAB in the last 72 hours. Anemia Panel: No results for input(s): VITAMINB12, FOLATE, FERRITIN, TIBC, IRON, RETICCTPCT in the last 72 hours. Sepsis Labs: Recent Labs  Lab 01/19/18 6599 01/19/18 1047 01/19/18 1357  LATICACIDVEN 4.08* 3.66* 4.2*    Recent Results (from the past 240 hour(s))  Urine culture     Status: Abnormal (Preliminary result)   Collection Time: 01/19/18  7:48 AM  Result Value Ref Range Status   Specimen Description   Final    URINE, CLEAN CATCH Performed at [facility], [place] [address], [place], [place] [address]    Special Requests   Final    NONE Performed at [facility], [place] [address], [place], [place] [address]    Culture (A)  Final    >=100,000 COLONIES/mL GRAM NEGATIVE RODS IDENTIFICATION AND SUSCEPTIBILITIES TO FOLLOW Performed at [facility], [place] [address], [place], [place]  [address]    Report Status PENDING  Incomplete  Blood culture (routine x 2)     Status: None (Preliminary result)   Collection Time: 01/19/18  8:38 AM  Result Value Ref Range Status   Specimen Description   Final    BLOOD RIGHT ARM Performed at [place] [address] [place], [place] [address]    Special Requests   Final    BOTTLES DRAWN AEROBIC AND ANAEROBIC Blood Culture adequate volume Performed at [place] [address], [place], [place] [address]    Culture  Setup Time   Final    GRAM NEGATIVE RODS IN BOTH AEROBIC AND ANAEROBIC BOTTLES CRITICAL VALUE NOTED.  VALUE IS CONSISTENT WITH PREVIOUSLY REPORTED AND CALLED VALUE. Performed at [facility], [place] [address] [place], [place] [address]    Culture GRAM NEGATIVE RODS  Final   Report Status PENDING  Incomplete  Blood culture (routine x 2)     Status:  Abnormal (Preliminary result)   Collection Time: 01/19/18  9:15 AM  Result Value Ref Range Status   Specimen Description   Final    BLOOD RIGHT HAND Performed at [place] [address] [place], [place] [address]    Special Requests   Final    BOTTLES DRAWN AEROBIC ONLY Blood Culture adequate volume Performed at [place] [address] [place], [place] [address]    Culture  Setup Time   Final    GRAM NEGATIVE RODS AEROBIC BOTTLE ONLY CRITICAL RESULT CALLED TO, READ BACK BY AND VERIFIED WITH: Damian Leavell [id number] MLM Performed at [facility], [place] [address], [place], [place] [address]    Culture KLEBSIELLA PNEUMONIAE (A)  Final   Report Status PENDING  Incomplete  Blood Culture ID Panel (Reflexed)     Status: Abnormal   Collection Time: 01/19/18  9:15 AM  Result Value Ref Range Status   Enterococcus species NOT DETECTED NOT DETECTED Final   Listeria monocytogenes NOT DETECTED NOT DETECTED Final   Staphylococcus species NOT DETECTED NOT DETECTED Final    Staphylococcus aureus NOT DETECTED NOT DETECTED Final   Streptococcus species NOT DETECTED NOT DETECTED Final   Streptococcus agalactiae NOT DETECTED NOT DETECTED Final   Streptococcus pneumoniae NOT DETECTED NOT DETECTED Final   Streptococcus pyogenes NOT DETECTED NOT DETECTED Final   Acinetobacter baumannii NOT DETECTED NOT DETECTED Final   Enterobacteriaceae species DETECTED (A) NOT DETECTED Final    Comment: Enterobacteriaceae represent a large family of gram-negative bacteria, not a single organism. CRITICAL RESULT CALLED TO, READ BACK BY AND VERIFIED WITH: Damian Leavell [id number] MLM    Enterobacter cloacae complex NOT DETECTED NOT DETECTED Final   Escherichia coli NOT DETECTED NOT DETECTED Final   Klebsiella oxytoca NOT DETECTED NOT DETECTED Final   Klebsiella pneumoniae DETECTED (A) NOT DETECTED Final    Comment: CRITICAL RESULT CALLED TO, READ BACK BY AND VERIFIED WITH: Damian Leavell [id number] MLM    Proteus species NOT DETECTED NOT DETECTED Final   Serratia marcescens NOT DETECTED NOT DETECTED Final   Carbapenem resistance NOT DETECTED NOT DETECTED Final   Haemophilus influenzae NOT DETECTED NOT DETECTED Final   Neisseria meningitidis NOT DETECTED NOT DETECTED Final   Pseudomonas aeruginosa NOT DETECTED NOT DETECTED Final   Candida albicans NOT DETECTED NOT DETECTED Final   Candida glabrata NOT DETECTED NOT DETECTED Final   Candida krusei NOT DETECTED NOT DETECTED Final   Candida parapsilosis NOT DETECTED NOT DETECTED Final   Candida tropicalis NOT DETECTED NOT DETECTED Final    Comment: Performed at [facility], [place]. [address] [place], [place] [address]  MRSA PCR Screening     Status: None   Collection Time: 01/19/18  7:41 PM  Result Value Ref Range Status   MRSA by PCR NEGATIVE NEGATIVE Final    Comment:        The GeneXpert MRSA Assay (FDA approved for NASAL specimens only), is one component of a comprehensive MRSA colonization surveillance program.  It is not intended to diagnose MRSA infection nor to guide or monitor treatment for MRSA infections. Performed at [facility], [place] [address] [place], [place] [address]          Radiology Studies: Dg C-arm 1-60 Min-no Report  Result Date: 01/19/2018 Fluoroscopy was utilized by the requesting physician.  No radiographic interpretation.   Ct Renal Stone Study  Addendum Date: 01/19/2018  ADDENDUM REPORT: 01/19/2018 10:37 ADDENDUM: There is air in the urinary bladder. Question instrumentation as the cause. If patient has not had instrumentation in the bladder, gas-forming organism as the cause must be of concern. Correlation with urinalysis in this regard advised. No intravesical fistula appreciable by CT. Electronically Signed   By: Lowella Grip III M.D.   On: 01/19/2018 10:37   Result Date: 01/19/2018 CLINICAL DATA:  Left flank region pain EXAM: CT ABDOMEN AND PELVIS WITHOUT CONTRAST TECHNIQUE: Multidetector CT imaging of the abdomen and pelvis was performed following the standard protocol without oral or IV contrast. COMPARISON:  March 29, 2005 FINDINGS: Lower chest: There is no edema or consolidation. There are foci coronary artery calcification as well as mitral annulus region calcification. Hepatobiliary: No focal liver lesions are appreciable. Gallbladder is absent. There is no appreciable biliary duct dilatation. Pancreas: Pancreas appears somewhat atrophic. No pancreatic mass or inflammatory focus evident. Spleen: No splenic lesions are evident. Adrenals/Urinary Tract: Adrenals bilaterally appear unremarkable. 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 There is a probable hyperdense cyst in the mid left kidney measuring 1.9 x 1.4 cm. There is a cyst arising eccentrically from the upper pole of the left kidney posteriorly measuring 2.0 x 1.8 cm. There is extensive left renal edema with perinephric  fluid and stranding throughout the perinephric fascia on the left. There is moderate hydronephrosis on the left. There is no appreciable hydronephrosis on the right. On the right, there is a nonobstructing calculus in the mid kidney measuring 6 x 5 mm. On the left, there is a calculus in the proximal left ureter slightly beyond the ureteropelvic junction measuring 7 x 7 x 5 mm. No other ureteral calculi are evident. There is air within the urinary bladder. Urinary bladder wall is not appreciably thickened. Stomach/Bowel: There are descending colonic and sigmoid diverticula without diverticulitis. There is postoperative change in stomach region. There is no appreciable bowel wall or mesenteric thickening. No evident bowel obstruction. There is no free air or portal venous air. Vascular/Lymphatic: There is aortoiliac atherosclerosis. No aneurysm evident. Major mesenteric arterial vessels appear patent. There is no adenopathy in the abdomen or pelvis. Reproductive: Uterus is absent.  There is no evident pelvic mass. Other: No periappendiceal region inflammation evident. No evident abscess or ascites in the abdomen or pelvis. There is a ventral hernia containing fat but no bowel. Musculoskeletal: Postoperative changes noted in the lower lumbar spine and proximal left femur. No blastic or lytic bone lesions. No intramuscular lesions are evident. IMPRESSION: 1. 7 x 7 x 5 mm calculus in the proximal left ureter slightly beyond the ureteropelvic junction on the left causing moderate hydronephrosis. There is extensive left renal edema and perinephric stranding/fluid. 2.  Nonobstructing calculus right kidney measuring 6 x 5 mm. 3. 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 Probable hyperdense cyst mid left kidney measuring 1.9 x 1.4 cm. 4. Left colonic diverticulosis without diverticulitis. No bowel obstruction. No abscess in the abdomen or pelvis. No periappendiceal region inflammatory change. 5.  Postoperative change in the region of the stomach. Uterus absent. Gallbladder absent. Postoperative changes noted in proximal left femur and lower lumbar region. 6. Aortoiliac atherosclerosis. There are foci of coronary artery calcification. 7.  Midline ventral hernia containing fat but no bowel. Aortic Atherosclerosis (ICD10-I70.0). Electronically Signed: By: Lowella Grip III M.D. On: 01/19/2018 09:07        Scheduled Meds: .  DULoxetine  60 mg Oral Daily  . hydroxychloroquine  400 mg Oral Daily  . insulin aspart  0-9 Units Subcutaneous TID WC  . insulin glargine  12 Units Subcutaneous QHS  . levothyroxine  200 mcg Oral QAC breakfast  . mouth rinse  15 mL Mouth Rinse BID  . oxyCODONE  30 mg Oral Q12H  . pantoprazole  40 mg Oral Daily  . pravastatin  40 mg Oral Daily   Continuous Infusions: . sodium chloride Stopped (01/20/18 1039)  . cefTRIAXone (ROCEPHIN)  IV Stopped (01/20/18 1110)     LOS: 1 day     Georgette Shell, MD [facility]  If 7PM-7AM, please contact night-coverage [email or web address] Password [facility] 01/20/2018, 11:36 AM

## 2018-01-20 NOTE — Progress Notes (Signed)
1 Day Post-Op Subjective: Patient denies significant issues. She looks good.  Objective: Vital signs in last 24 hours: Temp:  [97.8 F (36.6 C)-101.8 F (38.8 C)] 97.8 F (36.6 C) (09/21 0737) Pulse Rate:  [107-133] 108 (09/21 0600) Resp:  [9-32] 14 (09/21 0600) BP: (97-166)/(49-82) 130/56 (09/21 0600) SpO2:  [93 %-100 %] 100 % (09/21 0600) Weight:  [68 kg-68.9 kg] 68.9 kg (09/20 1148)  Intake/Output from previous day: 09/20 0701 - 09/21 0700 In: 980 [P.O.:480; I.V.:500] Out: 1300 [Urine:1300] Intake/Output this shift: No intake/output data recorded.  Physical Exam:  Constitutional: Vital signs reviewed. WD WN in NAD   Eyes: PERRL, No scleral icterus.   Cardiovascular: Sinus tachy Pulmonary/Chest: Normal effort   Lab Results: Recent Labs    01/19/18 0748 01/20/18 0344  HGB 13.5 11.2*  HCT 40.0 34.3*   BMET Recent Labs    01/19/18 0748 01/20/18 0344  NA 141 141  K 4.4 3.7  CL 102 104  CO2 24 27  GLUCOSE 256* 139*  BUN 13 13  CREATININE 0.69 0.59  CALCIUM 9.6 7.8*   No results for input(s): LABPT, INR in the last 72 hours. No results for input(s): LABURIN in the last 72 hours. Results for orders placed or performed during the hospital encounter of 01/19/18  Blood culture (routine x 2)     Status: None (Preliminary result)   Collection Time: 01/19/18  8:38 AM  Result Value Ref Range Status   Specimen Description   Final    BLOOD RIGHT ARM Performed at [facility] [address] [place], [place] [address]    Special Requests   Final    BOTTLES DRAWN AEROBIC AND ANAEROBIC Blood Culture adequate volume Performed at [facility] [address] [place], [place]  [address]    Culture  Setup Time   Final    GRAM NEGATIVE RODS IN BOTH AEROBIC AND ANAEROBIC BOTTLES CRITICAL VALUE NOTED.  VALUE IS CONSISTENT WITH PREVIOUSLY REPORTED AND CALLED VALUE. Performed at [facility], [place] [address], [place], [place]  [address]    Culture GRAM NEGATIVE RODS  Final   Report Status PENDING  Incomplete  Blood culture (routine x 2)     Status: None (Preliminary result)   Collection Time: 01/19/18  9:15 AM  Result Value Ref Range Status   Specimen Description   Final    BLOOD RIGHT HAND Performed at [facility] [address], [place], [place] [address]    Special Requests   Final    BOTTLES DRAWN AEROBIC ONLY Blood Culture adequate volume Performed at [facility] [address], [place], [place] [address]    Culture  Setup Time   Final    GRAM NEGATIVE RODS AEROBIC BOTTLE ONLY Organism ID to follow CRITICAL RESULT CALLED TO, READ BACK BY AND VERIFIED WITH: Damian Leavell [id number] MLM Performed at [facility], [place] [facility] [place] [address] [place], [place] [address]    Culture GRAM NEGATIVE RODS  Final   Report Status PENDING  Incomplete  Blood Culture ID Panel (Reflexed)     Status: Abnormal   Collection Time: 01/19/18  9:15 AM  Result Value Ref Range Status   Enterococcus species NOT DETECTED NOT DETECTED Final   Listeria monocytogenes NOT DETECTED NOT DETECTED Final   Staphylococcus species NOT DETECTED NOT DETECTED Final   Staphylococcus aureus NOT DETECTED NOT DETECTED Final   Streptococcus species NOT DETECTED NOT DETECTED Final  Streptococcus agalactiae NOT DETECTED NOT DETECTED Final   Streptococcus pneumoniae NOT DETECTED NOT DETECTED Final   Streptococcus pyogenes NOT DETECTED NOT DETECTED Final   Acinetobacter baumannii NOT DETECTED NOT DETECTED Final   Enterobacteriaceae species DETECTED (A) NOT DETECTED Final    Comment: Enterobacteriaceae represent a large family of gram-negative bacteria, not a single organism. CRITICAL RESULT CALLED TO, READ BACK BY AND VERIFIED WITH: Damian Leavell [id number] MLM    Enterobacter cloacae complex NOT DETECTED NOT DETECTED Final   Escherichia coli NOT DETECTED NOT DETECTED Final   Klebsiella oxytoca NOT  DETECTED NOT DETECTED Final   Klebsiella pneumoniae DETECTED (A) NOT DETECTED Final    Comment: CRITICAL RESULT CALLED TO, READ BACK BY AND VERIFIED WITH: Damian Leavell [id number] [id number] MLM    Proteus species NOT DETECTED NOT DETECTED Final   Serratia marcescens NOT DETECTED NOT DETECTED Final   Carbapenem resistance NOT DETECTED NOT DETECTED Final   Haemophilus influenzae NOT DETECTED NOT DETECTED Final   Neisseria meningitidis NOT DETECTED NOT DETECTED Final   Pseudomonas aeruginosa NOT DETECTED NOT DETECTED Final   Candida albicans NOT DETECTED NOT DETECTED Final   Candida glabrata NOT DETECTED NOT DETECTED Final   Candida krusei NOT DETECTED NOT DETECTED Final   Candida parapsilosis NOT DETECTED NOT DETECTED Final   Candida tropicalis NOT DETECTED NOT DETECTED Final    Comment: Performed at [facility], [place]. [address], [place], [place] [address]  MRSA PCR Screening     Status: None   Collection Time: 01/19/18  7:41 PM  Result Value Ref Range Status   MRSA by PCR NEGATIVE NEGATIVE Final    Comment:        The GeneXpert MRSA Assay (FDA approved for NASAL specimens only), is one component of a comprehensive MRSA colonization surveillance program. It is not intended to diagnose MRSA infection nor to guide or monitor treatment for MRSA infections. Performed at [facility], [place] [address], [place],  [address]     Studies/Results: Dg C-arm 1-60 Min-no Report  Result Date: 01/19/2018 Fluoroscopy was utilized by the requesting physician.  No radiographic interpretation.   Ct Renal Stone Study  Addendum Date: 01/19/2018   ADDENDUM REPORT: 01/19/2018 10:37 ADDENDUM: There is air in the urinary bladder. Question instrumentation as the cause. If patient has not had instrumentation in the bladder, gas-forming organism as the cause must be of concern. Correlation with urinalysis in this regard advised. No intravesical fistula appreciable by CT.  Electronically Signed   By: Lowella Grip III M.D.   On: 01/19/2018 10:37   Result Date: 01/19/2018 CLINICAL DATA:  Left flank region pain EXAM: CT ABDOMEN AND PELVIS WITHOUT CONTRAST TECHNIQUE: Multidetector CT imaging of the abdomen and pelvis was performed following the standard protocol without oral or IV contrast. COMPARISON:  March 29, 2005 FINDINGS: Lower chest: There is no edema or consolidation. There are foci coronary artery calcification as well as mitral annulus region calcification. Hepatobiliary: No focal liver lesions are appreciable. Gallbladder is absent. There is no appreciable biliary duct dilatation. Pancreas: Pancreas appears somewhat atrophic. No pancreatic mass or inflammatory focus evident. Spleen: No splenic lesions are evident. Adrenals/Urinary Tract: Adrenals bilaterally appear unremarkable. 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 There is a probable hyperdense cyst in the mid left kidney measuring 1.9 x 1.4 cm. There is a cyst arising eccentrically from the upper pole of the left kidney  posteriorly measuring 2.0 x 1.8 cm. There is extensive left renal edema with perinephric fluid and stranding throughout the perinephric fascia on the left. There is moderate hydronephrosis on the left. There is no appreciable hydronephrosis on the right. On the right, there is a nonobstructing calculus in the mid kidney measuring 6 x 5 mm. On the left, there is a calculus in the proximal left ureter slightly beyond the ureteropelvic junction measuring 7 x 7 x 5 mm. No other ureteral calculi are evident. There is air within the urinary bladder. Urinary bladder wall is not appreciably thickened. Stomach/Bowel: There are descending colonic and sigmoid diverticula without diverticulitis. There is postoperative change in stomach region. There is no appreciable bowel wall or mesenteric thickening. No evident bowel obstruction. There is  no free air or portal venous air. Vascular/Lymphatic: There is aortoiliac atherosclerosis. No aneurysm evident. Major mesenteric arterial vessels appear patent. There is no adenopathy in the abdomen or pelvis. Reproductive: Uterus is absent.  There is no evident pelvic mass. Other: No periappendiceal region inflammation evident. No evident abscess or ascites in the abdomen or pelvis. There is a ventral hernia containing fat but no bowel. Musculoskeletal: Postoperative changes noted in the lower lumbar spine and proximal left femur. No blastic or lytic bone lesions. No intramuscular lesions are evident. IMPRESSION: 1. 7 x 7 x 5 mm calculus in the proximal left ureter slightly beyond the ureteropelvic junction on the left causing moderate hydronephrosis. There is extensive left renal edema and perinephric stranding/fluid. 2.  Nonobstructing calculus right kidney measuring 6 x 5 mm. 3. 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 Probable hyperdense cyst mid left kidney measuring 1.9 x 1.4 cm. 4. Left colonic diverticulosis without diverticulitis. No bowel obstruction. No abscess in the abdomen or pelvis. No periappendiceal region inflammatory change. 5. Postoperative change in the region of the stomach. Uterus absent. Gallbladder absent. Postoperative changes noted in proximal left femur and lower lumbar region. 6. Aortoiliac atherosclerosis. There are foci of coronary artery calcification. 7.  Midline ventral hernia containing fat but no bowel. Aortic Atherosclerosis (ICD10-I70.0). Electronically Signed: By: Lowella Grip III M.D. On: 01/19/2018 09:07    Assessment/Plan:   LT ureteral stone w/ pyonephrosis, sepsis, + BCs. Stented yesterday. She looks good. WBC >40k.  No more GU intervention needed @ present--will follow, arrange f/u for eventual stone mgmt     LOS: 1 day   [name] 01/20/2018, 9:08 AM

## 2018-01-20 NOTE — Plan of Care (Signed)
  Problem: Activity: Goal: Risk for activity intolerance will decrease Outcome: Progressing   Problem: Nutrition: Goal: Adequate nutrition will be maintained Outcome: Progressing   Problem: Pain Managment: Goal: General experience of comfort will improve Outcome: Progressing   

## 2018-01-21 LAB — CBC
HEMATOCRIT: 30.6 % — AB (ref 36.0–46.0)
Hemoglobin: 9.9 g/dL — ABNORMAL LOW (ref 12.0–15.0)
MCH: 29.6 pg (ref 26.0–34.0)
MCHC: 32.4 g/dL (ref 30.0–36.0)
MCV: 91.3 fL (ref 78.0–100.0)
Platelets: 135 10*3/uL — ABNORMAL LOW (ref 150–400)
RBC: 3.35 MIL/uL — ABNORMAL LOW (ref 3.87–5.11)
RDW: 14.1 % (ref 11.5–15.5)
WBC: 29.2 10*3/uL — ABNORMAL HIGH (ref 4.0–10.5)

## 2018-01-21 LAB — BASIC METABOLIC PANEL
Anion gap: 8 (ref 5–15)
BUN: 11 mg/dL (ref 8–23)
CALCIUM: 7.7 mg/dL — AB (ref 8.9–10.3)
CO2: 28 mmol/L (ref 22–32)
CREATININE: 0.5 mg/dL (ref 0.44–1.00)
Chloride: 105 mmol/L (ref 98–111)
GFR calc Af Amer: >60 mL/min (ref >60)
GFR calc non Af Amer: >60 mL/min (ref >60)
GLUCOSE: 131 mg/dL — AB (ref 70–99)
Potassium: 3.6 mmol/L (ref 3.5–5.1)
Sodium: 141 mmol/L (ref 135–145)

## 2018-01-21 LAB — CULTURE, BLOOD (ROUTINE X 2)
SPECIAL REQUESTS: ADEQUATE
Special Requests: ADEQUATE

## 2018-01-21 LAB — GLUCOSE, CAPILLARY
GLUCOSE-CAPILLARY: 161 mg/dL — AB (ref 70–99)
Glucose-Capillary: 109 mg/dL — ABNORMAL HIGH (ref 70–99)
Glucose-Capillary: 145 mg/dL — ABNORMAL HIGH (ref 70–99)
Glucose-Capillary: 121 mg/dL — ABNORMAL HIGH (ref 70–99)

## 2018-01-21 LAB — URINE CULTURE: Culture: >=100000 — AB

## 2018-01-21 NOTE — Progress Notes (Signed)
Patient transferred to room 1441- oriented to room and unit 4W. VSS, no change to previous assessment. Pt resting comfortably in bed at this time, watching tv. Denies pain & discomfort.

## 2018-01-21 NOTE — Progress Notes (Signed)
2 Days Post-Op Subjective: Patient reports that she is feeling fine.  No specific pain.  Objective: Vital signs in last 24 hours: Temp:  [97.8 F (36.6 C)-99.9 F (37.7 C)] 97.9 F (36.6 C) (09/22 0703) Pulse Rate:  [98-118] 101 (09/22 0800) Resp:  [12-27] 14 (09/22 0800) BP: (113-157)/(44-71) 114/51 (09/22 0800) SpO2:  [89 %-98 %] 94 % (09/22 0800)  Intake/Output from previous day: 09/21 0701 - 09/22 0700 In: 2192.6 [P.O.:240; I.V.:1856.5; IV Piggyback:96.1] Out: 1150 [Urine:1150] Intake/Output this shift: Total I/O In: 42.1 [I.V.:42.1] Out: 1500 [Urine:1500]  Physical Exam:  Constitutional: Vital signs reviewed. WD WN in NAD   Eyes: PERRL, No scleral icterus.   Cardiovascular: RRR Pulmonary/Chest: Normal effort    Lab Results: Recent Labs    01/19/18 0748 01/20/18 0344 01/21/18 0403  HGB 13.5 11.2* 9.9*  HCT 40.0 34.3* 30.6*   BMET Recent Labs    01/20/18 0344 01/21/18 0403  NA 141 141  K 3.7 3.6  CL 104 105  CO2 27 28  GLUCOSE 139* 131*  BUN 13 11  CREATININE 0.59 0.50  CALCIUM 7.8* 7.7*   No results for input(s): LABPT, INR in the last 72 hours. No results for input(s): LABURIN in the last 72 hours. Results for orders placed or performed during the hospital encounter of 01/19/18  Urine culture     Status: Abnormal   Collection Time: 01/19/18  7:48 AM  Result Value Ref Range Status   Specimen Description   Final    URINE, CLEAN CATCH Performed at [facility], [place] [address] [place], [place] [address]    Special Requests   Final    NONE Performed at [facility], [place] [address] [place], [place] [address]    Culture >=100,000 COLONIES/mL KLEBSIELLA PNEUMONIAE (A)  Final   Report Status 01/21/2018 FINAL  Final   Organism ID, Bacteria KLEBSIELLA PNEUMONIAE (A)  Final      Susceptibility   Klebsiella pneumoniae - MIC*    AMPICILLIN RESISTANT Resistant     CEFAZOLIN <=4 SENSITIVE Sensitive    CEFTRIAXONE <=1 SENSITIVE Sensitive     CIPROFLOXACIN <=0.25 SENSITIVE Sensitive     GENTAMICIN <=1 SENSITIVE Sensitive     IMIPENEM <=0.25 SENSITIVE Sensitive     NITROFURANTOIN 64 INTERMEDIATE Intermediate     TRIMETH/SULFA <=20 SENSITIVE Sensitive     AMPICILLIN/SULBACTAM <=2 SENSITIVE Sensitive     PIP/TAZO <=4 SENSITIVE Sensitive     Extended ESBL NEGATIVE Sensitive     * >=100,000 COLONIES/mL KLEBSIELLA PNEUMONIAE  Blood culture (routine x 2)     Status: Abnormal (Preliminary result)   Collection Time: 01/19/18  8:38 AM  Result Value Ref Range Status   Specimen Description   Final    BLOOD RIGHT ARM Performed at [facility] [address] [place], [place] [address]    Special Requests   Final    BOTTLES DRAWN AEROBIC AND ANAEROBIC Blood Culture adequate volume Performed at [facility] [address] [place], [place] [address]    Culture  Setup Time   Final    GRAM NEGATIVE RODS IN BOTH AEROBIC AND ANAEROBIC BOTTLES CRITICAL VALUE NOTED.  VALUE IS CONSISTENT WITH PREVIOUSLY REPORTED AND CALLED VALUE. Performed at [facility], [place] [address] [place], [place] [address]    Culture KLEBSIELLA PNEUMONIAE (A)  Final   Report Status PENDING  Incomplete  Blood culture (routine x 2)     Status: Abnormal (Preliminary result)  Collection Time: 01/19/18  9:15 AM  Result Value Ref Range Status   Specimen Description   Final    BLOOD RIGHT HAND Performed at [facility] [address], [place], [place] [address]    Special Requests   Final    BOTTLES DRAWN AEROBIC ONLY Blood Culture adequate volume Performed at [facility] [address] [place], [place] [address]    Culture  Setup Time   Final    GRAM NEGATIVE RODS AEROBIC BOTTLE ONLY CRITICAL RESULT CALLED TO, READ BACK BY AND VERIFIED WITH: Damian Leavell [id number] MLM Performed at [facility], [place] [address],  [place], [place] [address]    Culture KLEBSIELLA PNEUMONIAE (A)  Final   Report Status PENDING  Incomplete  Blood Culture ID Panel (Reflexed)     Status: Abnormal   Collection Time: 01/19/18  9:15 AM  Result Value Ref Range Status   Enterococcus species NOT DETECTED NOT DETECTED Final   Listeria monocytogenes NOT DETECTED NOT DETECTED Final   Staphylococcus species NOT DETECTED NOT DETECTED Final   Staphylococcus aureus NOT DETECTED NOT DETECTED Final   Streptococcus species NOT DETECTED NOT DETECTED Final   Streptococcus agalactiae NOT DETECTED NOT DETECTED Final   Streptococcus pneumoniae NOT DETECTED NOT DETECTED Final   Streptococcus pyogenes NOT DETECTED NOT DETECTED Final   Acinetobacter baumannii NOT DETECTED NOT DETECTED Final   Enterobacteriaceae species DETECTED (A) NOT DETECTED Final    Comment: Enterobacteriaceae represent a large family of gram-negative bacteria, not a single organism. CRITICAL RESULT CALLED TO, READ BACK BY AND VERIFIED WITH: Damian Leavell [phone] MLM    Enterobacter cloacae complex NOT DETECTED NOT DETECTED Final   Escherichia coli NOT DETECTED NOT DETECTED Final   Klebsiella oxytoca NOT DETECTED NOT DETECTED Final   Klebsiella pneumoniae DETECTED (A) NOT DETECTED Final    Comment: CRITICAL RESULT CALLED TO, READ BACK BY AND VERIFIED WITH: Damian Leavell [id number] [id number] MLM    Proteus species NOT DETECTED NOT DETECTED Final   Serratia marcescens NOT DETECTED NOT DETECTED Final   Carbapenem resistance NOT DETECTED NOT DETECTED Final   Haemophilus influenzae NOT DETECTED NOT DETECTED Final   Neisseria meningitidis NOT DETECTED NOT DETECTED Final   Pseudomonas aeruginosa NOT DETECTED NOT DETECTED Final   Candida albicans NOT DETECTED NOT DETECTED Final   Candida glabrata NOT DETECTED NOT DETECTED Final   Candida krusei NOT DETECTED NOT DETECTED Final   Candida parapsilosis NOT DETECTED NOT DETECTED Final   Candida tropicalis NOT DETECTED NOT DETECTED  Final    Comment: Performed at [facility], [place]. [address] [place], [place] [address]  MRSA PCR Screening     Status: None   Collection Time: 01/19/18  7:41 PM  Result Value Ref Range Status   MRSA by PCR NEGATIVE NEGATIVE Final    Comment:        The GeneXpert MRSA Assay (FDA approved for NASAL specimens only), is one component of a comprehensive MRSA colonization surveillance program. It is not intended to diagnose MRSA infection nor to guide or monitor treatment for MRSA infections. Performed at [facility], [place] [address] [place], [place] [address]     Studies/Results: Dg C-arm 1-60 Min-no Report  Result Date: 01/19/2018 Fluoroscopy was utilized by the requesting physician.  No radiographic interpretation.    Assessment/Plan:   Postoperative day 2 urgent stenting for infected/obstructing left ureteral stone.  Blood cultures positive for Klebsiella.  She is actually looking quite well.  From my standpoint, we will arrange outpatient follow-up to be followed after that by either ureteroscopy or shockwave lithotripsy for her left-sided stone.  We will arrange proper follow-up.  Routine antibiotic management for her bacteremia.   LOS: 2 days   Jorja Loa 01/21/2018, 8:59 AM

## 2018-01-21 NOTE — Progress Notes (Signed)
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 Patient denies any prior cardiac or lung history, has a remote history of tobacco use but quit 14years ago. Currently she denies any chest pain, denies any shortness of breath. She generally active and able to do all her ADLs without difficulties. She was diagnosed with rheumatoid arthritis in 2005.She is also been complaining of lightheadedness since yesterday. This all appeared all of a sudden and was in her normal state of health prior to this. In the ED she was found to be septic with fever of 101.8, tachycardia in the 120s, blood work showed a white count of 26k, lactic acid was 4.0 and urinalysis showed evidence of a UTI. Given flank pain she underwent a CT scan of the abdomen and pelvis which showed a 7 x 7 x 5 mm calculus in the proximal left ureter slightly beyond the UPJ causing hydronephrosis with extensive left renal edema and perinephric stranding. There was also noted air in the bladder. Urology was consulted, discussed with Dr. Diona Fanti and patient will be taken to the operating room as soon as possible. We are asked to admit  Assessment & Plan:   Active Problems:   Sepsis ([place])   Acute pyelonephritis   Kidney stone  Sepsis due to UTI/obstructive nephrolithiasis-status post stenting of the left ureter.  Culture and urine culture positive for Klebsiella pneumonia.  She has received 2 doses of Rocephin so far.  She is still tachycardic though no fever.   her white count is elevated at 29,000 though it is trending down.  I would like to  see her tachycardia getting better and her white count improving further.  If clinically stable and these parameters improve plan discharge tomorrow.  Patient lives at home.  She is aware of the plan.  Rheumatoid arthritis -Hold home medications  Hypertension restart home medications since the blood pressure is increasing and he is becoming more tachycardic.  Diabetes mellitus -Start lower dose of Lantus,placed on sliding scale   DVT prophylaxis: SCD per urology postop Code Status full code Family Communication none :Disposition Plan: TBD  Consultants: Urology  Procedures status post stenting of the ureter Antimicrobials: Rocephin  Subjective:  She is feeling better after iv hydration and iv antibiotics  Objective: Vitals:   01/21/18 0347 01/21/18 0600 01/21/18 0703 01/21/18 0800  BP:  133/71  (!) 114/51  Pulse:  98  (!) 101  Resp:  (!) 21  14  Temp: 97.8 F (36.6 C)  97.9 F (36.6 C)   TempSrc: Oral  Oral   SpO2:  94%  94%  Weight:      Height:        Intake/Output Summary (Last 24 hours) at 01/21/2018 1054 Last data filed at 01/21/2018 0940 Gross per 24 hour  Intake 2431.06 ml  Output 2800 ml  Net -368.94 ml   Filed Weights   01/19/18 0954 01/19/18 1148  Weight: 68 kg 68.9 kg    Examination:  General exam: Appears calm and comfortable  Respiratory system: Clear to auscultation. Respiratory effort normal. Cardiovascular system: S1 & S2 heard, RRR. No JVD, murmurs, rubs, gallops or clicks. No pedal edema. Gastrointestinal system: Abdomen is nondistended,  soft and nontender. No organomegaly or masses felt. Normal bowel sounds heard. Central nervous system: Alert and oriented. No focal neurological deficits. Extremities: Symmetric 5 x 5 power. Skin: No rashes, lesions or ulcers Psychiatry: Judgement and insight appear normal. Mood & affect appropriate.     Data Reviewed: I have personally reviewed following labs and imaging studies  CBC: Recent  Labs  Lab 01/19/18 0748 01/20/18 0344 01/21/18 0403  WBC 26.8* 45.6* 29.2*  HGB 13.5 11.2* 9.9*  HCT 40.0 34.3* 30.6*  MCV 90.1 91.5 91.3  PLT 341 206 465*   Basic Metabolic Panel: Recent Labs  Lab 01/19/18 0748 01/20/18 0344 01/21/18 0403  NA 141 141 141  K 4.4 3.7 3.6  CL 102 104 105  CO2 24 27 28   GLUCOSE 256* 139* 131*  BUN 13 13 11   CREATININE 0.69 0.59 0.50  CALCIUM 9.6 7.8* 7.7*   GFR: Estimated Creatinine Clearance: 62.7 mL/min (by C-G formula based on SCr of 0.5 mg/dL). Liver Function Tests: Recent Labs  Lab 01/20/18 0344  AST 87*  ALT 133*  ALKPHOS 41  BILITOT 1.0  PROT 5.9*  ALBUMIN 2.8*   No results for input(s): LIPASE, AMYLASE in the last 168 hours. No results for input(s): AMMONIA in the last 168 hours. Coagulation Profile: No results for input(s): INR, PROTIME in the last 168 hours. Cardiac Enzymes: No results for input(s): CKTOTAL, CKMB, CKMBINDEX, TROPONINI in the last 168 hours. BNP (last 3 results) No results for input(s): PROBNP in the last 8760 hours. HbA1C: No results for input(s): HGBA1C in the last 72 hours. CBG: Recent Labs  Lab 01/20/18 0740 01/20/18 1148 01/20/18 1645 01/20/18 2115 01/21/18 0719  GLUCAP 184* 136* 136* 107* 109*   Lipid Profile: No results for input(s): CHOL, HDL, LDLCALC, TRIG, CHOLHDL, LDLDIRECT in the last 72 hours. Thyroid Function Tests: No results for input(s): TSH, T4TOTAL, FREET4, T3FREE, THYROIDAB in the last 72 hours. Anemia Panel: No results for input(s): VITAMINB12, FOLATE, FERRITIN, TIBC, IRON, RETICCTPCT in the last 72 hours. Sepsis Labs: Recent Labs  Lab 01/19/18 0354 01/19/18 1047 01/19/18 1357  LATICACIDVEN 4.08* 3.66* 4.2*    Recent Results (from the past 240 hour(s))  Urine culture     Status: Abnormal   Collection Time: 01/19/18  7:48 AM  Result Value Ref Range Status   Specimen Description   Final    URINE, CLEAN CATCH Performed at [facility], [place]  [address], [place], [place] [address]    Special Requests   Final    NONE Performed at [facility], [place] [address], [place], [place] [address]    Culture >=100,000 COLONIES/mL KLEBSIELLA PNEUMONIAE (A)  Final   Report Status 01/21/2018 FINAL  Final   Organism ID, Bacteria KLEBSIELLA PNEUMONIAE (A)  Final      Susceptibility   Klebsiella pneumoniae - MIC*    AMPICILLIN RESISTANT Resistant     CEFAZOLIN <=4 SENSITIVE Sensitive     CEFTRIAXONE <=1 SENSITIVE Sensitive     CIPROFLOXACIN <=0.25 SENSITIVE Sensitive     GENTAMICIN <=1 SENSITIVE Sensitive     IMIPENEM <=0.25 SENSITIVE Sensitive     NITROFURANTOIN 64 INTERMEDIATE Intermediate     TRIMETH/SULFA <=20 SENSITIVE Sensitive     AMPICILLIN/SULBACTAM <=2 SENSITIVE Sensitive     PIP/TAZO <=4 SENSITIVE Sensitive     Extended ESBL NEGATIVE Sensitive     * >=100,000 COLONIES/mL KLEBSIELLA PNEUMONIAE  Blood culture (routine x 2)     Status: Abnormal   Collection  Time: 01/19/18  8:38 AM  Result Value Ref Range Status   Specimen Description   Final    BLOOD RIGHT ARM Performed at [facility] [address], [place], [place] [address]    Special Requests   Final    BOTTLES DRAWN AEROBIC AND ANAEROBIC Blood Culture adequate volume Performed at [facility] [address] [place], [place] [address]    Culture  Setup Time   Final    GRAM NEGATIVE RODS IN BOTH AEROBIC AND ANAEROBIC BOTTLES CRITICAL VALUE NOTED.  VALUE IS CONSISTENT WITH PREVIOUSLY REPORTED AND CALLED VALUE.    Culture (A)  Final    KLEBSIELLA PNEUMONIAE SUSCEPTIBILITIES PERFORMED ON PREVIOUS CULTURE WITHIN THE LAST 5 DAYS. Performed at [facility], [place] [address], [place], [place] [address]    Report Status 01/21/2018 FINAL  Final  Blood culture (routine x 2)     Status: Abnormal   Collection Time: 01/19/18  9:15 AM  Result Value Ref Range Status   Specimen Description   Final    BLOOD RIGHT  HAND Performed at [facility] [address], [place], [place] [address]    Special Requests   Final    BOTTLES DRAWN AEROBIC ONLY Blood Culture adequate volume Performed at [facility] [address] [place], [place] [address]    Culture  Setup Time   Final    GRAM NEGATIVE RODS AEROBIC BOTTLE ONLY CRITICAL RESULT CALLED TO, READ BACK BY AND VERIFIED WITH: Damian Leavell [id number] MLM Performed at [facility], [place] [address], [place], [place] [address]    Culture KLEBSIELLA PNEUMONIAE (A)  Final   Report Status 01/21/2018 FINAL  Final   Organism ID, Bacteria KLEBSIELLA PNEUMONIAE  Final      Susceptibility   Klebsiella pneumoniae - MIC*    AMPICILLIN RESISTANT Resistant     CEFAZOLIN <=4 SENSITIVE Sensitive     CEFEPIME <=1 SENSITIVE Sensitive     CEFTAZIDIME <=1 SENSITIVE Sensitive     CEFTRIAXONE <=1 SENSITIVE Sensitive     CIPROFLOXACIN <=0.25 SENSITIVE Sensitive     GENTAMICIN <=1 SENSITIVE Sensitive     IMIPENEM <=0.25 SENSITIVE Sensitive     TRIMETH/SULFA <=20 SENSITIVE Sensitive     AMPICILLIN/SULBACTAM <=2 SENSITIVE Sensitive     PIP/TAZO <=4 SENSITIVE Sensitive     Extended ESBL NEGATIVE Sensitive     * KLEBSIELLA PNEUMONIAE  Blood Culture ID Panel (Reflexed)     Status: Abnormal   Collection Time: 01/19/18  9:15 AM  Result Value Ref Range Status   Enterococcus species NOT DETECTED NOT DETECTED Final   Listeria monocytogenes NOT DETECTED NOT DETECTED Final   Staphylococcus species NOT DETECTED NOT DETECTED Final   Staphylococcus aureus NOT DETECTED NOT DETECTED Final   Streptococcus species NOT DETECTED NOT DETECTED Final   Streptococcus agalactiae NOT DETECTED NOT DETECTED Final   Streptococcus pneumoniae NOT DETECTED NOT DETECTED Final   Streptococcus pyogenes NOT DETECTED NOT DETECTED Final   Acinetobacter baumannii NOT DETECTED NOT DETECTED Final   Enterobacteriaceae species DETECTED (A) NOT DETECTED Final     Comment: Enterobacteriaceae represent a large family of gram-negative bacteria, not a single organism. CRITICAL RESULT CALLED TO, READ BACK BY AND VERIFIED WITH: Dianna Rossetti GADHIA [id number] 2047 MLM    Enterobacter cloacae complex NOT DETECTED NOT DETECTED Final   Escherichia coli NOT DETECTED NOT DETECTED Final   Klebsiella oxytoca NOT DETECTED NOT DETECTED Final  Klebsiella pneumoniae DETECTED (A) NOT DETECTED Final    Comment: CRITICAL RESULT CALLED TO, READ BACK BY AND VERIFIED WITH: Damian Leavell [id number] 2047 MLM    Proteus species NOT DETECTED NOT DETECTED Final   Serratia marcescens NOT DETECTED NOT DETECTED Final   Carbapenem resistance NOT DETECTED NOT DETECTED Final   Haemophilus influenzae NOT DETECTED NOT DETECTED Final   Neisseria meningitidis NOT DETECTED NOT DETECTED Final   Pseudomonas aeruginosa NOT DETECTED NOT DETECTED Final   Candida albicans NOT DETECTED NOT DETECTED Final   Candida glabrata NOT DETECTED NOT DETECTED Final   Candida krusei NOT DETECTED NOT DETECTED Final   Candida parapsilosis NOT DETECTED NOT DETECTED Final   Candida tropicalis NOT DETECTED NOT DETECTED Final    Comment: Performed at [facility], [place]. [address] [place], [place] [address]  MRSA PCR Screening     Status: None   Collection Time: 01/19/18  7:41 PM  Result Value Ref Range Status   MRSA by PCR NEGATIVE NEGATIVE Final    Comment:        The GeneXpert MRSA Assay (FDA approved for NASAL specimens only), is one component of a comprehensive MRSA colonization surveillance program. It is not intended to diagnose MRSA infection nor to guide or monitor treatment for MRSA infections. Performed at [facility], [place] [address] [place], [place] [address]          Radiology Studies: Dg C-arm 1-60 Min-no Report  Result Date: 01/19/2018 Fluoroscopy was utilized by the requesting physician.  No radiographic interpretation.        Scheduled Meds: .  DULoxetine  60 mg Oral Daily  . hydroxychloroquine  400 mg Oral Daily  . insulin aspart  0-9 Units Subcutaneous TID WC  . insulin glargine  12 Units Subcutaneous QHS  . levothyroxine  200 mcg Oral QAC breakfast  . mouth rinse  15 mL Mouth Rinse BID  . metoprolol tartrate  12.5 mg Oral BID  . oxyCODONE  30 mg Oral Q12H  . pantoprazole  40 mg Oral Daily  . pravastatin  40 mg Oral Daily   Continuous Infusions: . cefTRIAXone (ROCEPHIN)  IV Stopped (01/21/18 7322)     LOS: 2 days     Georgette Shell, MD [facility]  If 7PM-7AM, please contact night-coverage [email or web address] Password [facility] 01/21/2018, 10:54 AM

## 2018-01-22 DIAGNOSIS — A419 Sepsis, unspecified organism: Secondary | ICD-10-CM

## 2018-01-22 DIAGNOSIS — R7881 Bacteremia: Secondary | ICD-10-CM

## 2018-01-22 DIAGNOSIS — L8990 Pressure ulcer of unspecified site, unspecified stage: Secondary | ICD-10-CM

## 2018-01-22 DIAGNOSIS — N200 Calculus of kidney: Secondary | ICD-10-CM

## 2018-01-22 LAB — CBC
HEMATOCRIT: 29.8 % — AB (ref 36.0–46.0)
HEMOGLOBIN: 10 g/dL — AB (ref 12.0–15.0)
MCH: 29.9 pg (ref 26.0–34.0)
MCHC: 33.6 g/dL (ref 30.0–36.0)
MCV: 89 fL (ref 78.0–100.0)
Platelets: 142 10*3/uL — ABNORMAL LOW (ref 150–400)
RBC: 3.35 MIL/uL — ABNORMAL LOW (ref 3.87–5.11)
RDW: 14.2 % (ref 11.5–15.5)
WBC: 21.8 10*3/uL — ABNORMAL HIGH (ref 4.0–10.5)

## 2018-01-22 LAB — BASIC METABOLIC PANEL WITH GFR
Anion gap: 11 (ref 5–15)
BUN: 13 mg/dL (ref 8–23)
CO2: 27 mmol/L (ref 22–32)
Calcium: 7.8 mg/dL — ABNORMAL LOW (ref 8.9–10.3)
Chloride: 100 mmol/L (ref 98–111)
Creatinine, Ser: 0.46 mg/dL (ref 0.44–1.00)
GFR calc Af Amer: >60 mL/min
GFR calc non Af Amer: >60 mL/min
Glucose, Bld: 131 mg/dL — ABNORMAL HIGH (ref 70–99)
Potassium: 3.8 mmol/L (ref 3.5–5.1)
Sodium: 138 mmol/L (ref 135–145)

## 2018-01-22 LAB — GLUCOSE, CAPILLARY
GLUCOSE-CAPILLARY: 139 mg/dL — AB (ref 70–99)
GLUCOSE-CAPILLARY: 118 mg/dL — AB (ref 70–99)

## 2018-01-22 MED ORDER — ONDANSETRON 4 MG PO TBDP
8.0000 mg | ORAL_TABLET | Freq: Once | ORAL | Status: AC
  Administered 2018-01-22: 8 mg via ORAL
  Filled 2018-01-22: qty 2

## 2018-01-22 MED ORDER — CEFDINIR 300 MG PO CAPS: 300.0000 mg | ORAL_CAPSULE | Freq: Two times a day (BID) | ORAL | 0 refills | Status: AC

## 2018-01-22 MED ORDER — ONDANSETRON 4 MG PO TBDP: 4.0000 mg | ORAL_TABLET | Freq: Once | ORAL | Status: DC

## 2018-01-22 NOTE — Progress Notes (Signed)
Patient given discharge, follow up, and medication instructions, verbalized understanding, IV removed, personal belongings with patient, family to transport home  

## 2018-01-22 NOTE — Discharge Summary (Signed)
Physician Discharge Summary  [name]  XTG:[id number]  DOB: January 11, 1951  DOA: 01/19/2018 PCP: Tammy Frees, MD  Admit date: 01/19/2018 Discharge date: 01/22/2018  Admitted From: Home  Disposition:  Home   Recommendations for Outpatient Follow-up:  1. Follow up with PCP in 1 week  2. Please obtain BMP/CBC in one week monitor renal function and hemoglobin 3. Follow-up with urology in 1-2 weeks  Discharge Condition: Stable CODE STATUS: Full code Diet recommendation: Carb Modified  Brief/Interim Summary: For full details see H&P/Progress note, but in brief, [name] is a 67 year old female with medical history significant for rheumatoid arthritis, diabetes mellitus type 2, hypertension, hyperlipidemia, hypothyroidism presented to the emergency department with sudden onset of left flank pain associated with fever, nausea and vomiting.  Upon ED evaluation she was found to be septic with fever T-max one 1.8, tachycardic, WBC 45K, elevated lactic acid and UA grossly abnormal.  CT of the abdomen/pelvis show 7 x 7 x 5 mm calculus in the proximal left ureter slight beyond the UPJ J causing hydronephrosis with extensive edema and perinephric stranding.  Patient was admitted with working diagnosis of sepsis due to UTI complicated with obstructed nephrolithiasis.  Urology was consulted and patient underwent left ureter stenting.  Urine and blood cultures show Klebsiella sensitive to cephalosporins.  Patient was treated with Rocephin.  Patient remained afebrile for 48 hours, tolerating oral diet and with no complaints.  Patient was deemed stable for discharge to follow-up with PCP.  Subjective: Patient seen and examined, today complaining of nausea, however this is chronic patient takes Zofran at home for this.  No vomiting, abdominal pain or diarrhea.  Remains afebrile.  Discharge Diagnoses/Hospital Course:  Sepsis 2/2 to Klebsiella UTI complicated with bacteremia and obstructing left ureteral  stone. Urine urine and blood culture showed Klebsiella sensitive to cephalosporin, patient being treated with Rocephin will transition to [facility] to complete therapy for 10 days.  Obstructing left ureteral stone with hydronephrosis Urology was consulted and patient underwent urgent stenting of the left ureter, pain has improved.  Good urine output.  Neurology recommending outpatient follow-up for possible ureteroscopy or shockwave lithotripsy.   Rheumatoid arthritis Patient on immunotherapy, hold immunotherapy agent until infectious process improved.  Follow-up with PCP. Continue plaquenil.   Hypertension BP stable, continue home medication with no changes  Diabetes mellitus type 2 CBGs stable, continue Lantus and metformin  All other chronic medical condition were stable during the hospitalization.  On the day of the discharge the patient's vitals were stable, and no other acute medical condition were reported by patient. the patient was felt safe to be discharge to home.   Discharge Instructions  You were cared for by a hospitalist during your hospital stay. If you have any questions about your discharge medications or the care you received while you were in the hospital after you are discharged, you can call the unit and asked to speak with the hospitalist on call if the hospitalist that took care of you is not available. Once you are discharged, your primary care physician will handle any further medical issues. Please note that NO REFILLS for any discharge medications will be authorized once you are discharged, as it is imperative that you return to your primary care physician (or establish a relationship with a primary care physician if you do not have one) for your aftercare needs so that they can reassess your need for medications and monitor your lab values.  Discharge Instructions    Call MD for:  difficulty breathing, headache or visual disturbances   Complete by:  As directed     Call MD for:  extreme fatigue   Complete by:  As directed    Call MD for:  hives   Complete by:  As directed    Call MD for:  persistant dizziness or light-headedness   Complete by:  As directed    Call MD for:  persistant nausea and vomiting   Complete by:  As directed    Call MD for:  redness, tenderness, or signs of infection (pain, swelling, redness, odor or green/yellow discharge around incision site)   Complete by:  As directed    Call MD for:  severe uncontrolled pain   Complete by:  As directed    Call MD for:  temperature >100.4   Complete by:  As directed    Diet - low sodium heart healthy   Complete by:  As directed    Increase activity slowly   Complete by:  As directed      Allergies as of 01/22/2018      Reactions   Fish Allergy Anaphylaxis, Hives   Amaryl [glimepiride] Itching   Avalide [irbesartan-hydrochlorothiazide] Itching   Avandia [rosiglitazone] Itching   Byetta 10 Mcg Pen [exenatide] Itching   Codeine Itching   Contrast Media [iodinated Diagnostic Agents] Itching   Demerol [meperidine Hcl] Itching   Glucovance [glyburide-metformin] Itching   Lisinopril Itching   Naprosyn [naproxen] Hives   Penicillins Itching, Other (See Comments)   Has patient had a PCN reaction causing immediate rash, facial/tongue/throat swelling, SOB or lightheadedness with hypotension: no Has patient had a PCN reaction causing severe rash involving mucus membranes or skin necrosis: no Has patient had a PCN reaction that required hospitalization: yes Has patient had a PCN reaction occurring within the last 10 years: no If all of the above answers are "NO", then may proceed with Cephalosporin use.      Medication List    STOP taking these medications   ORENCIA 250 MG injection Generic drug:  abatacept   triamcinolone cream 0.1 % Commonly known as:  KENALOG   valACYclovir 1000 MG tablet Commonly known as:  VALTREX     TAKE these medications   amLODipine 10 MG  tablet Commonly known as:  NORVASC Take 10 mg by mouth at bedtime.   cefdinir 300 MG capsule Commonly known as:  OMNICEF Take 1 capsule (300 mg total) by mouth 2 (two) times daily for 10 days.   cloNIDine 0.3 MG tablet Commonly known as:  CATAPRES Take 0.3 mg by mouth 2 (two) times daily.   diclofenac sodium 1 % Gel Commonly known as:  VOLTAREN Apply 2 g topically as needed (For pain.).   DULoxetine 60 MG capsule Commonly known as:  CYMBALTA Take 60 mg by mouth daily.   esomeprazole 20 MG capsule Commonly known as:  NEXIUM Take 40 mg by mouth daily.   folic acid 1 MG tablet Commonly known as:  FOLVITE Take 1 mg by mouth daily.   HYDROcodone-acetaminophen 10-325 MG tablet Commonly known as:  NORCO Take 1 tablet by mouth every 6 (six) hours as needed (For pain.).   hydrocortisone 25 MG suppository Commonly known as:  ANUSOL-HC Place 25 mg rectally 2 (two) times daily as needed for hemorrhoids.   hydroxychloroquine 200 MG tablet Commonly known as:  PLAQUENIL Take 400 mg by mouth daily.   irbesartan 300 MG tablet Commonly known as:  AVAPRO Take 300 mg by mouth at bedtime.  LANTUS SOLOSTAR 100 UNIT/ML Solostar Pen Generic drug:  Insulin Glargine Inject 16 Units into the skin at bedtime.   levothyroxine 200 MCG tablet Commonly known as:  SYNTHROID, LEVOTHROID Take 200 mcg by mouth daily.   metFORMIN 500 MG 24 hr tablet Commonly known as:  GLUCOPHAGE-XR Take 2,000 mg by mouth daily.   multivitamin with minerals Tabs tablet Take 1 tablet by mouth daily.   NOVOLOG FLEXPEN 100 UNIT/ML FlexPen Generic drug:  insulin aspart Inject 0-20 Units into the skin See admin instructions. Uses a sliding scale.   ondansetron 8 MG disintegrating tablet Commonly known as:  ZOFRAN-ODT Take 8 mg by mouth every 8 (eight) hours as needed for nausea.   OXYCONTIN 30 MG 12 hr tablet Generic drug:  oxyCODONE Take 30 mg by mouth every 12 (twelve) hours.   pravastatin 40 MG  tablet Commonly known as:  PRAVACHOL Take 40 mg by mouth daily.      Follow-up Information    Franchot Gallo, MD Follow up.   Specialty:  Urology Why:  We will call you for proper follow-up. Contact information: [place] [address] [phone]        Tammy Frees, MD. Schedule an appointment as soon as possible for a visit in 1 week(s).   Specialty:  Family Medicine Why:  Hospital follow-up Contact information: [place] [address] [phone]          Allergies  Allergen Reactions  . Fish Allergy Anaphylaxis and Hives  . Amaryl [Glimepiride] Itching  . Avalide [Irbesartan-Hydrochlorothiazide] Itching  . Avandia [Rosiglitazone] Itching  . Byetta 10 Mcg Pen [Exenatide] Itching  . Codeine Itching  . Contrast Media [Iodinated Diagnostic Agents] Itching  . Demerol [Meperidine Hcl] Itching  . Glucovance [Glyburide-Metformin] Itching  . Lisinopril Itching  . Naprosyn [Naproxen] Hives  . Penicillins Itching and Other (See Comments)    Has patient had a PCN reaction causing immediate rash, facial/tongue/throat swelling, SOB or lightheadedness with hypotension: no Has patient had a PCN reaction causing severe rash involving mucus membranes or skin necrosis: no Has patient had a PCN reaction that required hospitalization: yes Has patient had a PCN reaction occurring within the last 10 years: no If all of the above answers are "NO", then may proceed with Cephalosporin use.    Consultations:  Urology    Procedures/Studies: Dg C-arm 1-60 Min-no Report  Result Date: 01/19/2018 Fluoroscopy was utilized by the requesting physician.  No radiographic interpretation.   Ct Renal Stone Study  Addendum Date: 01/19/2018   ADDENDUM REPORT: 01/19/2018 10:37 ADDENDUM: There is air in the urinary bladder. Question instrumentation as the cause. If patient has not had instrumentation in the bladder, gas-forming organism as the  cause must be of concern. Correlation with urinalysis in this regard advised. No intravesical fistula appreciable by CT. Electronically Signed   By: Lowella Grip III M.D.   On: 01/19/2018 10:37   Result Date: 01/19/2018 CLINICAL DATA:  Left flank region pain EXAM: CT ABDOMEN AND PELVIS WITHOUT CONTRAST TECHNIQUE: Multidetector CT imaging of the abdomen and pelvis was performed following the standard protocol without oral or IV contrast. COMPARISON:  March 29, 2005 FINDINGS: Lower chest: There is no edema or consolidation. There are foci coronary artery calcification as well as mitral annulus region calcification. Hepatobiliary: No focal liver lesions are appreciable. Gallbladder is absent. There is no appreciable biliary duct dilatation. Pancreas: Pancreas appears somewhat atrophic. No pancreatic mass or inflammatory focus evident. Spleen:  No splenic lesions are evident. Adrenals/Urinary Tract: Adrenals bilaterally appear unremarkable. 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 There is a probable hyperdense cyst in the mid left kidney measuring 1.9 x 1.4 cm. There is a cyst arising eccentrically from the upper pole of the left kidney posteriorly measuring 2.0 x 1.8 cm. There is extensive left renal edema with perinephric fluid and stranding throughout the perinephric fascia on the left. There is moderate hydronephrosis on the left. There is no appreciable hydronephrosis on the right. On the right, there is a nonobstructing calculus in the mid kidney measuring 6 x 5 mm. On the left, there is a calculus in the proximal left ureter slightly beyond the ureteropelvic junction measuring 7 x 7 x 5 mm. No other ureteral calculi are evident. There is air within the urinary bladder. Urinary bladder wall is not appreciably thickened. Stomach/Bowel: There are descending colonic and sigmoid diverticula without diverticulitis. There is postoperative change  in stomach region. There is no appreciable bowel wall or mesenteric thickening. No evident bowel obstruction. There is no free air or portal venous air. Vascular/Lymphatic: There is aortoiliac atherosclerosis. No aneurysm evident. Major mesenteric arterial vessels appear patent. There is no adenopathy in the abdomen or pelvis. Reproductive: Uterus is absent.  There is no evident pelvic mass. Other: No periappendiceal region inflammation evident. No evident abscess or ascites in the abdomen or pelvis. There is a ventral hernia containing fat but no bowel. Musculoskeletal: Postoperative changes noted in the lower lumbar spine and proximal left femur. No blastic or lytic bone lesions. No intramuscular lesions are evident. IMPRESSION: 1. 7 x 7 x 5 mm calculus in the proximal left ureter slightly beyond the ureteropelvic junction on the left causing moderate hydronephrosis. There is extensive left renal edema and perinephric stranding/fluid. 2.  Nonobstructing calculus right kidney measuring 6 x 5 mm. 3. 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 Probable hyperdense cyst mid left kidney measuring 1.9 x 1.4 cm. 4. Left colonic diverticulosis without diverticulitis. No bowel obstruction. No abscess in the abdomen or pelvis. No periappendiceal region inflammatory change. 5. Postoperative change in the region of the stomach. Uterus absent. Gallbladder absent. Postoperative changes noted in proximal left femur and lower lumbar region. 6. Aortoiliac atherosclerosis. There are foci of coronary artery calcification. 7.  Midline ventral hernia containing fat but no bowel. Aortic Atherosclerosis (ICD10-I70.0). Electronically Signed: By: Lowella Grip III M.D. On: 01/19/2018 09:07   Discharge Exam: Vitals:   01/21/18 2106 01/22/18 0603  BP: 131/73 (!) 143/75  Pulse: 98 98  Resp: 16 16  Temp: 97.8 F (36.6 C) 97.9 F (36.6 C)  SpO2: 93% 96%   Vitals:   01/21/18 1251 01/21/18 1453 01/21/18 2106  01/22/18 0603  BP:  112/67 131/73 (!) 143/75  Pulse:  87 98 98  Resp:  16 16 16   Temp: (!) 97.1 F (36.2 C) 98.1 F (36.7 C) 97.8 F (36.6 C) 97.9 F (36.6 C)  TempSrc: Oral Oral Oral Oral  SpO2:  95% 93% 96%  Weight:  71.5 kg    Height:  5\' 3"  (1.6 m)      General: Pt is alert, awake, not in acute distress Cardiovascular: RRR, S1/S2 +, no rubs, no gallops Respiratory: CTA bilaterally, no wheezing, no rhonchi Abdominal: Soft, NT, ND, bowel sounds + Extremities: no edema, no cyanosis   The results of significant diagnostics from this hospitalization (including imaging, microbiology,  ancillary and laboratory) are listed below for reference.     Microbiology: Recent Results (from the past 240 hour(s))  Urine culture     Status: Abnormal   Collection Time: 01/19/18  7:48 AM  Result Value Ref Range Status   Specimen Description   Final    URINE, CLEAN CATCH Performed at [facility], [place] [address] [place], [place] [address]    Special Requests   Final    NONE Performed at [facility], [place] [address], [place], [place] [address]    Culture >=100,000 COLONIES/mL KLEBSIELLA PNEUMONIAE (A)  Final   Report Status 01/21/2018 FINAL  Final   Organism ID, Bacteria KLEBSIELLA PNEUMONIAE (A)  Final      Susceptibility   Klebsiella pneumoniae - MIC*    AMPICILLIN RESISTANT Resistant     CEFAZOLIN <=4 SENSITIVE Sensitive     CEFTRIAXONE <=1 SENSITIVE Sensitive     CIPROFLOXACIN <=0.25 SENSITIVE Sensitive     GENTAMICIN <=1 SENSITIVE Sensitive     IMIPENEM <=0.25 SENSITIVE Sensitive     NITROFURANTOIN 64 INTERMEDIATE Intermediate     TRIMETH/SULFA <=20 SENSITIVE Sensitive     AMPICILLIN/SULBACTAM <=2 SENSITIVE Sensitive     PIP/TAZO <=4 SENSITIVE Sensitive     Extended ESBL NEGATIVE Sensitive     * >=100,000 COLONIES/mL KLEBSIELLA PNEUMONIAE  Blood culture (routine x 2)     Status: Abnormal   Collection Time: 01/19/18  8:38 AM  Result  Value Ref Range Status   Specimen Description   Final    BLOOD RIGHT ARM Performed at [facility] [address] [place], [place] [address]    Special Requests   Final    BOTTLES DRAWN AEROBIC AND ANAEROBIC Blood Culture adequate volume Performed at [facility] [address], [place], [place] [address]    Culture  Setup Time   Final    GRAM NEGATIVE RODS IN BOTH AEROBIC AND ANAEROBIC BOTTLES CRITICAL VALUE NOTED.  VALUE IS CONSISTENT WITH PREVIOUSLY REPORTED AND CALLED VALUE.    Culture (A)  Final    KLEBSIELLA PNEUMONIAE SUSCEPTIBILITIES PERFORMED ON PREVIOUS CULTURE WITHIN THE LAST 5 DAYS. Performed at [facility], [place] [address] [place], [place] [address]    Report Status 01/21/2018 FINAL  Final  Blood culture (routine x 2)     Status: Abnormal   Collection Time: 01/19/18  9:15 AM  Result Value Ref Range Status   Specimen Description   Final    BLOOD RIGHT HAND Performed at [facility] [address] [place], [place] [address]    Special Requests   Final    BOTTLES DRAWN AEROBIC ONLY Blood Culture adequate volume Performed at [facility] [address], [place], [place] [address]    Culture  Setup Time   Final    GRAM NEGATIVE RODS AEROBIC BOTTLE ONLY CRITICAL RESULT CALLED TO, READ BACK BY AND VERIFIED WITH: Damian Leavell [id number] MLM Performed at [facility], [place] [address] [place], [place] [address]    Culture KLEBSIELLA PNEUMONIAE (A)  Final   Report Status 01/21/2018 FINAL  Final   Organism ID, Bacteria KLEBSIELLA PNEUMONIAE  Final      Susceptibility   Klebsiella pneumoniae - MIC*    AMPICILLIN RESISTANT Resistant     CEFAZOLIN <=4 SENSITIVE Sensitive     CEFEPIME <=1 SENSITIVE Sensitive     CEFTAZIDIME <=1 SENSITIVE Sensitive     CEFTRIAXONE <=  1 SENSITIVE Sensitive     CIPROFLOXACIN <=0.25 SENSITIVE Sensitive     GENTAMICIN <=1 SENSITIVE Sensitive      IMIPENEM <=0.25 SENSITIVE Sensitive     TRIMETH/SULFA <=20 SENSITIVE Sensitive     AMPICILLIN/SULBACTAM <=2 SENSITIVE Sensitive     PIP/TAZO <=4 SENSITIVE Sensitive     Extended ESBL NEGATIVE Sensitive     * KLEBSIELLA PNEUMONIAE  Blood Culture ID Panel (Reflexed)     Status: Abnormal   Collection Time: 01/19/18  9:15 AM  Result Value Ref Range Status   Enterococcus species NOT DETECTED NOT DETECTED Final   Listeria monocytogenes NOT DETECTED NOT DETECTED Final   Staphylococcus species NOT DETECTED NOT DETECTED Final   Staphylococcus aureus NOT DETECTED NOT DETECTED Final   Streptococcus species NOT DETECTED NOT DETECTED Final   Streptococcus agalactiae NOT DETECTED NOT DETECTED Final   Streptococcus pneumoniae NOT DETECTED NOT DETECTED Final   Streptococcus pyogenes NOT DETECTED NOT DETECTED Final   Acinetobacter baumannii NOT DETECTED NOT DETECTED Final   Enterobacteriaceae species DETECTED (A) NOT DETECTED Final    Comment: Enterobacteriaceae represent a large family of gram-negative bacteria, not a single organism. CRITICAL RESULT CALLED TO, READ BACK BY AND VERIFIED WITH: Damian Leavell [id number] MLM    Enterobacter cloacae complex NOT DETECTED NOT DETECTED Final   Escherichia coli NOT DETECTED NOT DETECTED Final   Klebsiella oxytoca NOT DETECTED NOT DETECTED Final   Klebsiella pneumoniae DETECTED (A) NOT DETECTED Final    Comment: CRITICAL RESULT CALLED TO, READ BACK BY AND VERIFIED WITH: Damian Leavell [id number] MLM    Proteus species NOT DETECTED NOT DETECTED Final   Serratia marcescens NOT DETECTED NOT DETECTED Final   Carbapenem resistance NOT DETECTED NOT DETECTED Final   Haemophilus influenzae NOT DETECTED NOT DETECTED Final   Neisseria meningitidis NOT DETECTED NOT DETECTED Final   Pseudomonas aeruginosa NOT DETECTED NOT DETECTED Final   Candida albicans NOT DETECTED NOT DETECTED Final   Candida glabrata NOT DETECTED NOT DETECTED Final   Candida krusei NOT  DETECTED NOT DETECTED Final   Candida parapsilosis NOT DETECTED NOT DETECTED Final   Candida tropicalis NOT DETECTED NOT DETECTED Final    Comment: Performed at [facility], [place]. [address], [place], [place] [address]  MRSA PCR Screening     Status: None   Collection Time: 01/19/18  7:41 PM  Result Value Ref Range Status   MRSA by PCR NEGATIVE NEGATIVE Final    Comment:        The GeneXpert MRSA Assay (FDA approved for NASAL specimens only), is one component of a comprehensive MRSA colonization surveillance program. It is not intended to diagnose MRSA infection nor to guide or monitor treatment for MRSA infections. Performed at [facility], [place] [address], [place], [place] [address]      Labs: BNP (last 3 results) No results for input(s): BNP in the last 8760 hours. Basic Metabolic Panel: Recent Labs  Lab 01/19/18 0748 01/20/18 0344 01/21/18 0403 01/22/18 0518  NA 141 141 141 138  K 4.4 3.7 3.6 3.8  CL 102 104 105 100  CO2 24 27 28 27   GLUCOSE 256* 139* 131* 131*  BUN 13 13 11 13   CREATININE 0.69 0.59 0.50 0.46  CALCIUM 9.6 7.8* 7.7* 7.8*   Liver Function Tests: Recent Labs  Lab 01/20/18 0344  AST 87*  ALT 133*  ALKPHOS 41  BILITOT 1.0  PROT 5.9*  ALBUMIN 2.8*   No results  for input(s): LIPASE, AMYLASE in the last 168 hours. No results for input(s): AMMONIA in the last 168 hours. CBC: Recent Labs  Lab 01/19/18 0748 01/20/18 0344 01/21/18 0403 01/22/18 0518  WBC 26.8* 45.6* 29.2* 21.8*  HGB 13.5 11.2* 9.9* 10.0*  HCT 40.0 34.3* 30.6* 29.8*  MCV 90.1 91.5 91.3 89.0  PLT 341 206 135* 142*   Cardiac Enzymes: No results for input(s): CKTOTAL, CKMB, CKMBINDEX, TROPONINI in the last 168 hours. BNP: Invalid input(s): POCBNP CBG: Recent Labs  Lab 01/21/18 1216 01/21/18 1646 01/21/18 2116 01/22/18 0748 01/22/18 1230  GLUCAP 161* 145* 121* 139* 118*   D-Dimer No results for input(s): DDIMER in the last 72 hours. Hgb  A1c No results for input(s): HGBA1C in the last 72 hours. Lipid Profile No results for input(s): CHOL, HDL, LDLCALC, TRIG, CHOLHDL, LDLDIRECT in the last 72 hours. Thyroid function studies No results for input(s): TSH, T4TOTAL, T3FREE, THYROIDAB in the last 72 hours.  Invalid input(s): FREET3 Anemia work up No results for input(s): VITAMINB12, FOLATE, FERRITIN, TIBC, IRON, RETICCTPCT in the last 72 hours. Urinalysis    Component Value Date/Time   COLORURINE YELLOW 01/19/2018 0748   APPEARANCEUR HAZY (A) 01/19/2018 0748   LABSPEC 1.012 01/19/2018 0748   PHURINE 5.0 01/19/2018 0748   GLUCOSEU >=500 (A) 01/19/2018 0748   HGBUR MODERATE (A) 01/19/2018 0748   BILIRUBINUR NEGATIVE 01/19/2018 0748   KETONESUR NEGATIVE 01/19/2018 0748   PROTEINUR 30 (A) 01/19/2018 0748   NITRITE NEGATIVE 01/19/2018 0748   LEUKOCYTESUR MODERATE (A) 01/19/2018 0748   Sepsis Labs Invalid input(s): PROCALCITONIN,  WBC,  LACTICIDVEN Microbiology Recent Results (from the past 240 hour(s))  Urine culture     Status: Abnormal   Collection Time: 01/19/18  7:48 AM  Result Value Ref Range Status   Specimen Description   Final    URINE, CLEAN CATCH Performed at [facility], [place] [address] [place], [place] [address]    Special Requests   Final    NONE Performed at [facility], [place] [address] [place], [place] [address]    Culture >=100,000 COLONIES/mL KLEBSIELLA PNEUMONIAE (A)  Final   Report Status 01/21/2018 FINAL  Final   Organism ID, Bacteria KLEBSIELLA PNEUMONIAE (A)  Final      Susceptibility   Klebsiella pneumoniae - MIC*    AMPICILLIN RESISTANT Resistant     CEFAZOLIN <=4 SENSITIVE Sensitive     CEFTRIAXONE <=1 SENSITIVE Sensitive     CIPROFLOXACIN <=0.25 SENSITIVE Sensitive     GENTAMICIN <=1 SENSITIVE Sensitive     IMIPENEM <=0.25 SENSITIVE Sensitive     NITROFURANTOIN 64 INTERMEDIATE Intermediate     TRIMETH/SULFA <=20 SENSITIVE Sensitive      AMPICILLIN/SULBACTAM <=2 SENSITIVE Sensitive     PIP/TAZO <=4 SENSITIVE Sensitive     Extended ESBL NEGATIVE Sensitive     * >=100,000 COLONIES/mL KLEBSIELLA PNEUMONIAE  Blood culture (routine x 2)     Status: Abnormal   Collection Time: 01/19/18  8:38 AM  Result Value Ref Range Status   Specimen Description   Final    BLOOD RIGHT ARM Performed at [facility] [address], [place], [place] [address]    Special Requests   Final    BOTTLES DRAWN AEROBIC AND ANAEROBIC Blood Culture adequate volume Performed at [facility] [address] [place], [place] [address]    Culture  Setup Time   Final    GRAM NEGATIVE RODS IN BOTH AEROBIC AND ANAEROBIC  BOTTLES CRITICAL VALUE NOTED.  VALUE IS CONSISTENT WITH PREVIOUSLY REPORTED AND CALLED VALUE.    Culture (A)  Final    KLEBSIELLA PNEUMONIAE SUSCEPTIBILITIES PERFORMED ON PREVIOUS CULTURE WITHIN THE LAST 5 DAYS. Performed at [facility], [place] [address], [place], [place] [address]    Report Status 01/21/2018 FINAL  Final  Blood culture (routine x 2)     Status: Abnormal   Collection Time: 01/19/18  9:15 AM  Result Value Ref Range Status   Specimen Description   Final    BLOOD RIGHT HAND Performed at [facility] [address], [place], [place] [address]    Special Requests   Final    BOTTLES DRAWN AEROBIC ONLY Blood Culture adequate volume Performed at [facility] [address] [place], [place] [address]    Culture  Setup Time   Final    GRAM NEGATIVE RODS AEROBIC BOTTLE ONLY CRITICAL RESULT CALLED TO, READ BACK BY AND VERIFIED WITH: Damian Leavell [id number] MLM Performed at [facility], [place] [address], [place], [place] [address]    Culture KLEBSIELLA PNEUMONIAE (A)  Final   Report Status 01/21/2018 FINAL  Final   Organism ID, Bacteria KLEBSIELLA PNEUMONIAE  Final      Susceptibility   Klebsiella pneumoniae - MIC*    AMPICILLIN  RESISTANT Resistant     CEFAZOLIN <=4 SENSITIVE Sensitive     CEFEPIME <=1 SENSITIVE Sensitive     CEFTAZIDIME <=1 SENSITIVE Sensitive     CEFTRIAXONE <=1 SENSITIVE Sensitive     CIPROFLOXACIN <=0.25 SENSITIVE Sensitive     GENTAMICIN <=1 SENSITIVE Sensitive     IMIPENEM <=0.25 SENSITIVE Sensitive     TRIMETH/SULFA <=20 SENSITIVE Sensitive     AMPICILLIN/SULBACTAM <=2 SENSITIVE Sensitive     PIP/TAZO <=4 SENSITIVE Sensitive     Extended ESBL NEGATIVE Sensitive     * KLEBSIELLA PNEUMONIAE  Blood Culture ID Panel (Reflexed)     Status: Abnormal   Collection Time: 01/19/18  9:15 AM  Result Value Ref Range Status   Enterococcus species NOT DETECTED NOT DETECTED Final   Listeria monocytogenes NOT DETECTED NOT DETECTED Final   Staphylococcus species NOT DETECTED NOT DETECTED Final   Staphylococcus aureus NOT DETECTED NOT DETECTED Final   Streptococcus species NOT DETECTED NOT DETECTED Final   Streptococcus agalactiae NOT DETECTED NOT DETECTED Final   Streptococcus pneumoniae NOT DETECTED NOT DETECTED Final   Streptococcus pyogenes NOT DETECTED NOT DETECTED Final   Acinetobacter baumannii NOT DETECTED NOT DETECTED Final   Enterobacteriaceae species DETECTED (A) NOT DETECTED Final    Comment: Enterobacteriaceae represent a large family of gram-negative bacteria, not a single organism. CRITICAL RESULT CALLED TO, READ BACK BY AND VERIFIED WITH: Damian Leavell [id number] MLM    Enterobacter cloacae complex NOT DETECTED NOT DETECTED Final   Escherichia coli NOT DETECTED NOT DETECTED Final   Klebsiella oxytoca NOT DETECTED NOT DETECTED Final   Klebsiella pneumoniae DETECTED (A) NOT DETECTED Final    Comment: CRITICAL RESULT CALLED TO, READ BACK BY AND VERIFIED WITH: Damian Leavell [id number] MLM    Proteus species NOT DETECTED NOT DETECTED Final   Serratia marcescens NOT DETECTED NOT DETECTED Final   Carbapenem resistance NOT DETECTED NOT DETECTED Final   Haemophilus influenzae NOT  DETECTED NOT DETECTED Final   Neisseria meningitidis NOT DETECTED NOT DETECTED Final   Pseudomonas aeruginosa NOT DETECTED NOT DETECTED Final   Candida albicans NOT DETECTED NOT DETECTED  Final   Candida glabrata NOT DETECTED NOT DETECTED Final   Candida krusei NOT DETECTED NOT DETECTED Final   Candida parapsilosis NOT DETECTED NOT DETECTED Final   Candida tropicalis NOT DETECTED NOT DETECTED Final    Comment: Performed at [facility], [place] [address], [place], [place] [address]  MRSA PCR Screening     Status: None   Collection Time: 01/19/18  7:41 PM  Result Value Ref Range Status   MRSA by PCR NEGATIVE NEGATIVE Final    Comment:        The GeneXpert MRSA Assay (FDA approved for NASAL specimens only), is one component of a comprehensive MRSA colonization surveillance program. It is not intended to diagnose MRSA infection nor to guide or monitor treatment for MRSA infections. Performed at [facility], [place] [address] [place],  [address]     Time coordinating discharge: 35 minutes  SIGNED:  Chipper Oman, MD  [facility] 01/22/2018, 1:48 PM  Pager please text page via  [email or web address]  Note - This record has been created using [employer]. Chart creation errors have been sought, but may not always have been located. Such creation errors do not reflect on the standard of medical care.

## 2018-01-29 DIAGNOSIS — N132 Hydronephrosis with renal and ureteral calculous obstruction: Secondary | ICD-10-CM | POA: Diagnosis not present

## 2018-01-31 DIAGNOSIS — N202 Calculus of kidney with calculus of ureter: Secondary | ICD-10-CM | POA: Diagnosis not present

## 2018-02-06 ENCOUNTER — Other Ambulatory Visit: Payer: Self-pay | Admitting: Urology

## 2018-02-08 DIAGNOSIS — Z79899 Other long term (current) drug therapy: Secondary | ICD-10-CM | POA: Diagnosis not present

## 2018-02-08 DIAGNOSIS — M0579 Rheumatoid arthritis with rheumatoid factor of multiple sites without organ or systems involvement: Secondary | ICD-10-CM | POA: Diagnosis not present

## 2018-02-12 NOTE — Progress Notes (Signed)
Patient informed to arrive 0900 on 03/18/18 for ESWL. NPO after 5AM on 03/18/18. She will have breakfast at 0445 AM and take her PM insulin the night before surgery. No aspirin or NSAIDS. She will arrange for a responsible driver to take her home after the procedure.

## 2018-02-15 ENCOUNTER — Other Ambulatory Visit: Payer: Self-pay

## 2018-02-15 ENCOUNTER — Encounter (HOSPITAL_COMMUNITY): Payer: Self-pay | Admitting: *Deleted

## 2018-02-15 ENCOUNTER — Ambulatory Visit (HOSPITAL_COMMUNITY)
Admission: RE | Admit: 2018-02-15 | Discharge: 2018-02-15 | Disposition: A | Discharge status: Home / Self Care | Payer: Medicare Other | Source: Ambulatory Visit | Attending: Urology | Admitting: Urology

## 2018-02-15 ENCOUNTER — Ambulatory Visit (HOSPITAL_COMMUNITY): Payer: Medicare Other

## 2018-02-15 ENCOUNTER — Encounter (HOSPITAL_COMMUNITY)
Admission: RE | Disposition: A | Discharge status: Home / Self Care | Payer: Self-pay | Source: Ambulatory Visit | Attending: Urology

## 2018-02-15 DIAGNOSIS — Z91041 Radiographic dye allergy status: Secondary | ICD-10-CM | POA: Insufficient documentation

## 2018-02-15 DIAGNOSIS — Z79899 Other long term (current) drug therapy: Secondary | ICD-10-CM | POA: Insufficient documentation

## 2018-02-15 DIAGNOSIS — Z7984 Long term (current) use of oral hypoglycemic drugs: Secondary | ICD-10-CM | POA: Diagnosis not present

## 2018-02-15 DIAGNOSIS — N200 Calculus of kidney: Secondary | ICD-10-CM | POA: Insufficient documentation

## 2018-02-15 DIAGNOSIS — F329 Major depressive disorder, single episode, unspecified: Secondary | ICD-10-CM | POA: Diagnosis not present

## 2018-02-15 DIAGNOSIS — E039 Hypothyroidism, unspecified: Secondary | ICD-10-CM | POA: Diagnosis not present

## 2018-02-15 DIAGNOSIS — I10 Essential (primary) hypertension: Secondary | ICD-10-CM | POA: Insufficient documentation

## 2018-02-15 DIAGNOSIS — M069 Rheumatoid arthritis, unspecified: Secondary | ICD-10-CM | POA: Insufficient documentation

## 2018-02-15 DIAGNOSIS — E119 Type 2 diabetes mellitus without complications: Secondary | ICD-10-CM | POA: Insufficient documentation

## 2018-02-15 DIAGNOSIS — Z888 Allergy status to other drugs, medicaments and biological substances status: Secondary | ICD-10-CM | POA: Diagnosis not present

## 2018-02-15 HISTORY — PX: EXTRACORPOREAL SHOCK WAVE LITHOTRIPSY: SHX1557

## 2018-02-15 LAB — GLUCOSE, CAPILLARY
GLUCOSE-CAPILLARY: 122 mg/dL — AB (ref 70–99)
GLUCOSE-CAPILLARY: 120 mg/dL — AB (ref 70–99)

## 2018-02-15 SURGERY — LITHOTRIPSY, ESWL
Anesthesia: LOCAL | Laterality: Left

## 2018-02-15 MED ORDER — SODIUM CHLORIDE 0.9 % IV SOLN
INTRAVENOUS | Status: DC
  Administered 2018-02-15: 07:00:00 via INTRAVENOUS

## 2018-02-15 MED ORDER — DIAZEPAM 5 MG PO TABS
10.0000 mg | ORAL_TABLET | ORAL | Status: AC
  Administered 2018-02-15: 10 mg via ORAL
  Filled 2018-02-15: qty 2

## 2018-02-15 MED ORDER — LEVOFLOXACIN 500 MG PO TABS
500.0000 mg | ORAL_TABLET | ORAL | Status: AC
  Administered 2018-02-15: 500 mg via ORAL
  Filled 2018-02-15: qty 1

## 2018-02-15 MED ORDER — DIPHENHYDRAMINE HCL 25 MG PO CAPS
25.0000 mg | ORAL_CAPSULE | ORAL | Status: AC
  Administered 2018-02-15: 25 mg via ORAL
  Filled 2018-02-15: qty 1

## 2018-02-16 ENCOUNTER — Encounter (HOSPITAL_COMMUNITY): Payer: Self-pay | Admitting: Urology

## 2018-02-22 DIAGNOSIS — M810 Age-related osteoporosis without current pathological fracture: Secondary | ICD-10-CM | POA: Diagnosis not present

## 2018-03-05 DIAGNOSIS — N202 Calculus of kidney with calculus of ureter: Secondary | ICD-10-CM | POA: Diagnosis not present

## 2018-03-15 DIAGNOSIS — N202 Calculus of kidney with calculus of ureter: Secondary | ICD-10-CM | POA: Diagnosis not present

## 2018-03-15 DIAGNOSIS — M0579 Rheumatoid arthritis with rheumatoid factor of multiple sites without organ or systems involvement: Secondary | ICD-10-CM | POA: Diagnosis not present

## 2018-04-05 DIAGNOSIS — K219 Gastro-esophageal reflux disease without esophagitis: Secondary | ICD-10-CM | POA: Diagnosis not present

## 2018-04-05 DIAGNOSIS — E119 Type 2 diabetes mellitus without complications: Secondary | ICD-10-CM | POA: Diagnosis not present

## 2018-04-05 DIAGNOSIS — Z794 Long term (current) use of insulin: Secondary | ICD-10-CM | POA: Diagnosis not present

## 2018-04-05 DIAGNOSIS — R0602 Shortness of breath: Secondary | ICD-10-CM | POA: Diagnosis not present

## 2018-04-05 DIAGNOSIS — E7800 Pure hypercholesterolemia, unspecified: Secondary | ICD-10-CM | POA: Diagnosis not present

## 2018-04-05 DIAGNOSIS — M069 Rheumatoid arthritis, unspecified: Secondary | ICD-10-CM | POA: Diagnosis not present

## 2018-04-05 DIAGNOSIS — E039 Hypothyroidism, unspecified: Secondary | ICD-10-CM | POA: Diagnosis not present

## 2018-04-05 DIAGNOSIS — I10 Essential (primary) hypertension: Secondary | ICD-10-CM | POA: Diagnosis not present

## 2018-04-05 DIAGNOSIS — F324 Major depressive disorder, single episode, in partial remission: Secondary | ICD-10-CM | POA: Diagnosis not present

## 2018-04-12 DIAGNOSIS — M0579 Rheumatoid arthritis with rheumatoid factor of multiple sites without organ or systems involvement: Secondary | ICD-10-CM | POA: Diagnosis not present

## 2018-04-17 ENCOUNTER — Ambulatory Visit
Admission: RE | Admit: 2018-04-17 | Discharge: 2018-04-17 | Disposition: A | Discharge status: Home / Self Care | Payer: Medicare Other | Source: Ambulatory Visit | Attending: Family Medicine | Admitting: Family Medicine

## 2018-04-17 ENCOUNTER — Other Ambulatory Visit: Payer: Self-pay | Admitting: Family Medicine

## 2018-04-17 DIAGNOSIS — Z6824 Body mass index (BMI) 24.0-24.9, adult: Secondary | ICD-10-CM | POA: Diagnosis not present

## 2018-04-17 DIAGNOSIS — R0602 Shortness of breath: Secondary | ICD-10-CM

## 2018-04-17 DIAGNOSIS — M2550 Pain in unspecified joint: Secondary | ICD-10-CM | POA: Diagnosis not present

## 2018-04-17 DIAGNOSIS — G894 Chronic pain syndrome: Secondary | ICD-10-CM | POA: Diagnosis not present

## 2018-04-17 DIAGNOSIS — M810 Age-related osteoporosis without current pathological fracture: Secondary | ICD-10-CM | POA: Diagnosis not present

## 2018-04-17 DIAGNOSIS — Z79899 Other long term (current) drug therapy: Secondary | ICD-10-CM | POA: Diagnosis not present

## 2018-04-17 DIAGNOSIS — M0579 Rheumatoid arthritis with rheumatoid factor of multiple sites without organ or systems involvement: Secondary | ICD-10-CM | POA: Diagnosis not present

## 2018-05-10 DIAGNOSIS — M0579 Rheumatoid arthritis with rheumatoid factor of multiple sites without organ or systems involvement: Secondary | ICD-10-CM | POA: Diagnosis not present

## 2018-05-10 DIAGNOSIS — Z79899 Other long term (current) drug therapy: Secondary | ICD-10-CM | POA: Diagnosis not present

## 2018-05-14 ENCOUNTER — Ambulatory Visit
Admission: RE | Admit: 2018-05-14 | Discharge: 2018-05-14 | Disposition: A | Discharge status: Home / Self Care | Payer: Medicare Other | Source: Ambulatory Visit | Attending: Family Medicine | Admitting: Family Medicine

## 2018-05-14 ENCOUNTER — Other Ambulatory Visit: Payer: Self-pay | Admitting: Family Medicine

## 2018-05-14 DIAGNOSIS — R9389 Abnormal findings on diagnostic imaging of other specified body structures: Secondary | ICD-10-CM

## 2018-05-14 DIAGNOSIS — J984 Other disorders of lung: Secondary | ICD-10-CM | POA: Diagnosis not present

## 2018-05-14 DIAGNOSIS — R8279 Other abnormal findings on microbiological examination of urine: Secondary | ICD-10-CM | POA: Diagnosis not present

## 2018-05-14 DIAGNOSIS — N202 Calculus of kidney with calculus of ureter: Secondary | ICD-10-CM | POA: Diagnosis not present

## 2018-06-07 DIAGNOSIS — M0579 Rheumatoid arthritis with rheumatoid factor of multiple sites without organ or systems involvement: Secondary | ICD-10-CM | POA: Diagnosis not present

## 2018-06-14 DIAGNOSIS — N202 Calculus of kidney with calculus of ureter: Secondary | ICD-10-CM | POA: Diagnosis not present

## 2018-06-14 DIAGNOSIS — N390 Urinary tract infection, site not specified: Secondary | ICD-10-CM | POA: Diagnosis not present

## 2018-07-05 DIAGNOSIS — M0579 Rheumatoid arthritis with rheumatoid factor of multiple sites without organ or systems involvement: Secondary | ICD-10-CM | POA: Diagnosis not present

## 2018-07-23 DIAGNOSIS — E119 Type 2 diabetes mellitus without complications: Secondary | ICD-10-CM | POA: Diagnosis not present

## 2018-07-23 DIAGNOSIS — G894 Chronic pain syndrome: Secondary | ICD-10-CM | POA: Diagnosis not present

## 2018-07-23 DIAGNOSIS — F324 Major depressive disorder, single episode, in partial remission: Secondary | ICD-10-CM | POA: Diagnosis not present

## 2018-07-23 DIAGNOSIS — I10 Essential (primary) hypertension: Secondary | ICD-10-CM | POA: Diagnosis not present

## 2018-07-23 DIAGNOSIS — M069 Rheumatoid arthritis, unspecified: Secondary | ICD-10-CM | POA: Diagnosis not present

## 2018-07-23 DIAGNOSIS — Z794 Long term (current) use of insulin: Secondary | ICD-10-CM | POA: Diagnosis not present

## 2018-07-23 DIAGNOSIS — E7800 Pure hypercholesterolemia, unspecified: Secondary | ICD-10-CM | POA: Diagnosis not present

## 2018-07-23 DIAGNOSIS — E039 Hypothyroidism, unspecified: Secondary | ICD-10-CM | POA: Diagnosis not present

## 2018-07-23 DIAGNOSIS — Z853 Personal history of malignant neoplasm of breast: Secondary | ICD-10-CM | POA: Diagnosis not present

## 2018-08-02 DIAGNOSIS — M0579 Rheumatoid arthritis with rheumatoid factor of multiple sites without organ or systems involvement: Secondary | ICD-10-CM | POA: Diagnosis not present

## 2018-08-27 DIAGNOSIS — M810 Age-related osteoporosis without current pathological fracture: Secondary | ICD-10-CM | POA: Diagnosis not present

## 2018-08-30 DIAGNOSIS — M0579 Rheumatoid arthritis with rheumatoid factor of multiple sites without organ or systems involvement: Secondary | ICD-10-CM | POA: Diagnosis not present

## 2018-09-17 DIAGNOSIS — N202 Calculus of kidney with calculus of ureter: Secondary | ICD-10-CM | POA: Diagnosis not present

## 2018-09-27 DIAGNOSIS — M0579 Rheumatoid arthritis with rheumatoid factor of multiple sites without organ or systems involvement: Secondary | ICD-10-CM | POA: Diagnosis not present

## 2018-10-17 DIAGNOSIS — Z79899 Other long term (current) drug therapy: Secondary | ICD-10-CM | POA: Diagnosis not present

## 2018-10-17 DIAGNOSIS — M810 Age-related osteoporosis without current pathological fracture: Secondary | ICD-10-CM | POA: Diagnosis not present

## 2018-10-17 DIAGNOSIS — M0579 Rheumatoid arthritis with rheumatoid factor of multiple sites without organ or systems involvement: Secondary | ICD-10-CM | POA: Diagnosis not present

## 2018-10-17 DIAGNOSIS — Z6824 Body mass index (BMI) 24.0-24.9, adult: Secondary | ICD-10-CM | POA: Diagnosis not present

## 2018-10-17 DIAGNOSIS — M2550 Pain in unspecified joint: Secondary | ICD-10-CM | POA: Diagnosis not present

## 2018-10-25 DIAGNOSIS — M0579 Rheumatoid arthritis with rheumatoid factor of multiple sites without organ or systems involvement: Secondary | ICD-10-CM | POA: Diagnosis not present

## 2018-10-29 DIAGNOSIS — Z1211 Encounter for screening for malignant neoplasm of colon: Secondary | ICD-10-CM | POA: Diagnosis not present

## 2018-10-29 DIAGNOSIS — Z853 Personal history of malignant neoplasm of breast: Secondary | ICD-10-CM | POA: Diagnosis not present

## 2018-10-29 DIAGNOSIS — E119 Type 2 diabetes mellitus without complications: Secondary | ICD-10-CM | POA: Diagnosis not present

## 2018-10-29 DIAGNOSIS — K219 Gastro-esophageal reflux disease without esophagitis: Secondary | ICD-10-CM | POA: Diagnosis not present

## 2018-10-29 DIAGNOSIS — M069 Rheumatoid arthritis, unspecified: Secondary | ICD-10-CM | POA: Diagnosis not present

## 2018-10-29 DIAGNOSIS — E039 Hypothyroidism, unspecified: Secondary | ICD-10-CM | POA: Diagnosis not present

## 2018-10-29 DIAGNOSIS — E7800 Pure hypercholesterolemia, unspecified: Secondary | ICD-10-CM | POA: Diagnosis not present

## 2018-10-29 DIAGNOSIS — G894 Chronic pain syndrome: Secondary | ICD-10-CM | POA: Diagnosis not present

## 2018-10-29 DIAGNOSIS — J4520 Mild intermittent asthma, uncomplicated: Secondary | ICD-10-CM | POA: Diagnosis not present

## 2018-10-29 DIAGNOSIS — I10 Essential (primary) hypertension: Secondary | ICD-10-CM | POA: Diagnosis not present

## 2018-10-29 DIAGNOSIS — F324 Major depressive disorder, single episode, in partial remission: Secondary | ICD-10-CM | POA: Diagnosis not present

## 2018-11-22 DIAGNOSIS — M0579 Rheumatoid arthritis with rheumatoid factor of multiple sites without organ or systems involvement: Secondary | ICD-10-CM | POA: Diagnosis not present

## 2018-12-27 DIAGNOSIS — Z79899 Other long term (current) drug therapy: Secondary | ICD-10-CM | POA: Diagnosis not present

## 2018-12-27 DIAGNOSIS — M0579 Rheumatoid arthritis with rheumatoid factor of multiple sites without organ or systems involvement: Secondary | ICD-10-CM | POA: Diagnosis not present

## 2019-01-24 DIAGNOSIS — M0579 Rheumatoid arthritis with rheumatoid factor of multiple sites without organ or systems involvement: Secondary | ICD-10-CM | POA: Diagnosis not present

## 2019-01-30 DIAGNOSIS — M069 Rheumatoid arthritis, unspecified: Secondary | ICD-10-CM | POA: Diagnosis not present

## 2019-01-30 DIAGNOSIS — Z1211 Encounter for screening for malignant neoplasm of colon: Secondary | ICD-10-CM | POA: Diagnosis not present

## 2019-01-30 DIAGNOSIS — I10 Essential (primary) hypertension: Secondary | ICD-10-CM | POA: Diagnosis not present

## 2019-01-30 DIAGNOSIS — E039 Hypothyroidism, unspecified: Secondary | ICD-10-CM | POA: Diagnosis not present

## 2019-01-30 DIAGNOSIS — E119 Type 2 diabetes mellitus without complications: Secondary | ICD-10-CM | POA: Diagnosis not present

## 2019-01-30 DIAGNOSIS — Z853 Personal history of malignant neoplasm of breast: Secondary | ICD-10-CM | POA: Diagnosis not present

## 2019-01-30 DIAGNOSIS — G894 Chronic pain syndrome: Secondary | ICD-10-CM | POA: Diagnosis not present

## 2019-01-30 DIAGNOSIS — F324 Major depressive disorder, single episode, in partial remission: Secondary | ICD-10-CM | POA: Diagnosis not present

## 2019-01-30 DIAGNOSIS — Z23 Encounter for immunization: Secondary | ICD-10-CM | POA: Diagnosis not present

## 2019-01-30 DIAGNOSIS — E7800 Pure hypercholesterolemia, unspecified: Secondary | ICD-10-CM | POA: Diagnosis not present

## 2019-02-21 DIAGNOSIS — M0579 Rheumatoid arthritis with rheumatoid factor of multiple sites without organ or systems involvement: Secondary | ICD-10-CM | POA: Diagnosis not present

## 2019-03-13 DIAGNOSIS — M810 Age-related osteoporosis without current pathological fracture: Secondary | ICD-10-CM | POA: Diagnosis not present

## 2019-03-21 DIAGNOSIS — M0579 Rheumatoid arthritis with rheumatoid factor of multiple sites without organ or systems involvement: Secondary | ICD-10-CM | POA: Diagnosis not present

## 2019-03-21 DIAGNOSIS — Z79899 Other long term (current) drug therapy: Secondary | ICD-10-CM | POA: Diagnosis not present

## 2019-05-17 DIAGNOSIS — M0579 Rheumatoid arthritis with rheumatoid factor of multiple sites without organ or systems involvement: Secondary | ICD-10-CM | POA: Diagnosis not present

## 2019-06-14 DIAGNOSIS — Z79899 Other long term (current) drug therapy: Secondary | ICD-10-CM | POA: Diagnosis not present

## 2019-06-14 DIAGNOSIS — M0579 Rheumatoid arthritis with rheumatoid factor of multiple sites without organ or systems involvement: Secondary | ICD-10-CM | POA: Diagnosis not present

## 2019-07-12 DIAGNOSIS — M0579 Rheumatoid arthritis with rheumatoid factor of multiple sites without organ or systems involvement: Secondary | ICD-10-CM | POA: Diagnosis not present

## 2019-08-01 DIAGNOSIS — E119 Type 2 diabetes mellitus without complications: Secondary | ICD-10-CM | POA: Diagnosis not present

## 2019-08-01 DIAGNOSIS — G894 Chronic pain syndrome: Secondary | ICD-10-CM | POA: Diagnosis not present

## 2019-08-01 DIAGNOSIS — E039 Hypothyroidism, unspecified: Secondary | ICD-10-CM | POA: Diagnosis not present

## 2019-08-01 DIAGNOSIS — I10 Essential (primary) hypertension: Secondary | ICD-10-CM | POA: Diagnosis not present

## 2019-08-01 DIAGNOSIS — M069 Rheumatoid arthritis, unspecified: Secondary | ICD-10-CM | POA: Diagnosis not present

## 2019-08-01 DIAGNOSIS — E7800 Pure hypercholesterolemia, unspecified: Secondary | ICD-10-CM | POA: Diagnosis not present

## 2019-08-09 DIAGNOSIS — M0579 Rheumatoid arthritis with rheumatoid factor of multiple sites without organ or systems involvement: Secondary | ICD-10-CM | POA: Diagnosis not present

## 2019-09-06 DIAGNOSIS — M0579 Rheumatoid arthritis with rheumatoid factor of multiple sites without organ or systems involvement: Secondary | ICD-10-CM | POA: Diagnosis not present

## 2019-09-10 DIAGNOSIS — M810 Age-related osteoporosis without current pathological fracture: Secondary | ICD-10-CM | POA: Diagnosis not present

## 2019-10-04 DIAGNOSIS — M0579 Rheumatoid arthritis with rheumatoid factor of multiple sites without organ or systems involvement: Secondary | ICD-10-CM | POA: Diagnosis not present

## 2019-10-04 DIAGNOSIS — Z79899 Other long term (current) drug therapy: Secondary | ICD-10-CM | POA: Diagnosis not present

## 2019-10-17 DIAGNOSIS — M810 Age-related osteoporosis without current pathological fracture: Secondary | ICD-10-CM | POA: Diagnosis not present

## 2019-10-17 DIAGNOSIS — M545 Low back pain: Secondary | ICD-10-CM | POA: Diagnosis not present

## 2019-10-17 DIAGNOSIS — G894 Chronic pain syndrome: Secondary | ICD-10-CM | POA: Diagnosis not present

## 2019-10-17 DIAGNOSIS — M150 Primary generalized (osteo)arthritis: Secondary | ICD-10-CM | POA: Diagnosis not present

## 2019-10-17 DIAGNOSIS — M0579 Rheumatoid arthritis with rheumatoid factor of multiple sites without organ or systems involvement: Secondary | ICD-10-CM | POA: Diagnosis not present

## 2019-10-17 DIAGNOSIS — Z6824 Body mass index (BMI) 24.0-24.9, adult: Secondary | ICD-10-CM | POA: Diagnosis not present

## 2019-10-17 DIAGNOSIS — Z79899 Other long term (current) drug therapy: Secondary | ICD-10-CM | POA: Diagnosis not present

## 2019-12-06 DIAGNOSIS — M0579 Rheumatoid arthritis with rheumatoid factor of multiple sites without organ or systems involvement: Secondary | ICD-10-CM | POA: Diagnosis not present

## 2020-01-03 DIAGNOSIS — M0579 Rheumatoid arthritis with rheumatoid factor of multiple sites without organ or systems involvement: Secondary | ICD-10-CM | POA: Diagnosis not present

## 2020-01-31 DIAGNOSIS — R011 Cardiac murmur, unspecified: Secondary | ICD-10-CM | POA: Diagnosis not present

## 2020-01-31 DIAGNOSIS — Z794 Long term (current) use of insulin: Secondary | ICD-10-CM | POA: Diagnosis not present

## 2020-01-31 DIAGNOSIS — E039 Hypothyroidism, unspecified: Secondary | ICD-10-CM | POA: Diagnosis not present

## 2020-01-31 DIAGNOSIS — M069 Rheumatoid arthritis, unspecified: Secondary | ICD-10-CM | POA: Diagnosis not present

## 2020-01-31 DIAGNOSIS — I10 Essential (primary) hypertension: Secondary | ICD-10-CM | POA: Diagnosis not present

## 2020-01-31 DIAGNOSIS — G894 Chronic pain syndrome: Secondary | ICD-10-CM | POA: Diagnosis not present

## 2020-01-31 DIAGNOSIS — Z23 Encounter for immunization: Secondary | ICD-10-CM | POA: Diagnosis not present

## 2020-01-31 DIAGNOSIS — F324 Major depressive disorder, single episode, in partial remission: Secondary | ICD-10-CM | POA: Diagnosis not present

## 2020-01-31 DIAGNOSIS — E7800 Pure hypercholesterolemia, unspecified: Secondary | ICD-10-CM | POA: Diagnosis not present

## 2020-01-31 DIAGNOSIS — E119 Type 2 diabetes mellitus without complications: Secondary | ICD-10-CM | POA: Diagnosis not present

## 2020-01-31 DIAGNOSIS — Z853 Personal history of malignant neoplasm of breast: Secondary | ICD-10-CM | POA: Diagnosis not present

## 2020-02-14 DIAGNOSIS — Z79899 Other long term (current) drug therapy: Secondary | ICD-10-CM | POA: Diagnosis not present

## 2020-02-14 DIAGNOSIS — M0579 Rheumatoid arthritis with rheumatoid factor of multiple sites without organ or systems involvement: Secondary | ICD-10-CM | POA: Diagnosis not present

## 2020-03-13 DIAGNOSIS — M0579 Rheumatoid arthritis with rheumatoid factor of multiple sites without organ or systems involvement: Secondary | ICD-10-CM | POA: Diagnosis not present

## 2020-03-16 DIAGNOSIS — M810 Age-related osteoporosis without current pathological fracture: Secondary | ICD-10-CM | POA: Diagnosis not present

## 2020-04-10 DIAGNOSIS — M0579 Rheumatoid arthritis with rheumatoid factor of multiple sites without organ or systems involvement: Secondary | ICD-10-CM | POA: Diagnosis not present

## 2020-04-16 DIAGNOSIS — M810 Age-related osteoporosis without current pathological fracture: Secondary | ICD-10-CM | POA: Diagnosis not present

## 2020-04-16 DIAGNOSIS — Z6822 Body mass index (BMI) 22.0-22.9, adult: Secondary | ICD-10-CM | POA: Diagnosis not present

## 2020-04-16 DIAGNOSIS — R21 Rash and other nonspecific skin eruption: Secondary | ICD-10-CM | POA: Diagnosis not present

## 2020-04-16 DIAGNOSIS — Z79899 Other long term (current) drug therapy: Secondary | ICD-10-CM | POA: Diagnosis not present

## 2020-04-16 DIAGNOSIS — M2550 Pain in unspecified joint: Secondary | ICD-10-CM | POA: Diagnosis not present

## 2020-04-16 DIAGNOSIS — M0579 Rheumatoid arthritis with rheumatoid factor of multiple sites without organ or systems involvement: Secondary | ICD-10-CM | POA: Diagnosis not present

## 2020-04-16 DIAGNOSIS — M150 Primary generalized (osteo)arthritis: Secondary | ICD-10-CM | POA: Diagnosis not present

## 2020-04-16 DIAGNOSIS — G894 Chronic pain syndrome: Secondary | ICD-10-CM | POA: Diagnosis not present

## 2020-05-05 DIAGNOSIS — E039 Hypothyroidism, unspecified: Secondary | ICD-10-CM | POA: Diagnosis not present

## 2020-05-05 DIAGNOSIS — G894 Chronic pain syndrome: Secondary | ICD-10-CM | POA: Diagnosis not present

## 2020-05-05 DIAGNOSIS — E119 Type 2 diabetes mellitus without complications: Secondary | ICD-10-CM | POA: Diagnosis not present

## 2020-05-05 DIAGNOSIS — R634 Abnormal weight loss: Secondary | ICD-10-CM | POA: Diagnosis not present

## 2020-05-05 DIAGNOSIS — I10 Essential (primary) hypertension: Secondary | ICD-10-CM | POA: Diagnosis not present

## 2020-05-05 DIAGNOSIS — E7800 Pure hypercholesterolemia, unspecified: Secondary | ICD-10-CM | POA: Diagnosis not present

## 2020-05-05 DIAGNOSIS — K219 Gastro-esophageal reflux disease without esophagitis: Secondary | ICD-10-CM | POA: Diagnosis not present

## 2020-05-22 DIAGNOSIS — M0579 Rheumatoid arthritis with rheumatoid factor of multiple sites without organ or systems involvement: Secondary | ICD-10-CM | POA: Diagnosis not present

## 2020-06-19 DIAGNOSIS — M0579 Rheumatoid arthritis with rheumatoid factor of multiple sites without organ or systems involvement: Secondary | ICD-10-CM | POA: Diagnosis not present

## 2020-07-23 ENCOUNTER — Other Ambulatory Visit: Payer: Self-pay

## 2020-07-23 ENCOUNTER — Ambulatory Visit (INDEPENDENT_AMBULATORY_CARE_PROVIDER_SITE_OTHER): Payer: Medicare Other | Admitting: Nurse Practitioner

## 2020-07-23 ENCOUNTER — Encounter: Payer: Self-pay | Admitting: Nurse Practitioner

## 2020-07-23 VITALS — BP 142/70 | HR 95 | Ht 63.0 in | Wt 121.8 lb

## 2020-07-23 DIAGNOSIS — R112 Nausea with vomiting, unspecified: Secondary | ICD-10-CM

## 2020-07-23 DIAGNOSIS — R1013 Epigastric pain: Secondary | ICD-10-CM | POA: Diagnosis not present

## 2020-07-23 DIAGNOSIS — R634 Abnormal weight loss: Secondary | ICD-10-CM

## 2020-07-23 NOTE — Progress Notes (Signed)
ASSESSMENT AND PLAN    # Unexplained 25 pound weight loss over the last year  She has had nausea / vomiting for several months. She has chronic epigastric burning which has been much worse lately --Patient will be scheduled for EGD. The risks and benefits of EGD were discussed and the patient agrees to proceed.  --If negative then consider CT scan to evaluate for malignancy  # Colon cancer screening. Patient says she has been doing interval home stool tests for cancer, the last one 6 months ago was apparently negative.    We were at least able to determine through [employer] that she did not have a cologuard.  --Will go ahead and schedule her for a screening colonoscopy to be done at time of EGD.  The risks and benefits of colonoscopy with possible polypectomy / biopsies were discussed and the patient agrees to proceed.   # Hx of PUD, history of gastric resection in 1990's. Followed by Dr. Fuller Plan for PUD in 1999  # Occasional fecal leakage. PCP treating her for hemorrhoids.  --We discussed using a glycerin suppository after each BM to empty rectum of any remaining contents  # Multiple drug allergies.   #Rheumatoid arthritis, on Plaquenil and Orencia   HISTORY OF PRESENT ILLNESS     Chief Complaint : abdominal pain , chronic nausea / vomiting, abdominal pain, weight loss  [name] is a 70 y.o. female known remotely to Dr. Fuller Plan with a past medical history significant for rheumatoid arthritis, chronic pain, DM2, depression, PUD, remote breast cancer , hypothyroidism and hyperlipidemia  Patient is referred by PCP for evaluation of weight loss, nausea, vomiting, abdominal pain, history of PUD. She says PCP recently performed several labs tests but I do not have any of those records.  Patient was able to pull up some of the results on her phone .   Oct 2021 through Jan 2022  Hgb A1c 6, ESR 13, TSH normal  [name] started started losing weight a year ago.  In April 2021 she  was 145 pounds.  In January of this year she was 132 pounds and today she is 121.  but it really became noticeable in January 2022. She talked to PCP a few times about the weight loss and labs were ordered but apparently unremarkable. Patent says she had extreme depression October 2021 - December 2021. She was on Cymbalta at the time and Wellbutrin was added. Cymbalta recently became cost prohibitive and she was changed to escitopram 10 mg in February.  [name] doesn't know why she is losing weight.   Her appetite is good, eating usual amounts. She does have chronic nausea / vomiting but mainly occurs just when exerting herself.  Zofran helps. She has chronic, intermittent epigastric burning which has much worse lately. She eats a lot of spicy food. She has been taking a PPI since 1999 ( Dr. Fuller Plan).   Patient complains of diffuse lower abdominal cramping prior to defecation. She isn't constipated, has had loose BMs all her life. Pain relieved with defecation.   She says that she gets interval stool testing for cancer ,  last one 6 months ago was normal. She hasn't had any blood in her stool. No Surprise of colon cancer.    Past Medical History:  Diagnosis Date  . Asthma, mild    Intermittent  . Chronic back pain   . Echocardiogram abnormal 09/2008   With mild aortic valve and mitral valve regurgitation  . Heart  murmur   . History of left breast cancer 1999  . Hyperlipidemia   . Hypertension   . Hypothyroidism   . IBS (irritable bowel syndrome)   . Lactose intolerance   . Osteoporosis   . Peptic ulcer disease   . Type II diabetes mellitus ([place])    [facility] 08-2012  . Ulcer, duodenal, acute, with obstruction 1999     Past Surgical History:  Procedure Laterality Date  . ABDOMINAL HYSTERECTOMY    . BREAST IMPLANT REMOVAL     Breast implant infected removed implant 06/06 infected and removed 11/05  . BREAST LUMPECTOMY  09/99   Left breast  . BREAST RECONSTRUCTION    . CHOLECYSTECTOMY    .  CYSTOSCOPY W/ URETERAL STENT PLACEMENT Left 01/19/2018   Procedure: CYSTOSCOPY WITH RETROGRADE PYELOGRAM/URETERAL STENT PLACEMENT;  Surgeon: Franchot Gallo, MD;  Location: [facility];  Service: Urology;  Laterality: Left;  . EXTRACORPOREAL SHOCK WAVE LITHOTRIPSY Left 02/15/2018   Procedure: LEFT EXTRACORPOREAL SHOCK WAVE LITHOTRIPSY (ESWL);  Surgeon: Cleon Gustin, MD;  Location: [facility];  Service: Urology;  Laterality: Left;  Marland Kitchen MASTECTOMY  03/05   Left  . other     Left Arm FX  . OTHER SURGICAL HISTORY     Hysterectomy- Ovaries intact   . OTHER SURGICAL HISTORY     Fracture Left HIp  . PARTIAL GASTRECTOMY  1999   Family History  Problem Relation Age of Onset  . Hypertension Father   . Diabetes Father   . Diabetes Sister   . Thyroid cancer Sister   . Diabetes Brother   . Colon cancer Neg Hx   . Esophageal cancer Neg Hx   . Stomach cancer Neg Hx    Social History   Tobacco Use  . Smoking status: Former Smoker    Quit date: 05/12/2000    Years since quitting: 20.2  . Smokeless tobacco: Never Used  Vaping Use  . Vaping Use: Never used  Substance Use Topics  . Alcohol use: No  . Drug use: No   Current Outpatient Medications  Medication Sig Dispense Refill  . abatacept (ORENCIA) 250 MG injection Inject into the vein every 30 (thirty) days.    Marland Kitchen amLODipine (NORVASC) 10 MG tablet Take 10 mg by mouth at bedtime.     Marland Kitchen buPROPion (WELLBUTRIN XL) 150 MG 24 hr tablet Take 150 mg by mouth every morning.    . cloNIDine (CATAPRES) 0.3 MG tablet Take 0.3 mg by mouth 2 (two) times daily.    Marland Kitchen denosumab (PROLIA) 60 MG/ML SOSY injection Inject 60 mg into the skin every 6 (six) months.    . esomeprazole (NEXIUM) 20 MG capsule Take 40 mg by mouth daily.     . folic acid (FOLVITE) 1 MG tablet Take 1 mg by mouth daily.    Marland Kitchen HYDROcodone-acetaminophen (NORCO) 10-325 MG tablet Take 1 tablet by mouth every 6 (six) hours as needed (For pain.).    Marland Kitchen hydrocortisone (ANUSOL-HC) 25 MG suppository  Place 25 mg rectally 2 (two) times daily as needed for hemorrhoids.     . hydroxychloroquine (PLAQUENIL) 200 MG tablet Take 400 mg by mouth daily.    Marland Kitchen levothyroxine (SYNTHROID, LEVOTHROID) 200 MCG tablet Take 200 mcg by mouth daily.    . metFORMIN (GLUCOPHAGE-XR) 500 MG 24 hr tablet Take 2,000 mg by mouth daily.    Marland Kitchen morphine (MS CONTIN) 30 MG 12 hr tablet Take 30 mg by mouth every 12 (twelve) hours.    . Multiple  Vitamin (MULTIVITAMIN WITH MINERALS) TABS tablet Take 1 tablet by mouth daily.    . ondansetron (ZOFRAN-ODT) 8 MG disintegrating tablet Take 8 mg by mouth every 8 (eight) hours as needed for nausea.    . pravastatin (PRAVACHOL) 40 MG tablet Take 40 mg by mouth daily.     No current facility-administered medications for this visit.   Allergies  Allergen Reactions  . Fish Allergy Anaphylaxis and Hives  . Amaryl [Glimepiride] Itching  . Avalide [Irbesartan-Hydrochlorothiazide] Itching  . Avandia [Rosiglitazone] Itching  . Byetta 10 Mcg Pen [Exenatide] Itching  . Codeine Itching  . Contrast Media [Iodinated Diagnostic Agents] Itching  . Demerol [Meperidine Hcl] Itching  . Glucovance [Glyburide-Metformin] Itching  . Lisinopril Itching  . Naprosyn [Naproxen] Hives  . Penicillins Itching and Other (See Comments)    Has patient had a PCN reaction causing immediate rash, facial/tongue/throat swelling, SOB or lightheadedness with hypotension: no Has patient had a PCN reaction causing severe rash involving mucus membranes or skin necrosis: no Has patient had a PCN reaction that required hospitalization: yes Has patient had a PCN reaction occurring within the last 10 years: no If all of the above answers are "NO", then may proceed with Cephalosporin use.     Review of Systems: All systems reviewed and negative except where noted in HPI.   PHYSICAL EXAM :    Wt Readings from Last 3 Encounters:  07/23/20 121 lb 12.8 oz (55.2 kg)  02/15/18 145 lb (65.8 kg)  01/21/18 157 lb 10.1  oz (71.5 kg)    BP (!) 142/70   Pulse 95   Ht 5' 3"  (1.6 m)   Wt 121 lb 12.8 oz (55.2 kg)   SpO2 97%   BMI 21.58 kg/m  Constitutional:  Pleasant thin female in no acute distress. Psychiatric: Normal mood and affect. Behavior is normal. EENT: Pupils normal.  Conjunctivae are normal. No scleral icterus. Neck supple.  Cardiovascular: Normal rate, regular rhythm. Murmur present. No edema Pulmonary/chest: Effort normal and breath sounds normal. No wheezing, rales or rhonchi. Abdominal: Soft, nondistended, nontender. Bowel sounds active throughout. There are no masses palpable. No hepatomegaly. Neurological: Alert and oriented to person place and time. Skin: Skin is warm and dry. No rashes noted.  Tye Savoy, NP  07/23/2020, 10:39 AM  Cc:  Referring Provider Shirline Frees, MD

## 2020-07-23 NOTE — Patient Instructions (Signed)
If you are age 71 or older, your body mass index should be between 23-30. Your Body mass index is 21.58 kg/m. If this is out of the aforementioned range listed, please consider follow up with your Primary Care Provider.  If you are age 68 or younger, your body mass index should be between 19-25. Your Body mass index is 21.58 kg/m. If this is out of the aformentioned range listed, please consider follow up with your Primary Care Provider.   You have been scheduled for an endoscopy and colonoscopy. Please follow the written instructions given to you at your visit today. Please pick up your prep supplies at the pharmacy within the next 1-3 days. If you use inhalers (even only as needed), please bring them with you on the day of your procedure.  Use Glycerine Suppositories as needed. You can buy these over the counter.  Follow up pending the results of your Endoscopy/Colonoscopy.  Thank you for entrusting me with your care and choosing [facility].  Tye Savoy, NP-C

## 2020-07-24 DIAGNOSIS — M0579 Rheumatoid arthritis with rheumatoid factor of multiple sites without organ or systems involvement: Secondary | ICD-10-CM | POA: Diagnosis not present

## 2020-07-24 NOTE — Progress Notes (Signed)
Reviewed and agree with management plan.  Malcolm T. Stark, MD FACG [phone]  

## 2020-07-28 ENCOUNTER — Encounter: Payer: Self-pay | Admitting: Certified Registered Nurse Anesthetist

## 2020-07-29 ENCOUNTER — Other Ambulatory Visit: Payer: Self-pay

## 2020-07-29 ENCOUNTER — Ambulatory Visit (AMBULATORY_SURGERY_CENTER): Payer: Medicare Other | Admitting: Gastroenterology

## 2020-07-29 ENCOUNTER — Encounter: Payer: Self-pay | Admitting: Gastroenterology

## 2020-07-29 VITALS — BP 132/60 | HR 80 | Temp 97.3°F | Resp 16 | Ht 63.0 in | Wt 121.0 lb

## 2020-07-29 DIAGNOSIS — R634 Abnormal weight loss: Secondary | ICD-10-CM | POA: Diagnosis not present

## 2020-07-29 DIAGNOSIS — K2970 Gastritis, unspecified, without bleeding: Secondary | ICD-10-CM | POA: Diagnosis not present

## 2020-07-29 DIAGNOSIS — R1013 Epigastric pain: Secondary | ICD-10-CM | POA: Diagnosis not present

## 2020-07-29 DIAGNOSIS — E119 Type 2 diabetes mellitus without complications: Secondary | ICD-10-CM | POA: Diagnosis not present

## 2020-07-29 DIAGNOSIS — K2950 Unspecified chronic gastritis without bleeding: Secondary | ICD-10-CM | POA: Diagnosis not present

## 2020-07-29 DIAGNOSIS — Z1211 Encounter for screening for malignant neoplasm of colon: Secondary | ICD-10-CM | POA: Diagnosis not present

## 2020-07-29 DIAGNOSIS — R112 Nausea with vomiting, unspecified: Secondary | ICD-10-CM

## 2020-07-29 DIAGNOSIS — I10 Essential (primary) hypertension: Secondary | ICD-10-CM | POA: Diagnosis not present

## 2020-07-29 MED ORDER — SUCRALFATE 1 GM/10ML PO SUSP: 1.0000 g | Freq: Four times a day (QID) | ORAL | 8 refills | Status: DC

## 2020-07-29 MED ORDER — ONDANSETRON HCL 4 MG PO TABS: 8.0000 mg | ORAL_TABLET | Freq: Three times a day (TID) | ORAL | 6 refills | Status: DC

## 2020-07-29 MED ORDER — SODIUM CHLORIDE 0.9 % IV SOLN: 500.0000 mL | Freq: Once | INTRAVENOUS | Status: DC

## 2020-07-29 NOTE — Op Note (Signed)
[facility] Patient Name: [name] Procedure Date: 07/29/2020 7:51 AM MRN: [id number] Endoscopist: Ladene Artist , MD Age: 70 Referring MD:  Date of Birth: [birth year] Gender: Female Account #: [id number] Procedure:                Colonoscopy Indications:              Screening for colorectal malignant neoplasm Medicines:                Monitored Anesthesia Care Procedure:                Pre-Anesthesia Assessment:                           - Prior to the procedure, a History and Physical                            was performed, and patient medications and                            allergies were reviewed. The patient's tolerance of                            previous anesthesia was also reviewed. The risks                            and benefits of the procedure and the sedation                            options and risks were discussed with the patient.                            All questions were answered, and informed consent                            was obtained. Prior Anticoagulants: The patient has                            taken no previous anticoagulant or antiplatelet                            agents. ASA Grade Assessment: II - A patient with                            mild systemic disease. After reviewing the risks                            and benefits, the patient was deemed in                            satisfactory condition to undergo the procedure.                           After obtaining informed consent, the colonoscope  was passed under direct vision. Throughout the                            procedure, the patient's blood pressure, pulse, and                            oxygen saturations were monitored continuously. The                            Olympus PFC-[id number] (#[id number]) Colonoscope was                            introduced through the anus and advanced to the the                            cecum, identified  by appendiceal orifice and                            ileocecal valve. The ileocecal valve, appendiceal                            orifice, and rectum were photographed. The quality                            of the bowel preparation was inadequate despite                            extensive lavage and suction. The colonoscopy was                            performed without difficulty. The patient tolerated                            the procedure well. Scope In: 8:10:24 AM Scope Out: 8:26:15 AM Scope Withdrawal Time: 0 hours 11 minutes 48 seconds  Total Procedure Duration: 0 hours 15 minutes 51 seconds  Findings:                 The perianal and digital rectal examinations were                            normal.                           Multiple medium-mouthed diverticula were found in                            the left colon. There was no evidence of                            diverticular bleeding.                           Internal hemorrhoids were found during  retroflexion. The hemorrhoids were small and Grade                            I (internal hemorrhoids that do not prolapse).                           The exam was otherwise without abnormality on                            direct and retroflexion views. Complications:            No immediate complications. Estimated blood loss:                            None. Estimated Blood Loss:     Estimated blood loss: none. Impression:               - Preparation of the colon was inadequate.                           - Moderate diverticulosis in the left colon.                           - Internal hemorrhoids.                           - The examination was otherwise normal on direct                            and retroflexion views.                           - No specimens collected. Recommendation:           - Repeat colonoscopy in 3 months because the bowel                            preparation was  suboptimal.                           - Patient has a contact number available for                            emergencies. The signs and symptoms of potential                            delayed complications were discussed with the                            patient. Return to normal activities tomorrow.                            Written discharge instructions were provided to the                            patient.                           -  Resume previous diet.                           - Continue present medications. Ladene Artist, MD 07/29/2020 8:29:52 AM This report has been signed electronically.

## 2020-07-29 NOTE — Progress Notes (Signed)
0745 Patient experiencing nausea and vomiting. Zofran 4 mg given IV.  Robinul 0.1 mg IV given due large amount of secretions upon assessment.  MD made aware, vss

## 2020-07-29 NOTE — Progress Notes (Signed)
CT scan and follow up with Nevin Bloodgood need to be ordered for this patient per Dr. Fuller Plan.

## 2020-07-29 NOTE — Progress Notes (Signed)
C.W. vital signs. 

## 2020-07-29 NOTE — Patient Instructions (Signed)
YOU HAD AN ENDOSCOPIC PROCEDURE TODAY AT THE [facility]:   Refer to the procedure report that was given to you for any specific questions about what was found during the examination.  If the procedure report does not answer your questions, please call your gastroenterologist to clarify.  If you requested that your care partner not be given the details of your procedure findings, then the procedure report has been included in a sealed envelope for you to review at your convenience later.  YOU SHOULD EXPECT: Some feelings of bloating in the abdomen. Passage of more gas than usual.  Walking can help get rid of the air that was put into your GI tract during the procedure and reduce the bloating. If you had a lower endoscopy (such as a colonoscopy or flexible sigmoidoscopy) you may notice spotting of blood in your stool or on the toilet paper. If you underwent a bowel prep for your procedure, you may not have a normal bowel movement for a few days.  Please Note:  You might notice some irritation and congestion in your nose or some drainage.  This is from the oxygen used during your procedure.  There is no need for concern and it should clear up in a day or so.  SYMPTOMS TO REPORT IMMEDIATELY:   Following lower endoscopy (colonoscopy or flexible sigmoidoscopy):  Excessive amounts of blood in the stool  Significant tenderness or worsening of abdominal pains  Swelling of the abdomen that is new, acute  Fever of 100F or higher   Following upper endoscopy (EGD)  Vomiting of blood or coffee ground material  New chest pain or pain under the shoulder blades  Painful or persistently difficult swallowing  New shortness of breath  Fever of 100F or higher  Black, tarry-looking stools  For urgent or emergent issues, a gastroenterologist can be reached at any hour by calling [phone]. Do not use MyChart messaging for urgent concerns.    DIET:  We do recommend a small meal at first, but  then you may proceed to your regular diet.  Drink plenty of fluids but you should avoid alcoholic beverages for 24 hours.  ACTIVITY:  You should plan to take it easy for the rest of today and you should NOT DRIVE or use heavy machinery until tomorrow (because of the sedation medicines used during the test).    FOLLOW UP: Our staff will call the number listed on your records 48-72 hours following your procedure to check on you and address any questions or concerns that you may have regarding the information given to you following your procedure. If we do not reach you, we will leave a message.  We will attempt to reach you two times.  During this call, we will ask if you have developed any symptoms of COVID 19. If you develop any symptoms (ie: fever, flu-like symptoms, shortness of breath, cough etc.) before then, please call [phone].  If you test positive for Covid 19 in the 2 weeks post procedure, please call and report this information to us.    If any biopsies were taken you will be contacted by phone or by letter within the next 1-3 weeks.  Please call us at [phone] if you have not heard about the biopsies in 3 weeks.    SIGNATURES/CONFIDENTIALITY: You and/or your care partner have signed paperwork which will be entered into your electronic medical record.  These signatures attest to the fact that that the information above on   your After Visit Summary has been reviewed and is understood.  Full responsibility of the confidentiality of this discharge information lies with you and/or your care-partner. 

## 2020-07-29 NOTE — Progress Notes (Signed)
Report given to PACU, vss 

## 2020-07-29 NOTE — Progress Notes (Signed)
Pt's states no medical or surgical changes since previsit or office visit. 

## 2020-07-29 NOTE — Op Note (Signed)
[place] Patient Name: [name] Procedure Date: 07/29/2020 7:50 AM MRN: [id number] Endoscopist: Ladene Artist , MD Age: 70 Referring MD:  Date of Birth: April 21, 1951 Gender: Female Account #: [id number] Procedure:                Upper GI endoscopy Indications:              Epigastric abdominal pain, Nausea with vomiting,                            Weight loss Medicines:                Monitored Anesthesia Care Procedure:                Pre-Anesthesia Assessment:                           - Prior to the procedure, a History and Physical                            was performed, and patient medications and                            allergies were reviewed. The patient's tolerance of                            previous anesthesia was also reviewed. The risks                            and benefits of the procedure and the sedation                            options and risks were discussed with the patient.                            All questions were answered, and informed consent                            was obtained. Prior Anticoagulants: The patient has                            taken no previous anticoagulant or antiplatelet                            agents. ASA Grade Assessment: II - A patient with                            mild systemic disease. After reviewing the risks                            and benefits, the patient was deemed in                            satisfactory condition to undergo the procedure.  After obtaining informed consent, the endoscope was                            passed under direct vision. Throughout the                            procedure, the patient's blood pressure, pulse, and                            oxygen saturations were monitored continuously. The                            Endoscope was introduced through the mouth, and                            advanced to the efferent jejunal loop. The  upper GI                            endoscopy was accomplished without difficulty. The                            patient tolerated the procedure well. Scope In: Scope Out: Findings:                 The examined esophagus was normal.                           Evidence of a patent apparent Billroth I                            gastroduodenostomy was found. A gastric pouch with                            a normal size was found. Two staples noted at                            anastomosis. The gastroduodenal anastomosis was                            characterized by healthy appearing mucosa. This was                            traversed.                           Diffuse mild inflammation characterized by erythema                            and granularity was found in the gastric body.                            Biopsies were taken with a cold forceps for                            histology.  The examined jejunum was normal. Complications:            No immediate complications. Estimated Blood Loss:     Estimated blood loss was minimal. Impression:               - Normal esophagus.                           - Patent apparent Billroth I gastroduodenostomy was                            found, characterized by healthy appearing mucosa.                           - Gastritis. Biopsied.                           - Normal examined jejunum. Recommendation:           - Patient has a contact number available for                            emergencies. The signs and symptoms of potential                            delayed complications were discussed with the                            patient. Return to normal activities tomorrow.                            Written discharge instructions were provided to the                            patient.                           - Soft diet.                           - Continue present medications and change Zofran to                             8 mg tid, taken 30 minutes ac.                           - Carafate suspension 1g po tid, 3 month supply.                           - Await pathology results.                           - Perform a CT scan (computed tomography) of                            abdomen with contrast and pelvis with contrast at  the next available appointment.                           - GI office appt with me or Tye Savoy, NP in                            2-3 weeks after CT is completed. Ladene Artist, MD 07/29/2020 8:45:18 AM This report has been signed electronically.

## 2020-07-29 NOTE — Progress Notes (Signed)
Called to room to assist during endoscopic procedure.  Patient ID and intended procedure confirmed with present staff. Received instructions for my participation in the procedure from the performing physician.  

## 2020-07-29 NOTE — Progress Notes (Signed)
0757 BP 169/77, Labetalol given IV, MD update, vss

## 2020-07-31 ENCOUNTER — Telehealth: Payer: Self-pay

## 2020-07-31 ENCOUNTER — Other Ambulatory Visit: Payer: Self-pay

## 2020-07-31 ENCOUNTER — Telehealth: Payer: Self-pay | Admitting: *Deleted

## 2020-07-31 DIAGNOSIS — R112 Nausea with vomiting, unspecified: Secondary | ICD-10-CM

## 2020-07-31 DIAGNOSIS — R1013 Epigastric pain: Secondary | ICD-10-CM

## 2020-07-31 DIAGNOSIS — R634 Abnormal weight loss: Secondary | ICD-10-CM

## 2020-07-31 NOTE — Telephone Encounter (Signed)
Patient notified that she will be contacted directly by [facility] scheduling with an appointment date and time for CT.

## 2020-07-31 NOTE — Telephone Encounter (Signed)
Patient with a contrast allergy.  She is to be scheduled for CT abd/pelvis .  Dr. Fuller Plan please advise if you want prednisone protocol or alternate imaging study.

## 2020-07-31 NOTE — Telephone Encounter (Signed)
  Follow up Call-  Call back number 07/29/2020  Post procedure Call Back phone  # [phone]  Permission to leave phone message No  Some recent data might be hidden     Patient questions:  Do you have a fever, pain , or abdominal swelling? No. Pain Score  0 *  Have you tolerated food without any problems? Yes.    Have you been able to return to your normal activities? Yes.    Do you have any questions about your discharge instructions: Diet   No. Medications  No. Follow up visit  No.  Do you have questions or concerns about your Care? No.  Actions: * If pain score is 4 or above: 1. No action needed, pain <4.Have you developed a fever since your procedure? no  2.   Have you had an respiratory symptoms (SOB or cough) since your procedure? no  3.   Have you tested positive for COVID 19 since your procedure no  4.   Have you had any family members/close contacts diagnosed with the COVID 19 since your procedure?  no   If yes to any of these questions please route to Joylene John, RN and Joella Prince, RN

## 2020-07-31 NOTE — Addendum Note (Signed)
Addended by: Marlon Pel on: 07/31/2020 04:27 PM   Modules accepted: Orders

## 2020-07-31 NOTE — Telephone Encounter (Signed)
Change CT AP without IV contrast

## 2020-08-05 ENCOUNTER — Encounter: Payer: Self-pay | Admitting: Gastroenterology

## 2020-08-18 ENCOUNTER — Ambulatory Visit (HOSPITAL_COMMUNITY)
Admission: RE | Admit: 2020-08-18 | Discharge: 2020-08-18 | Disposition: A | Discharge status: Home / Self Care | Payer: Medicare Other | Source: Ambulatory Visit | Attending: Gastroenterology | Admitting: Gastroenterology

## 2020-08-18 ENCOUNTER — Other Ambulatory Visit: Payer: Self-pay

## 2020-08-18 DIAGNOSIS — R112 Nausea with vomiting, unspecified: Secondary | ICD-10-CM | POA: Diagnosis not present

## 2020-08-18 DIAGNOSIS — Z853 Personal history of malignant neoplasm of breast: Secondary | ICD-10-CM | POA: Diagnosis not present

## 2020-08-18 DIAGNOSIS — R1013 Epigastric pain: Secondary | ICD-10-CM | POA: Diagnosis not present

## 2020-08-18 DIAGNOSIS — R634 Abnormal weight loss: Secondary | ICD-10-CM | POA: Insufficient documentation

## 2020-08-18 DIAGNOSIS — N200 Calculus of kidney: Secondary | ICD-10-CM | POA: Diagnosis not present

## 2020-08-18 DIAGNOSIS — K6389 Other specified diseases of intestine: Secondary | ICD-10-CM | POA: Diagnosis not present

## 2020-08-21 DIAGNOSIS — M0579 Rheumatoid arthritis with rheumatoid factor of multiple sites without organ or systems involvement: Secondary | ICD-10-CM | POA: Diagnosis not present

## 2020-08-24 ENCOUNTER — Other Ambulatory Visit: Payer: Self-pay

## 2020-08-24 DIAGNOSIS — K56600 Partial intestinal obstruction, unspecified as to cause: Secondary | ICD-10-CM

## 2020-09-01 DIAGNOSIS — I10 Essential (primary) hypertension: Secondary | ICD-10-CM | POA: Diagnosis not present

## 2020-09-01 DIAGNOSIS — E119 Type 2 diabetes mellitus without complications: Secondary | ICD-10-CM | POA: Diagnosis not present

## 2020-09-01 DIAGNOSIS — F324 Major depressive disorder, single episode, in partial remission: Secondary | ICD-10-CM | POA: Diagnosis not present

## 2020-09-01 DIAGNOSIS — G894 Chronic pain syndrome: Secondary | ICD-10-CM | POA: Diagnosis not present

## 2020-09-01 DIAGNOSIS — E039 Hypothyroidism, unspecified: Secondary | ICD-10-CM | POA: Diagnosis not present

## 2020-09-01 DIAGNOSIS — Z853 Personal history of malignant neoplasm of breast: Secondary | ICD-10-CM | POA: Diagnosis not present

## 2020-09-01 DIAGNOSIS — R011 Cardiac murmur, unspecified: Secondary | ICD-10-CM | POA: Diagnosis not present

## 2020-09-01 DIAGNOSIS — R110 Nausea: Secondary | ICD-10-CM | POA: Diagnosis not present

## 2020-09-01 DIAGNOSIS — E7800 Pure hypercholesterolemia, unspecified: Secondary | ICD-10-CM | POA: Diagnosis not present

## 2020-09-01 DIAGNOSIS — M069 Rheumatoid arthritis, unspecified: Secondary | ICD-10-CM | POA: Diagnosis not present

## 2020-09-04 ENCOUNTER — Other Ambulatory Visit: Payer: Self-pay

## 2020-09-04 ENCOUNTER — Ambulatory Visit (HOSPITAL_COMMUNITY)
Admission: RE | Admit: 2020-09-04 | Discharge: 2020-09-04 | Disposition: A | Discharge status: Home / Self Care | Payer: Medicare Other | Source: Ambulatory Visit | Attending: Gastroenterology | Admitting: Gastroenterology

## 2020-09-04 ENCOUNTER — Encounter (HOSPITAL_COMMUNITY): Payer: Self-pay

## 2020-09-04 DIAGNOSIS — K56600 Partial intestinal obstruction, unspecified as to cause: Secondary | ICD-10-CM

## 2020-09-14 DIAGNOSIS — M810 Age-related osteoporosis without current pathological fracture: Secondary | ICD-10-CM | POA: Diagnosis not present

## 2020-09-16 ENCOUNTER — Ambulatory Visit (HOSPITAL_COMMUNITY)
Admission: RE | Admit: 2020-09-16 | Discharge: 2020-09-16 | Disposition: A | Discharge status: Home / Self Care | Payer: Medicare Other | Source: Ambulatory Visit | Attending: Gastroenterology | Admitting: Gastroenterology

## 2020-09-16 ENCOUNTER — Other Ambulatory Visit: Payer: Self-pay

## 2020-09-16 DIAGNOSIS — K56609 Unspecified intestinal obstruction, unspecified as to partial versus complete obstruction: Secondary | ICD-10-CM | POA: Diagnosis not present

## 2020-09-16 DIAGNOSIS — K56600 Partial intestinal obstruction, unspecified as to cause: Secondary | ICD-10-CM | POA: Diagnosis not present

## 2020-09-21 DIAGNOSIS — M0579 Rheumatoid arthritis with rheumatoid factor of multiple sites without organ or systems involvement: Secondary | ICD-10-CM | POA: Diagnosis not present

## 2020-10-15 DIAGNOSIS — G894 Chronic pain syndrome: Secondary | ICD-10-CM | POA: Diagnosis not present

## 2020-10-15 DIAGNOSIS — M150 Primary generalized (osteo)arthritis: Secondary | ICD-10-CM | POA: Diagnosis not present

## 2020-10-15 DIAGNOSIS — Z6820 Body mass index (BMI) 20.0-20.9, adult: Secondary | ICD-10-CM | POA: Diagnosis not present

## 2020-10-15 DIAGNOSIS — M810 Age-related osteoporosis without current pathological fracture: Secondary | ICD-10-CM | POA: Diagnosis not present

## 2020-10-15 DIAGNOSIS — M0579 Rheumatoid arthritis with rheumatoid factor of multiple sites without organ or systems involvement: Secondary | ICD-10-CM | POA: Diagnosis not present

## 2020-10-15 DIAGNOSIS — M25511 Pain in right shoulder: Secondary | ICD-10-CM | POA: Diagnosis not present

## 2020-10-15 DIAGNOSIS — Z79899 Other long term (current) drug therapy: Secondary | ICD-10-CM | POA: Diagnosis not present

## 2020-10-19 DIAGNOSIS — M0579 Rheumatoid arthritis with rheumatoid factor of multiple sites without organ or systems involvement: Secondary | ICD-10-CM | POA: Diagnosis not present

## 2020-11-03 DIAGNOSIS — F1190 Opioid use, unspecified, uncomplicated: Secondary | ICD-10-CM | POA: Insufficient documentation

## 2020-11-16 DIAGNOSIS — R5383 Other fatigue: Secondary | ICD-10-CM | POA: Diagnosis not present

## 2020-11-16 DIAGNOSIS — M0579 Rheumatoid arthritis with rheumatoid factor of multiple sites without organ or systems involvement: Secondary | ICD-10-CM | POA: Diagnosis not present

## 2020-11-16 DIAGNOSIS — Z79899 Other long term (current) drug therapy: Secondary | ICD-10-CM | POA: Diagnosis not present

## 2020-12-10 DIAGNOSIS — M069 Rheumatoid arthritis, unspecified: Secondary | ICD-10-CM | POA: Diagnosis not present

## 2020-12-10 DIAGNOSIS — E119 Type 2 diabetes mellitus without complications: Secondary | ICD-10-CM | POA: Diagnosis not present

## 2020-12-10 DIAGNOSIS — I10 Essential (primary) hypertension: Secondary | ICD-10-CM | POA: Diagnosis not present

## 2020-12-10 DIAGNOSIS — E039 Hypothyroidism, unspecified: Secondary | ICD-10-CM | POA: Diagnosis not present

## 2020-12-10 DIAGNOSIS — F324 Major depressive disorder, single episode, in partial remission: Secondary | ICD-10-CM | POA: Diagnosis not present

## 2020-12-10 DIAGNOSIS — R011 Cardiac murmur, unspecified: Secondary | ICD-10-CM | POA: Diagnosis not present

## 2020-12-10 DIAGNOSIS — E7800 Pure hypercholesterolemia, unspecified: Secondary | ICD-10-CM | POA: Diagnosis not present

## 2020-12-10 DIAGNOSIS — G894 Chronic pain syndrome: Secondary | ICD-10-CM | POA: Diagnosis not present

## 2020-12-10 DIAGNOSIS — Z853 Personal history of malignant neoplasm of breast: Secondary | ICD-10-CM | POA: Diagnosis not present

## 2020-12-15 DIAGNOSIS — M0579 Rheumatoid arthritis with rheumatoid factor of multiple sites without organ or systems involvement: Secondary | ICD-10-CM | POA: Diagnosis not present

## 2021-01-12 DIAGNOSIS — M0579 Rheumatoid arthritis with rheumatoid factor of multiple sites without organ or systems involvement: Secondary | ICD-10-CM | POA: Diagnosis not present

## 2021-01-20 DIAGNOSIS — Z23 Encounter for immunization: Secondary | ICD-10-CM | POA: Diagnosis not present

## 2021-02-09 DIAGNOSIS — M0579 Rheumatoid arthritis with rheumatoid factor of multiple sites without organ or systems involvement: Secondary | ICD-10-CM | POA: Diagnosis not present

## 2021-03-09 DIAGNOSIS — M0579 Rheumatoid arthritis with rheumatoid factor of multiple sites without organ or systems involvement: Secondary | ICD-10-CM | POA: Diagnosis not present

## 2021-03-12 DIAGNOSIS — G894 Chronic pain syndrome: Secondary | ICD-10-CM | POA: Diagnosis not present

## 2021-03-12 DIAGNOSIS — E119 Type 2 diabetes mellitus without complications: Secondary | ICD-10-CM | POA: Diagnosis not present

## 2021-03-12 DIAGNOSIS — Z853 Personal history of malignant neoplasm of breast: Secondary | ICD-10-CM | POA: Diagnosis not present

## 2021-03-12 DIAGNOSIS — R011 Cardiac murmur, unspecified: Secondary | ICD-10-CM | POA: Diagnosis not present

## 2021-03-12 DIAGNOSIS — E039 Hypothyroidism, unspecified: Secondary | ICD-10-CM | POA: Diagnosis not present

## 2021-03-12 DIAGNOSIS — F324 Major depressive disorder, single episode, in partial remission: Secondary | ICD-10-CM | POA: Diagnosis not present

## 2021-03-12 DIAGNOSIS — M069 Rheumatoid arthritis, unspecified: Secondary | ICD-10-CM | POA: Diagnosis not present

## 2021-03-12 DIAGNOSIS — I10 Essential (primary) hypertension: Secondary | ICD-10-CM | POA: Diagnosis not present

## 2021-03-12 DIAGNOSIS — E7800 Pure hypercholesterolemia, unspecified: Secondary | ICD-10-CM | POA: Diagnosis not present

## 2021-03-22 DIAGNOSIS — Z79899 Other long term (current) drug therapy: Secondary | ICD-10-CM | POA: Diagnosis not present

## 2021-03-22 DIAGNOSIS — M810 Age-related osteoporosis without current pathological fracture: Secondary | ICD-10-CM | POA: Diagnosis not present

## 2021-04-14 DIAGNOSIS — M0579 Rheumatoid arthritis with rheumatoid factor of multiple sites without organ or systems involvement: Secondary | ICD-10-CM | POA: Diagnosis not present

## 2021-06-01 DIAGNOSIS — Z79899 Other long term (current) drug therapy: Secondary | ICD-10-CM | POA: Diagnosis not present

## 2021-06-01 DIAGNOSIS — M0579 Rheumatoid arthritis with rheumatoid factor of multiple sites without organ or systems involvement: Secondary | ICD-10-CM | POA: Diagnosis not present

## 2021-06-15 DIAGNOSIS — M810 Age-related osteoporosis without current pathological fracture: Secondary | ICD-10-CM | POA: Diagnosis not present

## 2021-06-15 DIAGNOSIS — G894 Chronic pain syndrome: Secondary | ICD-10-CM | POA: Diagnosis not present

## 2021-06-15 DIAGNOSIS — Z79899 Other long term (current) drug therapy: Secondary | ICD-10-CM | POA: Diagnosis not present

## 2021-06-15 DIAGNOSIS — M150 Primary generalized (osteo)arthritis: Secondary | ICD-10-CM | POA: Diagnosis not present

## 2021-06-15 DIAGNOSIS — M0579 Rheumatoid arthritis with rheumatoid factor of multiple sites without organ or systems involvement: Secondary | ICD-10-CM | POA: Diagnosis not present

## 2021-06-15 DIAGNOSIS — Z681 Body mass index (BMI) 19 or less, adult: Secondary | ICD-10-CM | POA: Diagnosis not present

## 2021-06-21 DIAGNOSIS — E7800 Pure hypercholesterolemia, unspecified: Secondary | ICD-10-CM | POA: Diagnosis not present

## 2021-06-21 DIAGNOSIS — I480 Paroxysmal atrial fibrillation: Secondary | ICD-10-CM | POA: Diagnosis not present

## 2021-06-21 DIAGNOSIS — F324 Major depressive disorder, single episode, in partial remission: Secondary | ICD-10-CM | POA: Diagnosis not present

## 2021-06-21 DIAGNOSIS — B0229 Other postherpetic nervous system involvement: Secondary | ICD-10-CM | POA: Diagnosis not present

## 2021-06-21 DIAGNOSIS — G894 Chronic pain syndrome: Secondary | ICD-10-CM | POA: Diagnosis not present

## 2021-06-21 DIAGNOSIS — I499 Cardiac arrhythmia, unspecified: Secondary | ICD-10-CM | POA: Diagnosis not present

## 2021-06-21 DIAGNOSIS — E039 Hypothyroidism, unspecified: Secondary | ICD-10-CM | POA: Diagnosis not present

## 2021-06-21 DIAGNOSIS — E1169 Type 2 diabetes mellitus with other specified complication: Secondary | ICD-10-CM | POA: Diagnosis not present

## 2021-06-21 DIAGNOSIS — I10 Essential (primary) hypertension: Secondary | ICD-10-CM | POA: Diagnosis not present

## 2021-06-21 DIAGNOSIS — M069 Rheumatoid arthritis, unspecified: Secondary | ICD-10-CM | POA: Diagnosis not present

## 2021-06-28 ENCOUNTER — Encounter: Payer: Self-pay | Admitting: Cardiology

## 2021-06-28 ENCOUNTER — Ambulatory Visit: Payer: Medicare Other | Admitting: Cardiology

## 2021-06-28 ENCOUNTER — Other Ambulatory Visit: Payer: Self-pay

## 2021-06-28 VITALS — BP 143/83 | HR 101 | Temp 97.9°F | Ht 63.0 in | Wt 113.0 lb

## 2021-06-28 DIAGNOSIS — I10 Essential (primary) hypertension: Secondary | ICD-10-CM | POA: Diagnosis not present

## 2021-06-28 DIAGNOSIS — E039 Hypothyroidism, unspecified: Secondary | ICD-10-CM | POA: Diagnosis not present

## 2021-06-28 DIAGNOSIS — I700 Atherosclerosis of aorta: Secondary | ICD-10-CM

## 2021-06-28 DIAGNOSIS — I011 Acute rheumatic endocarditis: Secondary | ICD-10-CM | POA: Diagnosis not present

## 2021-06-28 DIAGNOSIS — R0602 Shortness of breath: Secondary | ICD-10-CM | POA: Diagnosis not present

## 2021-06-28 DIAGNOSIS — Z7901 Long term (current) use of anticoagulants: Secondary | ICD-10-CM | POA: Diagnosis not present

## 2021-06-28 DIAGNOSIS — I4819 Other persistent atrial fibrillation: Secondary | ICD-10-CM | POA: Diagnosis not present

## 2021-06-28 DIAGNOSIS — E119 Type 2 diabetes mellitus without complications: Secondary | ICD-10-CM

## 2021-06-28 MED ORDER — APIXABAN 5 MG PO TABS: 5.0000 mg | ORAL_TABLET | Freq: Two times a day (BID) | ORAL | 0 refills | Status: DC

## 2021-06-28 NOTE — Progress Notes (Signed)
Date:  06/28/2021   ID:  [name], DOB [birth year], MRN [id number]  PCP:  Shirline Frees, MD  Cardiologist:  Rex Kras, DO, [facility] (established care 06/28/21)  REASON FOR CONSULT: Atrial fibrillation  REQUESTING PHYSICIAN:  Shirline Frees, MD [address] [place],  [place] [address]  Chief Complaint  Patient presents with   Atrial Fibrillation   New Patient (Initial Visit)    HPI  [name] is a 71 y.o. Caucasian female who presents to the office with a chief complaint of " new onset of atrial fibrillation." Patient's past medical history and cardiovascular risk factors include: Hx of breast cancer twice (index event lumpectomy/chemo/radiation, second episode left mastectomy), HTN, Atrial fibrillation, hypothyroidism, NIDDM Type II, RA, post menopausal, advance age, former smoker, aortic atherosclerosis.   She is referred to the office at the request of Shirline Frees, MD for evaluation of atrial fibrillation.  Patient was recently diagnosed with shingles which lasted approximately 3 weeks and towards the tail end of the viral infection she started experiencing palpitations and shortness of breath.  Soon thereafter followed up with PCP and was noted to have atrial fibrillation (newly discovered).  She was requested to go to ED for further evaluation and management; however, patient refused and therefore she was started on diltiazem 120 mg p.o. daily and referred to cardiology for further evaluation and management.  She is been taking the diltiazem 120 mg p.o. daily for the last 3 days and her ventricular rate is still greater than 100 bpm.  She denies any symptoms of palpitations, anginal discomfort, near-syncope or syncope.  At times she does have shortness of breath with effort related activities but this is slowly improving.  Patient was not started on Eliquis by her PCP but he did discuss it per patient.  No prior history of intracranial bleeding or  gastrointestinal bleeding.  She did have a history of duodenal ulcer and has undergone surgical correction.  FUNCTIONAL STATUS: No structured exercise program or daily routine.   ALLERGIES: Allergies  Allergen Reactions   Fish Allergy Anaphylaxis and Hives   Amaryl [Glimepiride] Itching   Avalide [Irbesartan-Hydrochlorothiazide] Itching   Avandia [Rosiglitazone] Itching   Byetta 10 Mcg Pen [Exenatide] Itching   Codeine Itching    Other reaction(s): Unknown   Demerol [Meperidine Hcl] Itching   Glucovance [Glyburide-Metformin] Itching   Iodinated Contrast Media Itching    Other reaction(s): Unknown   Lisinopril Itching   Naprosyn [Naproxen] Hives   Penicillins Itching and Other (See Comments)    Has patient had a PCN reaction causing immediate rash, facial/tongue/throat swelling, SOB or lightheadedness with hypotension: no Has patient had a PCN reaction causing severe rash involving mucus membranes or skin necrosis: no Has patient had a PCN reaction that required hospitalization: yes Has patient had a PCN reaction occurring within the last 10 years: no If all of the above answers are "NO", then may proceed with Cephalosporin use.    MEDICATION LIST PRIOR TO VISIT: Current Meds  Medication Sig   abatacept (ORENCIA) 250 MG injection Inject into the vein every 30 (thirty) days.   apixaban (ELIQUIS) 5 MG TABS tablet Take 1 tablet (5 mg total) by mouth 2 (two) times daily.   buPROPion (WELLBUTRIN XL) 150 MG 24 hr tablet Take 150 mg by mouth every morning.   CITALOPRAM HYDROBROMIDE PO Take by mouth.   cloNIDine (CATAPRES) 0.3 MG tablet Take 1 tablet by mouth 2 (two) times daily.   denosumab (PROLIA) 60 MG/ML  SOSY injection Inject 60 mg into the skin every 6 (six) months.   diltiazem (CARDIZEM CD) 120 MG 24 hr capsule Take 1 capsule by mouth 2 (two) times daily.   escitalopram (LEXAPRO) 10 MG tablet Take 10 mg by mouth daily.   esomeprazole (NEXIUM) 20 MG capsule Take 40 mg by mouth  daily.    folic acid (FOLVITE) 1 MG tablet Take 1 mg by mouth daily.   HYDROcodone-acetaminophen (NORCO) 10-325 MG tablet Take 1 tablet by mouth every 6 (six) hours as needed (For pain.).   hydrocortisone (ANUSOL-HC) 25 MG suppository Place 25 mg rectally 2 (two) times daily as needed for hemorrhoids.    hydroxychloroquine (PLAQUENIL) 200 MG tablet Take 400 mg by mouth daily.   levothyroxine (SYNTHROID) 175 MCG tablet Take 1 tablet by mouth daily.   metFORMIN (GLUCOPHAGE-XR) 500 MG 24 hr tablet Take 4 tablets by mouth daily.   morphine (MS CONTIN) 30 MG 12 hr tablet Take 30 mg by mouth every 12 (twelve) hours.   Multiple Vitamin (MULTIVITAMIN WITH MINERALS) TABS tablet Take 1 tablet by mouth daily.   ondansetron (ZOFRAN-ODT) 8 MG disintegrating tablet Take 8 mg by mouth every 8 (eight) hours as needed for nausea.   pravastatin (PRAVACHOL) 40 MG tablet Take 40 mg by mouth daily.   sucralfate (CARAFATE) 1 GM/10ML suspension Take 10 mLs (1 g total) by mouth 4 (four) times daily.   valACYclovir (VALTREX) 1000 MG tablet Take 1,000 mg by mouth 2 (two) times daily.   [DISCONTINUED] ondansetron (ZOFRAN) 4 MG tablet Take 2 tablets (8 mg total) by mouth 3 (three) times daily before meals. Take 30 minutes before meals     PAST MEDICAL HISTORY: Past Medical History:  Diagnosis Date   Asthma, mild    Intermittent   Atrial fibrillation [facility])    Feb 2023   Cancer [facility])    Chronic back pain    Echocardiogram abnormal 09/2008   With mild aortic valve and mitral valve regurgitation   Heart murmur    History of left breast cancer 1999   Hyperlipidemia    Hypertension    Hypothyroidism    IBS (irritable bowel syndrome)    Lactose intolerance    Osteoporosis    Peptic ulcer disease    Type II diabetes mellitus ([place])    [facility] 08-2012   Ulcer, duodenal, acute, with obstruction 1999    PAST SURGICAL HISTORY: Past Surgical History:  Procedure Laterality Date   ABDOMINAL HYSTERECTOMY     BREAST  IMPLANT REMOVAL     Breast implant infected removed implant 06/06 infected and removed 11/05   BREAST LUMPECTOMY  09/99   Left breast   BREAST RECONSTRUCTION     CHOLECYSTECTOMY     CYSTOSCOPY W/ URETERAL STENT PLACEMENT Left 01/19/2018   Procedure: CYSTOSCOPY WITH RETROGRADE PYELOGRAM/URETERAL STENT PLACEMENT;  Surgeon: Franchot Gallo, MD;  Location: [facility];  Service: Urology;  Laterality: Left;   EXTRACORPOREAL SHOCK WAVE LITHOTRIPSY Left 02/15/2018   Procedure: LEFT EXTRACORPOREAL SHOCK WAVE LITHOTRIPSY (ESWL);  Surgeon: Cleon Gustin, MD;  Location: [facility];  Service: Urology;  Laterality: Left;   MASTECTOMY  03/05   Left   other     Left Arm FX   OTHER SURGICAL HISTORY     Hysterectomy- Ovaries intact    OTHER SURGICAL HISTORY     Fracture Left HIp   PARTIAL GASTRECTOMY  1999    FAMILY HISTORY: The patient family history includes Diabetes in her brother, father, and sister;  Diabetes Mellitus II in her father; Heart attack in her father; Heart failure in her mother; Hypertension in her father; Osteoporosis in her mother; Thyroid cancer in her sister.  SOCIAL HISTORY:  The patient  reports that she quit smoking about 21 years ago. Her smoking use included cigarettes. She has never used smokeless tobacco. She reports that she does not drink alcohol and does not use drugs.  REVIEW OF SYSTEMS: Review of Systems  Constitutional: Positive for malaise/fatigue.  Cardiovascular:  Positive for dyspnea on exertion (improving) and irregular heartbeat. Negative for chest pain, leg swelling, near-syncope, orthopnea, palpitations, paroxysmal nocturnal dyspnea and syncope.  Respiratory:  Positive for shortness of breath (better).    PHYSICAL EXAM: Vitals with BMI 06/28/2021 06/28/2021 07/29/2020  Height - 5\' 3"  -  Weight - 113 lbs -  BMI - 76.54 -  Systolic 650 354 656  Diastolic 83 88 60  Pulse 812 106 80    CONSTITUTIONAL: Well-developed and well-nourished. No acute distress.   SKIN: Skin is warm and dry. No rash noted. No cyanosis. No pallor. No jaundice HEAD: Normocephalic and atraumatic.  EYES: No scleral icterus MOUTH/THROAT: Moist oral membranes.  NECK: No JVD present. No thyromegaly noted. No carotid bruits  LYMPHATIC: No visible cervical adenopathy.  CHEST Normal respiratory effort. No intercostal retractions.  Left mastectomy LUNGS: Clear to auscultation bilaterally.  No stridor. No wheezes. No rales.  CARDIOVASCULAR: Irregularly irregular, variable S1-S2, tachycardia, no murmurs rubs or gallops appreciated secondary to tachycardia.   ABDOMINAL: Obese, soft, nontender, nondistended, positive bowel sounds all 4 quadrants. No apparent ascites.  EXTREMITIES: No peripheral edema, warm to touch, +1 bilateral DP and PT pulses HEMATOLOGIC: No significant bruising NEUROLOGIC: Oriented to person, place, and time. Nonfocal. Normal muscle tone.  PSYCHIATRIC: Normal mood and affect. Normal behavior. Cooperative  CARDIAC DATABASE: EKG: 06/28/2021: Atrial fibrillation, 107pm, nonspecific ST-T changes, without underlying injury pattern.   Echocardiogram: No results found for this or any previous visit from the past 1095 days.    Stress Testing: No results found for this or any previous visit from the past 1095 days.   Heart Catheterization: None  LABORATORY DATA: CBC Latest Ref Rng & Units 01/22/2018 01/21/2018 01/20/2018  WBC 4.0 - 10.5 K/uL 21.8(H) 29.2(H) 45.6(H)  Hemoglobin 12.0 - 15.0 g/dL 10.0(L) 9.9(L) 11.2(L)  Hematocrit 36.0 - 46.0 % 29.8(L) 30.6(L) 34.3(L)  Platelets 150 - 400 K/uL 142(L) 135(L) 206    CMP Latest Ref Rng & Units 01/22/2018 01/21/2018 01/20/2018  Glucose 70 - 99 mg/dL 131(H) 131(H) 139(H)  BUN 8 - 23 mg/dL 13 11 13   Creatinine 0.44 - 1.00 mg/dL 0.46 0.50 0.59  Sodium 135 - 145 mmol/L 138 141 141  Potassium 3.5 - 5.1 mmol/L 3.8 3.6 3.7  Chloride 98 - 111 mmol/L 100 105 104  CO2 22 - 32 mmol/L 27 28 27   Calcium 8.9 - 10.3 mg/dL  7.8(L) 7.7(L) 7.8(L)  Total Protein 6.5 - 8.1 g/dL - - 5.9(L)  Total Bilirubin 0.3 - 1.2 mg/dL - - 1.0  Alkaline Phos 38 - 126 U/L - - 41  AST 15 - 41 U/L - - 87(H)  ALT 0 - 44 U/L - - 133(H)    Lipid Panel  No results found for: CHOL, TRIG, HDL, CHOLHDL, VLDL, LDLCALC, LDLDIRECT, LABVLDL  No components found for: NTPROBNP No results for input(s): PROBNP in the last 8760 hours. No results for input(s): TSH in the last 8760 hours.  BMP No results for input(s): NA, K, CL, CO2,  GLUCOSE, BUN, CREATININE, CALCIUM, GFRNONAA, GFRAA in the last 8760 hours.  HEMOGLOBIN A1C No results found for: HGBA1C, MPG  External Labs: Collected: 03/12/2021 provided by referring physician. TSH 0.11 (below normal limits). Free T4   1.27 (above normal limits.) Total cholesterol 114, triglycerides 110, HDL 67, LDL 27 Hemoglobin A1c 6.8.  External Labs: Collected: 06/21/2021 provided by referring physician. TSH 0.87 (within normal limits)   IMPRESSION:    ICD-10-CM   1. Persistent atrial fibrillation (HCC)  I48.19 EKG 12-Lead    2. Long term (current) use of anticoagulants  Z79.01 apixaban (ELIQUIS) 5 MG TABS tablet    Hemoglobin and hematocrit, blood    3. Shortness of breath  R06.02 Pro b natriuretic peptide (BNP)    Basic metabolic panel    4. Benign hypertension  I10     5. Non-insulin dependent type 2 diabetes mellitus ([place])  E11.9     6. 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 Atherosclerosis of aorta (HCC)  I70.0        RECOMMENDATIONS: [name] is a 71 y.o. Caucasian female whose past medical history and cardiac risk factors include: Hx of breast cancer twice (index event lumpectomy/chemo/radiation, second episode left mastectomy), HTN, Atrial fibrillation, hypothyroidism, NIDDM Type II, RA, post menopausal, advance age, former smoker, aortic atherosclerosis.   Persistent atrial fibrillation (HCC) Rate control: Diltiazem. Rhythm  control: N/A. Thromboembolic prophylaxis Eliquis We will increase diltiazem dose to 120 mg p.o. twice daily -patient is asked to keep a log of her blood pressures and pulse. Discovered February 2023, duration unknown.  Would like to restore normal sinus rhythm prior to considering echocardiogram and stress test to rule out ischemic substrate.  However, given her history of ulcers status post surgical intervention we will start her on oral anticoagulation to see if she is able to tolerate AC without any significant side effects.  Long term (current) use of anticoagulants Indication: Atrial fibrillation No prior history of intracranial bleeding, gastrointestinal bleeding, or recent surgeries. Patient has been educated on the importance of monitoring for evidence of bleeding which includes but not limited to hemoptysis, hematochezia, melanotic stools, and hematuria. Patient educated on fall precautions and if she was to be injured despite the mechanism of injury she is to seek medical attention at the closest ER since she is on blood thinners.  Patient understands importance of this because if internal bleeding is not treated in a timely manner it may further lead to morbidity and/or mortality.  Patient voices understanding of these recommendations and provides verbal feedback. Discussed the risks, benefits, alternatives to oral anticoagulation as discussed above. Also discussed medication profile for Coumadin, Eliquis, Xarelto,etc.  Shared decision was to start Eliquis 5 mg p.o. twice daily Labs to evaluate kidney function and H&H.  Samples for Eliquis as well as assistance cards provided. Patient is instructed to hold off on filling the prescription for Eliquis to make sure renal function and hemoglobin are relatively stable.  Shortness of breath Likely due to A-fib with RVR. Ventricular rate is improving. Requested she go to ED for parenteral medications; however, patient refuses. Ischemic  work-up once she restored normal sinus rhythm as discussed above. Patient is instructed to go to the ED if her heart rate is not well controlled, has episodes of hypotension, or strokelike symptoms.   FINAL MEDICATION LIST END OF ENCOUNTER: Meds ordered this encounter  Medications   apixaban (ELIQUIS) 5 MG TABS tablet  Sig: Take 1 tablet (5 mg total) by mouth 2 (two) times daily.    Dispense:  180 tablet    Refill:  0    Medications Discontinued During This Encounter  Medication Reason   amLODipine (NORVASC) 10 MG tablet    metFORMIN (GLUCOPHAGE-XR) 500 MG 24 hr tablet Duplicate   levothyroxine (SYNTHROID, LEVOTHROID) 175 MCG tablet Duplicate   cloNIDine (CATAPRES) 0.3 MG tablet Duplicate   ondansetron (ZOFRAN) 4 MG tablet Duplicate     Current Outpatient Medications:    abatacept (ORENCIA) 250 MG injection, Inject into the vein every 30 (thirty) days., Disp: , Rfl:    apixaban (ELIQUIS) 5 MG TABS tablet, Take 1 tablet (5 mg total) by mouth 2 (two) times daily., Disp: 180 tablet, Rfl: 0   buPROPion (WELLBUTRIN XL) 150 MG 24 hr tablet, Take 150 mg by mouth every morning., Disp: , Rfl:    CITALOPRAM HYDROBROMIDE PO, Take by mouth., Disp: , Rfl:    cloNIDine (CATAPRES) 0.3 MG tablet, Take 1 tablet by mouth 2 (two) times daily., Disp: , Rfl:    denosumab (PROLIA) 60 MG/ML SOSY injection, Inject 60 mg into the skin every 6 (six) months., Disp: , Rfl:    diltiazem (CARDIZEM CD) 120 MG 24 hr capsule, Take 1 capsule by mouth 2 (two) times daily., Disp: , Rfl:    escitalopram (LEXAPRO) 10 MG tablet, Take 10 mg by mouth daily., Disp: , Rfl:    esomeprazole (NEXIUM) 20 MG capsule, Take 40 mg by mouth daily. , Disp: , Rfl:    folic acid (FOLVITE) 1 MG tablet, Take 1 mg by mouth daily., Disp: , Rfl:    HYDROcodone-acetaminophen (NORCO) 10-325 MG tablet, Take 1 tablet by mouth every 6 (six) hours as needed (For pain.)., Disp: , Rfl:    hydrocortisone (ANUSOL-HC) 25 MG suppository, Place 25 mg  rectally 2 (two) times daily as needed for hemorrhoids. , Disp: , Rfl:    hydroxychloroquine (PLAQUENIL) 200 MG tablet, Take 400 mg by mouth daily., Disp: , Rfl:    levothyroxine (SYNTHROID) 175 MCG tablet, Take 1 tablet by mouth daily., Disp: , Rfl:    metFORMIN (GLUCOPHAGE-XR) 500 MG 24 hr tablet, Take 4 tablets by mouth daily., Disp: , Rfl:    morphine (MS CONTIN) 30 MG 12 hr tablet, Take 30 mg by mouth every 12 (twelve) hours., Disp: , Rfl:    Multiple Vitamin (MULTIVITAMIN WITH MINERALS) TABS tablet, Take 1 tablet by mouth daily., Disp: , Rfl:    ondansetron (ZOFRAN-ODT) 8 MG disintegrating tablet, Take 8 mg by mouth every 8 (eight) hours as needed for nausea., Disp: , Rfl:    pravastatin (PRAVACHOL) 40 MG tablet, Take 40 mg by mouth daily., Disp: , Rfl:    sucralfate (CARAFATE) 1 GM/10ML suspension, Take 10 mLs (1 g total) by mouth 4 (four) times daily., Disp: 420 mL, Rfl: 8   valACYclovir (VALTREX) 1000 MG tablet, Take 1,000 mg by mouth 2 (two) times daily., Disp: , Rfl:   Orders Placed This Encounter  Procedures   Hemoglobin and hematocrit, blood   Pro b natriuretic peptide (BNP)   Basic metabolic panel   EKG 10-CHEN    There are no Patient Instructions on file for this visit.   --Continue cardiac medications as reconciled in final medication list. --Return in about 4 weeks (around 07/26/2021) for Follow up, A. fib, EKG on arrival.. Or sooner if needed. --Continue follow-up with your primary care physician regarding the management of your other chronic comorbid  conditions.  Patient's questions and concerns were addressed to her satisfaction. She voices understanding of the instructions provided during this encounter.   This note was created using a voice recognition software as a result there may be grammatical errors inadvertently enclosed that do not reflect the nature of this encounter. Every attempt is made to correct such errors.  Rex Kras, [place], [facility]  Pager:  [phone] Office: [phone]

## 2021-06-29 DIAGNOSIS — R0602 Shortness of breath: Secondary | ICD-10-CM | POA: Diagnosis not present

## 2021-06-29 DIAGNOSIS — M0579 Rheumatoid arthritis with rheumatoid factor of multiple sites without organ or systems involvement: Secondary | ICD-10-CM | POA: Diagnosis not present

## 2021-06-29 DIAGNOSIS — E119 Type 2 diabetes mellitus without complications: Secondary | ICD-10-CM | POA: Diagnosis not present

## 2021-07-02 LAB — BASIC METABOLIC PANEL
BUN/Creatinine Ratio: 14 (ref 12–28)
BUN: 11 mg/dL (ref 8–27)
Calcium: 8.9 mg/dL (ref 8.7–10.3)
Chloride: 99 mmol/L (ref 96–106)
Creatinine, Ser: 0.78 mg/dL (ref 0.57–1.00)
Glucose: 187 mg/dL — ABNORMAL HIGH (ref 70–99)
Potassium: 3.9 mmol/L (ref 3.5–5.2)
Sodium: 140 mmol/L (ref 134–144)
eGFR: 81 mL/min/{1.73_m2} (ref >59)

## 2021-07-02 LAB — HEMOGLOBIN AND HEMATOCRIT, BLOOD
Hematocrit: 33.5 % — ABNORMAL LOW (ref 34.0–46.6)
Hemoglobin: 11.1 g/dL (ref 11.1–15.9)

## 2021-07-02 LAB — PRO B NATRIURETIC PEPTIDE: NT-Pro BNP: 1604 pg/mL — ABNORMAL HIGH (ref 0–301)

## 2021-07-02 NOTE — Progress Notes (Signed)
Spoke to patient she is aware of results

## 2021-07-05 ENCOUNTER — Telehealth: Payer: Self-pay

## 2021-07-05 NOTE — Telephone Encounter (Signed)
Tried calling patient no answer left a vm

## 2021-07-05 NOTE — Telephone Encounter (Signed)
Please have her reduce Cardizem to '120mg'$  po qday.  ? ?ST

## 2021-07-08 ENCOUNTER — Telehealth: Payer: Self-pay

## 2021-07-08 DIAGNOSIS — I4819 Other persistent atrial fibrillation: Secondary | ICD-10-CM

## 2021-07-08 NOTE — Telephone Encounter (Signed)
Pt stated that recently prescribed medication did not work. She took the medication yesterday and she stated that her HR went up about an hour later 133/89 HR 107. Pt states she has no dizziness or SOB, just the fast HR. She has not taken the medication today. She is unsure of what to do and does not know if it is the Eliquis or the Diltiazem that is causing this.  ?

## 2021-07-09 MED ORDER — DILTIAZEM HCL ER COATED BEADS 240 MG PO CP24: 240.0000 mg | ORAL_CAPSULE | Freq: Every day | ORAL | 0 refills | Status: DC

## 2021-07-09 NOTE — Telephone Encounter (Signed)
Currently experiences palpitation and she is taking diltiazem 120 mg p.o. daily. ?Heart rate is still greater than 100 bpm.  She is maintaining a good blood pressure. ?I have asked her to increase diltiazem 120 mg 2 tablets daily.   ?We will send a new prescription. ?When she picks up the new prescription she is asked to take 1 tablet a day (diltiazem to 40 mg p.o. daily).  ? ?Rex Kras, DO, FACC ? ?Pager: [phone] ?Office: [phone] ? ?

## 2021-07-19 ENCOUNTER — Telehealth: Payer: Self-pay

## 2021-07-21 NOTE — Telephone Encounter (Signed)
Called the patient but had to leave a voicemail.  ?Will try again.  ? ?Dr. Terri Skains  ?

## 2021-07-23 NOTE — Telephone Encounter (Signed)
Spoke to the patient over the phone. ? ?Her ventricular rate has improved and her frequencies of tachycardia have improved on the higher dose of diltiazem. ? ?The breakthrough episodes of tachycardia may also be secondary to her now being weaned off of clonidine. ? ?We discussed going up on the dose of diltiazem so that she is more comfortable over the weekend; however, she would like to hold off on any changes until she sees me in the office next week. ? ?Patient was thankful for the call back. ? ?Rex Kras, DO, FACC ? ?Pager: [phone] ?Office: [phone]  ?

## 2021-07-26 ENCOUNTER — Encounter: Payer: Self-pay | Admitting: Cardiology

## 2021-07-26 ENCOUNTER — Other Ambulatory Visit: Payer: Self-pay

## 2021-07-26 ENCOUNTER — Ambulatory Visit: Payer: Medicare Other | Admitting: Cardiology

## 2021-07-26 VITALS — BP 155/88 | HR 60 | Temp 97.7°F | Resp 16 | Ht 63.0 in | Wt 108.0 lb

## 2021-07-26 DIAGNOSIS — E039 Hypothyroidism, unspecified: Secondary | ICD-10-CM

## 2021-07-26 DIAGNOSIS — I4819 Other persistent atrial fibrillation: Secondary | ICD-10-CM

## 2021-07-26 DIAGNOSIS — R0602 Shortness of breath: Secondary | ICD-10-CM

## 2021-07-26 DIAGNOSIS — I011 Acute rheumatic endocarditis: Secondary | ICD-10-CM | POA: Diagnosis not present

## 2021-07-26 DIAGNOSIS — E119 Type 2 diabetes mellitus without complications: Secondary | ICD-10-CM | POA: Diagnosis not present

## 2021-07-26 DIAGNOSIS — I10 Essential (primary) hypertension: Secondary | ICD-10-CM | POA: Diagnosis not present

## 2021-07-26 DIAGNOSIS — Z7901 Long term (current) use of anticoagulants: Secondary | ICD-10-CM

## 2021-07-26 DIAGNOSIS — I700 Atherosclerosis of aorta: Secondary | ICD-10-CM

## 2021-07-26 MED ORDER — METOPROLOL SUCCINATE ER 100 MG PO TB24: 100.0000 mg | ORAL_TABLET | Freq: Every day | ORAL | 0 refills | Status: DC

## 2021-07-26 NOTE — Progress Notes (Signed)
? ?ID:  [name], DOB Dec 28, 1950, MRN [id number] ? ?PCP:  Shirline Frees, MD  ?Cardiologist:  Rex Kras, DO, [facility] (established care 06/28/21) ? ?Date: 07/26/2021 ?Last Office Visit: 06/28/2021 ? ?Chief Complaint  ?Patient presents with  ? Atrial Fibrillation  ? Follow-up  ? ? ?HPI  ?[name] is a 71 y.o. Caucasian female whose past medical history and cardiovascular risk factors include: Hx of breast cancer twice (index event lumpectomy/chemo/radiation, second episode left mastectomy), HTN, Atrial fibrillation, hypothyroidism, NIDDM Type II, RA, post menopausal, advance age, former smoker, aortic atherosclerosis.  ? ?She is referred to the office at the request of Shirline Frees, MD for evaluation of atrial fibrillation. ? ?Recently was diagnosed with shingles and towards the tail end of the viral infection had symptoms of palpitations.  She went back to her PCP for evaluation and was noted to be in atrial fibrillation, newly discovered.  At that time despite her rapid ventricular rate patient chose not to go to ED for evaluation and management and was started on diltiazem and referred to cardiology for further evaluation and management.  At last office visit we increased her diltiazem to 240 mg p.o. daily and her symptoms of palpitations have improved but intermittently more noticeable at times as well.  She has been on oral anticoagulation since last office visit 06/28/2021 without interruption per patient.  She does not endorse any evidence of bleeding. ? ?No prior history of intracranial bleeding.  She did have a history of duodenal ulcer which was surgically corrected per patient.  Since then no recurrence of gastrointestinal bleeding. ? ?FUNCTIONAL STATUS: ?No structured exercise program or daily routine.  ? ?ALLERGIES: ?Allergies  ?Allergen Reactions  ? Fish Allergy Anaphylaxis and Hives  ? Amaryl [Glimepiride] Itching  ? Avalide [Irbesartan-Hydrochlorothiazide] Itching  ? Avandia [Rosiglitazone]  Itching  ? Byetta 10 Mcg Pen [Exenatide] Itching  ? Codeine Itching  ?  Other reaction(s): Unknown  ? Demerol [Meperidine Hcl] Itching  ? Glucovance [Glyburide-Metformin] Itching  ? Iodinated Contrast Media Itching  ?  Other reaction(s): Unknown  ? Lisinopril Itching  ? Naprosyn [Naproxen] Hives  ? Penicillins Itching and Other (See Comments)  ?  Has patient had a PCN reaction causing immediate rash, facial/tongue/throat swelling, SOB or lightheadedness with hypotension: no ?Has patient had a PCN reaction causing severe rash involving mucus membranes or skin necrosis: no ?Has patient had a PCN reaction that required hospitalization: yes ?Has patient had a PCN reaction occurring within the last 10 years: no ?If all of the above answers are "NO", then may proceed with Cephalosporin use.  ? ? ?MEDICATION LIST PRIOR TO VISIT: ?Current Meds  ?Medication Sig  ? abatacept (ORENCIA) 250 MG injection Inject into the vein every 30 (thirty) days.  ? apixaban (ELIQUIS) 5 MG TABS tablet Take 1 tablet (5 mg total) by mouth 2 (two) times daily.  ? buPROPion (WELLBUTRIN XL) 150 MG 24 hr tablet Take 150 mg by mouth every morning.  ? denosumab (PROLIA) 60 MG/ML SOSY injection Inject 60 mg into the skin every 6 (six) months.  ? diltiazem (CARDIZEM CD) 300 MG 24 hr capsule Take 1 capsule (300 mg total) by mouth daily.  ? escitalopram (LEXAPRO) 10 MG tablet Take 10 mg by mouth daily.  ? esomeprazole (NEXIUM) 20 MG capsule Take 40 mg by mouth daily.   ? folic acid (FOLVITE) 1 MG tablet Take 2 mg by mouth daily.  ? HYDROcodone-acetaminophen (NORCO) 10-325 MG tablet Take 1 tablet by mouth every 6 (  six) hours as needed (For pain.).  ? hydrocortisone (ANUSOL-HC) 25 MG suppository Place 25 mg rectally 2 (two) times daily as needed for hemorrhoids.   ? hydroxychloroquine (PLAQUENIL) 200 MG tablet Take 400 mg by mouth daily.  ? levothyroxine (SYNTHROID) 175 MCG tablet Take 1 tablet by mouth daily.  ? metFORMIN (GLUCOPHAGE-XR) 500 MG 24 hr  tablet Take 4 tablets by mouth daily.  ? morphine (MS CONTIN) 30 MG 12 hr tablet Take 30 mg by mouth every 12 (twelve) hours.  ? Multiple Vitamin (MULTIVITAMIN WITH MINERALS) TABS tablet Take 1 tablet by mouth daily.  ? pravastatin (PRAVACHOL) 40 MG tablet Take 40 mg by mouth daily.  ? [DISCONTINUED] diltiazem (CARDIZEM CD) 240 MG 24 hr capsule Take 1 capsule (240 mg total) by mouth daily.  ? [DISCONTINUED] metoprolol succinate (TOPROL XL) 100 MG 24 hr tablet Take 1 tablet (100 mg total) by mouth daily. Take with or immediately following a meal.  ?  ? ?PAST MEDICAL HISTORY: ?Past Medical History:  ?Diagnosis Date  ? Asthma, mild   ? Intermittent  ? Atrial fibrillation ([place])   ? Feb 2023  ? Cancer [facility])   ? Chronic back pain   ? Echocardiogram abnormal 09/2008  ? With mild aortic valve and mitral valve regurgitation  ? Heart murmur   ? History of left breast cancer 1999  ? Hyperlipidemia   ? Hypertension   ? Hypothyroidism   ? IBS (irritable bowel syndrome)   ? Lactose intolerance   ? Osteoporosis   ? Peptic ulcer disease   ? Type II diabetes mellitus ([place])   ? [facility] 08-2012  ? Ulcer, duodenal, acute, with obstruction 1999  ? ? ?PAST SURGICAL HISTORY: ?Past Surgical History:  ?Procedure Laterality Date  ? ABDOMINAL HYSTERECTOMY    ? BREAST IMPLANT REMOVAL    ? Breast implant infected removed implant 06/06 infected and removed 11/05  ? BREAST LUMPECTOMY  09/99  ? Left breast  ? BREAST RECONSTRUCTION    ? CHOLECYSTECTOMY    ? CYSTOSCOPY W/ URETERAL STENT PLACEMENT Left 01/19/2018  ? Procedure: CYSTOSCOPY WITH RETROGRADE PYELOGRAM/URETERAL STENT PLACEMENT;  Surgeon: Franchot Gallo, MD;  Location: [facility];  Service: Urology;  Laterality: Left;  ? EXTRACORPOREAL SHOCK WAVE LITHOTRIPSY Left 02/15/2018  ? Procedure: LEFT EXTRACORPOREAL SHOCK WAVE LITHOTRIPSY (ESWL);  Surgeon: Cleon Gustin, MD;  Location: [facility];  Service: Urology;  Laterality: Left;  ? MASTECTOMY  03/05  ? Left  ? other    ? Left Arm FX  ? OTHER  SURGICAL HISTORY    ? Hysterectomy- Ovaries intact   ? OTHER SURGICAL HISTORY    ? Fracture Left HIp  ? PARTIAL GASTRECTOMY  1999  ? ? ?FAMILY HISTORY: ?The patient family history includes Diabetes in her brother, father, and sister; Diabetes Mellitus II in her father; Heart attack in her father; Heart failure in her mother; Hypertension in her father; Osteoporosis in her mother; Thyroid cancer in her sister. ? ?SOCIAL HISTORY:  ?The patient  reports that she quit smoking about 21 years ago. Her smoking use included cigarettes. She has never used smokeless tobacco. She reports that she does not drink alcohol and does not use drugs. ? ?REVIEW OF SYSTEMS: ?Review of Systems  ?Constitutional: Positive for malaise/fatigue.  ?Cardiovascular:  Positive for dyspnea on exertion (improving) and irregular heartbeat. Negative for chest pain, leg swelling, near-syncope, orthopnea, palpitations, paroxysmal nocturnal dyspnea and syncope.  ?Respiratory:  Positive for shortness of breath (better).   ? ?PHYSICAL EXAM: ? ?  07/26/2021  ?  2:42 PM 06/28/2021  ?  2:20 PM 06/28/2021  ?  2:16 PM  ?Vitals with BMI  ?Height '5\' 3"'$   '5\' 3"'$   ?Weight 108 lbs  113 lbs  ?BMI 19.14  20.02  ?Systolic 974 163 845  ?Diastolic 88 83 88  ?Pulse 60 101 106  ? ? ?CONSTITUTIONAL: Well-developed and well-nourished. No acute distress.  ?SKIN: Skin is warm and dry. No rash noted. No cyanosis. No pallor. No jaundice ?HEAD: Normocephalic and atraumatic.  ?EYES: No scleral icterus ?MOUTH/THROAT: Moist oral membranes.  ?NECK: No JVD present. No thyromegaly noted. No carotid bruits  ?LYMPHATIC: No visible cervical adenopathy.  ?CHEST Normal respiratory effort. No intercostal retractions.  Left mastectomy ?LUNGS: Clear to auscultation bilaterally.  No stridor. No wheezes. No rales.  ?CARDIOVASCULAR: Irregularly irregular, variable S1-S2, tachycardia, no murmurs rubs or gallops appreciated secondary to tachycardia.   ?ABDOMINAL: Obese, soft, nontender, nondistended,  positive bowel sounds all 4 quadrants. No apparent ascites.  ?EXTREMITIES: No peripheral edema, warm to touch, +1 bilateral DP and PT pulses ?HEMATOLOGIC: No significant bruising ?NEUROLOGIC: Oriented to person

## 2021-07-26 NOTE — H&P (View-Only) (Signed)
? ?ID:  [name], DOB 09-Oct-1950, MRN [id number] ? ?PCP:  Shirline Frees, MD  ?Cardiologist:  Rex Kras, DO, [facility] (established care 06/28/21) ? ?Date: 07/26/2021 ?Last Office Visit: 06/28/2021 ? ?Chief Complaint  ?Patient presents with  ? Atrial Fibrillation  ? Follow-up  ? ? ?HPI  ?[name] is a 71 y.o. Caucasian female whose past medical history and cardiovascular risk factors include: Hx of breast cancer twice (index event lumpectomy/chemo/radiation, second episode left mastectomy), HTN, Atrial fibrillation, hypothyroidism, NIDDM Type II, RA, post menopausal, advance age, former smoker, aortic atherosclerosis.  ? ?She is referred to the office at the request of Shirline Frees, MD for evaluation of atrial fibrillation. ? ?Recently was diagnosed with shingles and towards the tail end of the viral infection had symptoms of palpitations.  She went back to her PCP for evaluation and was noted to be in atrial fibrillation, newly discovered.  At that time despite her rapid ventricular rate patient chose not to go to ED for evaluation and management and was started on diltiazem and referred to cardiology for further evaluation and management.  At last office visit we increased her diltiazem to 240 mg p.o. daily and her symptoms of palpitations have improved but intermittently more noticeable at times as well.  She has been on oral anticoagulation since last office visit 06/28/2021 without interruption per patient.  She does not endorse any evidence of bleeding. ? ?No prior history of intracranial bleeding.  She did have a history of duodenal ulcer which was surgically corrected per patient.  Since then no recurrence of gastrointestinal bleeding. ? ?FUNCTIONAL STATUS: ?No structured exercise program or daily routine.  ? ?ALLERGIES: ?Allergies  ?Allergen Reactions  ? Fish Allergy Anaphylaxis and Hives  ? Amaryl [Glimepiride] Itching  ? Avalide [Irbesartan-Hydrochlorothiazide] Itching  ? Avandia [Rosiglitazone]  Itching  ? Byetta 10 Mcg Pen [Exenatide] Itching  ? Codeine Itching  ?  Other reaction(s): Unknown  ? Demerol [Meperidine Hcl] Itching  ? Glucovance [Glyburide-Metformin] Itching  ? Iodinated Contrast Media Itching  ?  Other reaction(s): Unknown  ? Lisinopril Itching  ? Naprosyn [Naproxen] Hives  ? Penicillins Itching and Other (See Comments)  ?  Has patient had a PCN reaction causing immediate rash, facial/tongue/throat swelling, SOB or lightheadedness with hypotension: no ?Has patient had a PCN reaction causing severe rash involving mucus membranes or skin necrosis: no ?Has patient had a PCN reaction that required hospitalization: yes ?Has patient had a PCN reaction occurring within the last 10 years: no ?If all of the above answers are "NO", then may proceed with Cephalosporin use.  ? ? ?MEDICATION LIST PRIOR TO VISIT: ?Current Meds  ?Medication Sig  ? abatacept (ORENCIA) 250 MG injection Inject into the vein every 30 (thirty) days.  ? apixaban (ELIQUIS) 5 MG TABS tablet Take 1 tablet (5 mg total) by mouth 2 (two) times daily.  ? buPROPion (WELLBUTRIN XL) 150 MG 24 hr tablet Take 150 mg by mouth every morning.  ? denosumab (PROLIA) 60 MG/ML SOSY injection Inject 60 mg into the skin every 6 (six) months.  ? diltiazem (CARDIZEM CD) 300 MG 24 hr capsule Take 1 capsule (300 mg total) by mouth daily.  ? escitalopram (LEXAPRO) 10 MG tablet Take 10 mg by mouth daily.  ? esomeprazole (NEXIUM) 20 MG capsule Take 40 mg by mouth daily.   ? folic acid (FOLVITE) 1 MG tablet Take 2 mg by mouth daily.  ? HYDROcodone-acetaminophen (NORCO) 10-325 MG tablet Take 1 tablet by mouth every 6 (  six) hours as needed (For pain.).  ? hydrocortisone (ANUSOL-HC) 25 MG suppository Place 25 mg rectally 2 (two) times daily as needed for hemorrhoids.   ? hydroxychloroquine (PLAQUENIL) 200 MG tablet Take 400 mg by mouth daily.  ? levothyroxine (SYNTHROID) 175 MCG tablet Take 1 tablet by mouth daily.  ? metFORMIN (GLUCOPHAGE-XR) 500 MG 24 hr  tablet Take 4 tablets by mouth daily.  ? morphine (MS CONTIN) 30 MG 12 hr tablet Take 30 mg by mouth every 12 (twelve) hours.  ? Multiple Vitamin (MULTIVITAMIN WITH MINERALS) TABS tablet Take 1 tablet by mouth daily.  ? pravastatin (PRAVACHOL) 40 MG tablet Take 40 mg by mouth daily.  ? [DISCONTINUED] diltiazem (CARDIZEM CD) 240 MG 24 hr capsule Take 1 capsule (240 mg total) by mouth daily.  ? [DISCONTINUED] metoprolol succinate (TOPROL XL) 100 MG 24 hr tablet Take 1 tablet (100 mg total) by mouth daily. Take with or immediately following a meal.  ?  ? ?PAST MEDICAL HISTORY: ?Past Medical History:  ?Diagnosis Date  ? Asthma, mild   ? Intermittent  ? Atrial fibrillation ([place])   ? Feb 2023  ? Cancer [facility])   ? Chronic back pain   ? Echocardiogram abnormal 09/2008  ? With mild aortic valve and mitral valve regurgitation  ? Heart murmur   ? History of left breast cancer 1999  ? Hyperlipidemia   ? Hypertension   ? Hypothyroidism   ? IBS (irritable bowel syndrome)   ? Lactose intolerance   ? Osteoporosis   ? Peptic ulcer disease   ? Type II diabetes mellitus ([place])   ? [facility] 08-2012  ? Ulcer, duodenal, acute, with obstruction 1999  ? ? ?PAST SURGICAL HISTORY: ?Past Surgical History:  ?Procedure Laterality Date  ? ABDOMINAL HYSTERECTOMY    ? BREAST IMPLANT REMOVAL    ? Breast implant infected removed implant 06/06 infected and removed 11/05  ? BREAST LUMPECTOMY  09/99  ? Left breast  ? BREAST RECONSTRUCTION    ? CHOLECYSTECTOMY    ? CYSTOSCOPY W/ URETERAL STENT PLACEMENT Left 01/19/2018  ? Procedure: CYSTOSCOPY WITH RETROGRADE PYELOGRAM/URETERAL STENT PLACEMENT;  Surgeon: Franchot Gallo, MD;  [facility];  Service: Urology;  Laterality: Left;  ? EXTRACORPOREAL SHOCK WAVE LITHOTRIPSY Left 02/15/2018  ? Procedure: LEFT EXTRACORPOREAL SHOCK WAVE LITHOTRIPSY (ESWL);  Surgeon: Cleon Gustin, MD;  [facility]: [facility];  Service: Urology;  Laterality: Left;  ? MASTECTOMY  03/05  ? Left  ? other    ? Left Arm FX  ? OTHER  SURGICAL HISTORY    ? Hysterectomy- Ovaries intact   ? OTHER SURGICAL HISTORY    ? Fracture Left HIp  ? PARTIAL GASTRECTOMY  1999  ? ? ?FAMILY HISTORY: ?The patient family history includes Diabetes in her brother, father, and sister; Diabetes Mellitus II in her father; Heart attack in her father; Heart failure in her mother; Hypertension in her father; Osteoporosis in her mother; Thyroid cancer in her sister. ? ?SOCIAL HISTORY:  ?The patient  reports that she quit smoking about 21 years ago. Her smoking use included cigarettes. She has never used smokeless tobacco. She reports that she does not drink alcohol and does not use drugs. ? ?REVIEW OF SYSTEMS: ?Review of Systems  ?Constitutional: Positive for malaise/fatigue.  ?Cardiovascular:  Positive for dyspnea on exertion (improving) and irregular heartbeat. Negative for chest pain, leg swelling, near-syncope, orthopnea, palpitations, paroxysmal nocturnal dyspnea and syncope.  ?Respiratory:  Positive for shortness of breath (better).   ? ?PHYSICAL EXAM: ? ?  07/26/2021  ?  2:42 PM 06/28/2021  ?  2:20 PM 06/28/2021  ?  2:16 PM  ?Vitals with BMI  ?Height '5\' 3"'$   '5\' 3"'$   ?Weight 108 lbs  113 lbs  ?BMI 19.14  20.02  ?Systolic 326 712 458  ?Diastolic 88 83 88  ?Pulse 60 101 106  ? ? ?CONSTITUTIONAL: Well-developed and well-nourished. No acute distress.  ?SKIN: Skin is warm and dry. No rash noted. No cyanosis. No pallor. No jaundice ?HEAD: Normocephalic and atraumatic.  ?EYES: No scleral icterus ?MOUTH/THROAT: Moist oral membranes.  ?NECK: No JVD present. No thyromegaly noted. No carotid bruits  ?LYMPHATIC: No visible cervical adenopathy.  ?CHEST Normal respiratory effort. No intercostal retractions.  Left mastectomy ?LUNGS: Clear to auscultation bilaterally.  No stridor. No wheezes. No rales.  ?CARDIOVASCULAR: Irregularly irregular, variable S1-S2, tachycardia, no murmurs rubs or gallops appreciated secondary to tachycardia.   ?ABDOMINAL: Obese, soft, nontender, nondistended,  positive bowel sounds all 4 quadrants. No apparent ascites.  ?EXTREMITIES: No peripheral edema, warm to touch, +1 bilateral DP and PT pulses ?HEMATOLOGIC: No significant bruising ?NEUROLOGIC: Oriented to person

## 2021-07-30 ENCOUNTER — Telehealth: Payer: Self-pay

## 2021-07-30 NOTE — Telephone Encounter (Signed)
Pt called an dstated that you had put her on Metoprolol last office visit. She said she has been sleeping much more often and has felt very out of it since starting this medication. She is SOB more often than she was previously. Her BP while on the phone was 135/100 HR 109. Pt has not checked her BP since starting this medication because she was to tired. I advised pt to try and keep track of her BP. She is unsure of what to do, please advise.  ?

## 2021-08-01 ENCOUNTER — Other Ambulatory Visit: Payer: Self-pay | Admitting: Cardiology

## 2021-08-01 DIAGNOSIS — I4819 Other persistent atrial fibrillation: Secondary | ICD-10-CM

## 2021-08-01 MED ORDER — DILTIAZEM HCL ER COATED BEADS 300 MG PO CP24: 300.0000 mg | ORAL_CAPSULE | Freq: Every day | ORAL | 0 refills | Status: DC

## 2021-08-03 ENCOUNTER — Encounter (HOSPITAL_COMMUNITY): Payer: Self-pay | Admitting: Cardiology

## 2021-08-03 NOTE — Telephone Encounter (Signed)
Spoke to the patient on 3.31. 2023.  ?See the last office note.  ?Increased the dose of Diltiazem..  ? ?Rex Kras, DO, FACC ?

## 2021-08-06 ENCOUNTER — Other Ambulatory Visit: Payer: Self-pay

## 2021-08-06 ENCOUNTER — Emergency Department (HOSPITAL_COMMUNITY): Payer: Medicare Other

## 2021-08-06 ENCOUNTER — Emergency Department (HOSPITAL_COMMUNITY)
Admission: EM | Admit: 2021-08-06 | Discharge: 2021-08-06 | Disposition: A | Discharge status: Home / Self Care | Payer: Medicare Other | Attending: Emergency Medicine | Admitting: Emergency Medicine

## 2021-08-06 DIAGNOSIS — Z7901 Long term (current) use of anticoagulants: Secondary | ICD-10-CM | POA: Insufficient documentation

## 2021-08-06 DIAGNOSIS — R0902 Hypoxemia: Secondary | ICD-10-CM | POA: Diagnosis not present

## 2021-08-06 DIAGNOSIS — W1843XA Slipping, tripping and stumbling without falling due to stepping from one level to another, initial encounter: Secondary | ICD-10-CM | POA: Insufficient documentation

## 2021-08-06 DIAGNOSIS — I10 Essential (primary) hypertension: Secondary | ICD-10-CM | POA: Diagnosis not present

## 2021-08-06 DIAGNOSIS — M542 Cervicalgia: Secondary | ICD-10-CM | POA: Insufficient documentation

## 2021-08-06 DIAGNOSIS — J90 Pleural effusion, not elsewhere classified: Secondary | ICD-10-CM | POA: Diagnosis not present

## 2021-08-06 DIAGNOSIS — S0990XA Unspecified injury of head, initial encounter: Secondary | ICD-10-CM | POA: Diagnosis not present

## 2021-08-06 DIAGNOSIS — S12600A Unspecified displaced fracture of seventh cervical vertebra, initial encounter for closed fracture: Secondary | ICD-10-CM | POA: Diagnosis not present

## 2021-08-06 DIAGNOSIS — W19XXXA Unspecified fall, initial encounter: Secondary | ICD-10-CM | POA: Diagnosis not present

## 2021-08-06 DIAGNOSIS — I4891 Unspecified atrial fibrillation: Secondary | ICD-10-CM | POA: Diagnosis not present

## 2021-08-06 DIAGNOSIS — M47812 Spondylosis without myelopathy or radiculopathy, cervical region: Secondary | ICD-10-CM | POA: Diagnosis not present

## 2021-08-06 NOTE — ED Notes (Signed)
Pt satting at 87% on RA. RN placed pt on 2L  ?

## 2021-08-06 NOTE — ED Notes (Addendum)
Educated patient on importance of c-collar prior to imaging, patient refused collar at this time. ?

## 2021-08-06 NOTE — ED Triage Notes (Signed)
Pt BIB EMS due to a fall from home. Pt tripped over 2 steps. Pt takes elliquis for afib. Py is axox4. VSS. No visual head trauma.  ?

## 2021-08-06 NOTE — ED Notes (Signed)
Trauma end  

## 2021-08-06 NOTE — ED Notes (Signed)
Trauma Response Nurse Documentation ? ? ?[name] is a 71 y.o. female arriving to [facility] ED via EMS ? ?On Eliquis (apixaban) daily. Trauma was activated as a Level 2 based on the following trauma criteria Elderly patients > 65 with head trauma on anti-coagulation (excluding ASA). Trauma team at the bedside on patient arrival. Patient cleared for CT by Dr. Dina Rich. Patient to CT with team. GCS 15. ? ?History  ? Past Medical History:  ?Diagnosis Date  ? Asthma, mild   ? Intermittent  ? Atrial fibrillation ([place])   ? Feb 2023  ? Cancer [facility])   ? Chronic back pain   ? Echocardiogram abnormal 09/2008  ? With mild aortic valve and mitral valve regurgitation  ? Heart murmur   ? History of left breast cancer 1999  ? Hyperlipidemia   ? Hypertension   ? Hypothyroidism   ? IBS (irritable bowel syndrome)   ? Lactose intolerance   ? Osteoporosis   ? Peptic ulcer disease   ? Type II diabetes mellitus ([place])   ? [facility] 08-2012  ? Ulcer, duodenal, acute, with obstruction 1999  ?  ? Past Surgical History:  ?Procedure Laterality Date  ? ABDOMINAL HYSTERECTOMY    ? BREAST IMPLANT REMOVAL    ? Breast implant infected removed implant 06/06 infected and removed 11/05  ? BREAST LUMPECTOMY  09/99  ? Left breast  ? BREAST RECONSTRUCTION    ? CHOLECYSTECTOMY    ? CYSTOSCOPY W/ URETERAL STENT PLACEMENT Left 01/19/2018  ? Procedure: CYSTOSCOPY WITH RETROGRADE PYELOGRAM/URETERAL STENT PLACEMENT;  Surgeon: Franchot Gallo, MD;  [facility];  Service: Urology;  Laterality: Left;  ? EXTRACORPOREAL SHOCK WAVE LITHOTRIPSY Left 02/15/2018  ? Procedure: LEFT EXTRACORPOREAL SHOCK WAVE LITHOTRIPSY (ESWL);  Surgeon: Cleon Gustin, MD;  [facility];  Service: Urology;  Laterality: Left;  ? MASTECTOMY  03/05  ? Left  ? other    ? Left Arm FX  ? OTHER SURGICAL HISTORY    ? Hysterectomy- Ovaries intact   ? OTHER SURGICAL HISTORY    ? Fracture Left HIp  ? PARTIAL GASTRECTOMY  1999  ?  ? ?Initial Focused Assessment (If applicable, or  please see trauma documentation): ?Per patient she was walking up steps, her flip flop got caught causing her to fall backward. She was unable to catch herself and fell hitting her upper back and head on soft ground.  ? ?CT's Completed:   ?CT Head and CT C-Spine  ? ?Interventions:  ?CT Head/Cspine ?No other imaging/labs, mechanical fall per patient.  ? ?Plan for disposition:  ?Discharge home  ? ?Consults completed:  ?Neurosurgeon at due to T1 endplate fracture - per NSG ok to discharge home. ? ?Event Summary: ?Patient discharged ? ?Bedside handoff with ED RN [place].   ? ?[place] Dylann Gallier  ?Trauma Response RN ? ?Please call TRN at [phone] for further assistance. ?  ?

## 2021-08-06 NOTE — ED Provider Notes (Addendum)
?[facility] ?Provider Note ? ? ?CSN: [id number] ?Arrival date & time: 08/06/21  1349 ? ?  ? ?History ? ?Chief Complaint  ?Patient presents with  ? Fall  ? ? ?[name] is a 71 y.o. female. ? ?HPI ? ?71 year old female with past medical history of atrial fibrillation anticoagulant on Eliquis presents emergency department as a level 2 trauma for fall with head injury on blood thinners.  Patient was going up her steps when her flip-flop got caught and she fell back and hit the back part of her head.  No loss of consciousness.  She is currently complaining of posterior head and mild neck pain.  Refused c-collar in route.  Denies any other traumatic injury.  States she has been compliant with her Eliquis but her last dose was last night, she did not get her morning dose.  She is otherwise been in her baseline health with no recent illness. ? ?Home Medications ?Prior to Admission medications   ?Medication Sig Start Date End Date Taking? Authorizing Provider  ?abatacept (ORENCIA) 250 MG injection Inject into the vein every 30 (thirty) days.    [provider]  ?apixaban (ELIQUIS) 5 MG TABS tablet Take 1 tablet (5 mg total) by mouth 2 (two) times daily. 06/28/21 09/26/21  Tolia, Sunit, DO  ?buPROPion (WELLBUTRIN XL) 150 MG 24 hr tablet Take 150 mg by mouth every morning. 01/31/20   [provider]  ?denosumab (PROLIA) 60 MG/ML SOSY injection Inject 60 mg into the skin every 6 (six) months.    [provider]  ?diltiazem (CARDIZEM CD) 300 MG 24 hr capsule Take 1 capsule (300 mg total) by mouth daily. 08/01/21 10/30/21  Tolia, Sunit, DO  ?escitalopram (LEXAPRO) 10 MG tablet Take 10 mg by mouth daily.    [provider]  ?esomeprazole (NEXIUM) 20 MG capsule Take 40 mg by mouth daily.     [provider]  ?folic acid (FOLVITE) 1 MG tablet Take 2 mg by mouth daily.    [provider]  ?HYDROcodone-acetaminophen (NORCO) 10-325 MG tablet  Take 1 tablet by mouth every 6 (six) hours as needed (For pain.).    [provider]  ?hydrocortisone (ANUSOL-HC) 25 MG suppository Place 25 mg rectally 2 (two) times daily as needed for hemorrhoids.     [provider]  ?hydroxychloroquine (PLAQUENIL) 200 MG tablet Take 400 mg by mouth daily.    [provider]  ?levothyroxine (SYNTHROID) 175 MCG tablet Take 1 tablet by mouth daily. 03/18/21   [provider]  ?metFORMIN (GLUCOPHAGE-XR) 500 MG 24 hr tablet Take 4 tablets by mouth daily.    [provider]  ?morphine (MS CONTIN) 30 MG 12 hr tablet Take 30 mg by mouth every 12 (twelve) hours. 07/06/20   [provider]  ?Multiple Vitamin (MULTIVITAMIN WITH MINERALS) TABS tablet Take 1 tablet by mouth daily.    [provider]  ?pravastatin (PRAVACHOL) 40 MG tablet Take 40 mg by mouth daily.    [provider]  ?   ? ?Allergies    ?Fish allergy, Amaryl [glimepiride], Avalide [irbesartan-hydrochlorothiazide], Avandia [rosiglitazone], Byetta 10 mcg pen [exenatide], Codeine, Demerol [meperidine hcl], Glucovance [glyburide-metformin], Iodinated contrast media, Lisinopril, Naprosyn [naproxen], and Penicillins   ? ?Review of Systems   ?Review of Systems  ?Constitutional:  Negative for fever.  ?Respiratory:  Negative for shortness of breath.   ?Cardiovascular:  Negative for chest pain.  ?Gastrointestinal:  Negative for abdominal pain.  ?Musculoskeletal:  Positive for neck pain. Negative for back pain.  ?Skin:  Negative for rash.  ?Neurological:  Positive for headaches. Negative for weakness and numbness.  ? ?Physical Exam ?Updated Vital Signs ?BP (!) 166/81   Pulse 97   Temp 97.7 ?F (36.5 ?C) (Oral)   Resp 18   Ht '5\' 3"'$  (1.6 m)   Wt 49 kg   SpO2 91%   BMI 19.13 kg/m?  ?Physical Exam ?Vitals and nursing note reviewed.  ?Constitutional:   ?   General: She is not in acute distress. ?   Appearance: Normal appearance.  ?HENT:  ?   Head: Normocephalic.  ?    Comments: No large hematoma or wound ?   Right Ear: External ear normal.  ?   Left Ear: External ear normal.  ?   Mouth/Throat:  ?   Mouth: Mucous membranes are moist.  ?Eyes:  ?   Extraocular Movements: Extraocular movements intact.  ?   Pupils: Pupils are equal, round, and reactive to light.  ?Neck:  ?   Comments: Bilateral paraspinal/trapezius muscular tenderness, no midline spinal tenderness ?Cardiovascular:  ?   Rate and Rhythm: Normal rate.  ?Pulmonary:  ?   Effort: Pulmonary effort is normal. No respiratory distress.  ?Abdominal:  ?   Palpations: Abdomen is soft.  ?   Tenderness: There is no abdominal tenderness.  ?Musculoskeletal:     ?   General: No swelling or deformity.  ?Skin: ?   General: Skin is warm.  ?Neurological:  ?   Mental Status: She is alert and oriented to person, place, and time. Mental status is at baseline.  ?Psychiatric:     ?   Mood and Affect: Mood normal.  ? ? ?ED Results / Procedures / Treatments   ?Labs ?(all labs ordered are listed, but only abnormal results are displayed) ?Labs Reviewed - No data to display ? ?EKG ?None ? ?Radiology ?No results found. ? ?Procedures ?Marland KitchenCritical Care ?Performed by: Lorelle Gibbs, DO ?Authorized by: Lorelle Gibbs, DO  ? ?Critical care provider statement:  ?  Critical care time (minutes):  30 ?  Critical care time was exclusive of:  Separately billable procedures and treating other patients ?  Critical care was necessary to treat or prevent imminent or life-threatening deterioration of the following conditions:  Trauma ?  Critical care was time spent personally by me on the following activities:  Development of treatment plan with patient or surrogate, discussions with consultants, evaluation of patient's response to treatment, examination of patient, ordering and review of laboratory studies, ordering and review of radiographic studies, ordering and performing treatments and interventions, pulse oximetry, re-evaluation of patient's condition  and review of old charts  ? ? ?Medications Ordered in ED ?Medications - No data to display ? ?ED Course/ Medical Decision Making/ A&P ?  ?                        ?Medical Decision Making ?Amount and/or Complexity of Data Reviewed ?Radiology: ordered. ? ? ?71 year old female presents emergency department as a level 2 trauma, mechanical fall with head injury on blood thinners.  She takes Eliquis.  Last dose was last night.  Vitals are stable on arrival.  She is complaining of posterior head and neck pain.  Denies any other traumatic injury.  EKG shows atrial fibrillation which is known for the patient.  HR vary from 90-110. No CP or SOB, lungs clear. She has stable blood pressure. ? ?  Plan for CT head/cervical spine. Mechanical fall with no systemic complaint, no indication for lab evaluation.  ? ?Patient had an episode of hypoxia that nursing staff placed her on 2 L of nasal cannula for. This was brief and she was easily weaned from.  She was awake and comfortable the entire time, no respiratory distress.  Unclear if this was genuine. ? ?CT of head is unremarkable besides findings in sinus, discussed.  CT cervical spine shows questionable superior endplate fractures at C7, T1. Patient is tender in this area but paraspinally. Consulted with neurosurgery, Dr. Arnoldo Morale.  We do not believe that the imaging is definitive at this time but still recommends c-collar and outpatient follow-up.  Patient is refusing to wear c-collar.  She understands the risks associated with this in the event that there is a cervical spine fracture.  She has capacity to make this decision, is alert and oriented.  She will plan to follow-up as an outpatient. ? ?Patient at this time appears safe and stable for discharge and close outpatient follow up. Discharge plan and strict return to ED precautions discussed, patient verbalizes understanding and agreement. ? ? ?Final Clinical Impression(s) / ED Diagnoses ?Final diagnoses:  ?None  ? ? ?Rx / DC  Orders ?ED Discharge Orders   ? ? None  ? ?  ? ? ?  ?Lorelle Gibbs, DO ?08/06/21 1545 ? ?  ?Lorelle Gibbs, DO ?08/06/21 1549 ? ?

## 2021-08-06 NOTE — ED Notes (Addendum)
Trauma start 1348 ?

## 2021-08-06 NOTE — Progress Notes (Signed)
Orthopedic Tech Progress Note ?Patient Details:  ?CHAMILLE WERNTZ ?Jan 12, 1951 ?[id number] ? ?Level 2 trauma  ? ?Patient ID: LARONDA LISBY, female   DOB: [birth year], 71 y.o.   MRN: [id number] ? ?Janit Pagan ?08/06/2021, 2:16 PM ? ?

## 2021-08-06 NOTE — Discharge Instructions (Signed)
You have been seen and discharged from the emergency department.  CT of your head showed no bleed.  CT of your cervical spine showed questionable fracture around C7.  Neurosurgery reviewed this.  Recommend c-collar which she refused and outpatient follow-up in their office.  Please refer to contact information in this packet.  Follow-up with your primary provider for further evaluation and further care. Take home medications as prescribed. If you have any worsening symptoms or further concerns for your health please return to an emergency department for further evaluation. ?

## 2021-08-11 ENCOUNTER — Encounter (HOSPITAL_COMMUNITY): Payer: Self-pay | Admitting: Cardiology

## 2021-08-11 ENCOUNTER — Other Ambulatory Visit: Payer: Self-pay

## 2021-08-11 ENCOUNTER — Ambulatory Visit (HOSPITAL_COMMUNITY): Payer: Medicare Other | Admitting: Anesthesiology

## 2021-08-11 ENCOUNTER — Ambulatory Visit (HOSPITAL_COMMUNITY)
Admission: RE | Admit: 2021-08-11 | Discharge: 2021-08-11 | Disposition: A | Discharge status: Home / Self Care | Payer: Medicare Other | Source: Ambulatory Visit | Attending: Cardiology | Admitting: Cardiology

## 2021-08-11 ENCOUNTER — Encounter (HOSPITAL_COMMUNITY)
Admission: RE | Disposition: A | Discharge status: Home / Self Care | Payer: Self-pay | Source: Ambulatory Visit | Attending: Cardiology

## 2021-08-11 ENCOUNTER — Ambulatory Visit (HOSPITAL_BASED_OUTPATIENT_CLINIC_OR_DEPARTMENT_OTHER): Payer: Medicare Other | Admitting: Anesthesiology

## 2021-08-11 DIAGNOSIS — I10 Essential (primary) hypertension: Secondary | ICD-10-CM

## 2021-08-11 DIAGNOSIS — R0602 Shortness of breath: Secondary | ICD-10-CM | POA: Insufficient documentation

## 2021-08-11 DIAGNOSIS — Z87891 Personal history of nicotine dependence: Secondary | ICD-10-CM | POA: Diagnosis not present

## 2021-08-11 DIAGNOSIS — I4891 Unspecified atrial fibrillation: Secondary | ICD-10-CM | POA: Diagnosis not present

## 2021-08-11 DIAGNOSIS — E039 Hypothyroidism, unspecified: Secondary | ICD-10-CM | POA: Diagnosis not present

## 2021-08-11 DIAGNOSIS — Z7901 Long term (current) use of anticoagulants: Secondary | ICD-10-CM | POA: Diagnosis not present

## 2021-08-11 DIAGNOSIS — Z7984 Long term (current) use of oral hypoglycemic drugs: Secondary | ICD-10-CM | POA: Diagnosis not present

## 2021-08-11 DIAGNOSIS — E119 Type 2 diabetes mellitus without complications: Secondary | ICD-10-CM | POA: Diagnosis not present

## 2021-08-11 DIAGNOSIS — I011 Acute rheumatic endocarditis: Secondary | ICD-10-CM | POA: Diagnosis not present

## 2021-08-11 DIAGNOSIS — I700 Atherosclerosis of aorta: Secondary | ICD-10-CM | POA: Insufficient documentation

## 2021-08-11 DIAGNOSIS — I4819 Other persistent atrial fibrillation: Secondary | ICD-10-CM | POA: Insufficient documentation

## 2021-08-11 HISTORY — PX: CARDIOVERSION: SHX1299

## 2021-08-11 LAB — POCT I-STAT, CHEM 8
BUN: 13 mg/dL (ref 8–23)
Calcium, Ion: 1.01 mmol/L — ABNORMAL LOW (ref 1.15–1.40)
Chloride: 95 mmol/L — ABNORMAL LOW (ref 98–111)
Creatinine, Ser: 0.4 mg/dL — ABNORMAL LOW (ref 0.44–1.00)
Glucose, Bld: 199 mg/dL — ABNORMAL HIGH (ref 70–99)
HCT: 40 % (ref 36.0–46.0)
Hemoglobin: 13.6 g/dL (ref 12.0–15.0)
Potassium: 3.8 mmol/L (ref 3.5–5.1)
Sodium: 138 mmol/L (ref 135–145)
TCO2: 32 mmol/L (ref 22–32)

## 2021-08-11 SURGERY — CARDIOVERSION
Anesthesia: General

## 2021-08-11 MED ORDER — HYDRALAZINE HCL 20 MG/ML IJ SOLN
10.0000 mg | Freq: Once | INTRAMUSCULAR | Status: DC
Start: 2021-08-11 — End: 2021-08-11

## 2021-08-11 MED ORDER — SODIUM CHLORIDE 0.9 % IV SOLN: INTRAVENOUS | Status: DC

## 2021-08-11 MED ORDER — LOSARTAN POTASSIUM 25 MG PO TABS: 25.0000 mg | ORAL_TABLET | Freq: Every day | ORAL | 0 refills | Status: DC

## 2021-08-11 MED ORDER — LIDOCAINE 2% (20 MG/ML) 5 ML SYRINGE
INTRAMUSCULAR | Status: DC | PRN
  Administered 2021-08-11: 40 mg via INTRAVENOUS

## 2021-08-11 MED ORDER — PROPOFOL 10 MG/ML IV BOLUS
INTRAVENOUS | Status: DC | PRN
  Administered 2021-08-11: 40 mg via INTRAVENOUS

## 2021-08-11 MED ORDER — PHENYLEPHRINE HCL (PRESSORS) 10 MG/ML IV SOLN
INTRAVENOUS | Status: DC | PRN
  Administered 2021-08-11: 80 ug via INTRAVENOUS

## 2021-08-11 MED ORDER — HYDRALAZINE HCL 20 MG/ML IJ SOLN
INTRAMUSCULAR | Status: AC
  Filled 2021-08-11: qty 1

## 2021-08-11 NOTE — CV Procedure (Signed)
Direct current cardioversion: ? ?Indications:  Atrial Fibrillation ? ?Procedure Details: ? ?Consent: Risks of procedure as well as the alternatives and risks of each were explained to the (patient/caregiver).  Consent for procedure obtained. ? ?Time Out: Verified patient identification, verified procedure, site/side was marked, verified correct patient position, special equipment/implants available, medications/allergies/relevent history reviewed, required imaging and test results available. PERFORMED. ? ?Patient placed on cardiac monitor, pulse oximetry, supplemental oxygen as necessary.  ?Sedation given:  See anesthesia records. ?Pacer pads placed anterior and posterior chest. ? ?Cardioverted 1 time(s).  ?Cardioversion with synchronized biphasic 150J shock. ? ?Evaluation: ?Findings: Post procedure EKG shows: NSR ?Complications: None ?Patient did tolerate procedure well. ? ?Spoke to the patient's son Mali over the phone at [phone].  Same medications were resumed postdischarge.  With addition of antihypertensive medications.  I would like to see her back in 2 weeks in the office for reevaluation EKG on arrival ? ?Alona Danford [place], DO, FACC ? ?Pager: [phone] ?Office: [phone] ?08/11/2021, 1:20 PM ? ?

## 2021-08-11 NOTE — Anesthesia Preprocedure Evaluation (Signed)
Anesthesia Evaluation  ?Patient identified by MRN, date of birth, ID band ?Patient awake ? ? ? ?Reviewed: ?Allergy & Precautions, NPO status , Patient's Chart, lab work & pertinent test results ? ?Airway ?Mallampati: II ? ?TM Distance: >3 FB ?Neck ROM: Full ? ? ? Dental ? ?(+) Poor Dentition, Missing, Dental Advisory Given ?  ?Pulmonary ?asthma , former smoker,  ?  ?Pulmonary exam normal ?breath sounds clear to auscultation ? ? ? ? ? ? Cardiovascular ?hypertension, Pt. on medications ?+ dysrhythmias (eliquis) Atrial Fibrillation  ?Rhythm:Irregular Rate:Normal ? ? ?  ?Neuro/Psych ?negative neurological ROS ? negative psych ROS  ? GI/Hepatic ?Neg liver ROS, PUD,   ?Endo/Other  ?diabetes, Well Controlled, Type 2, Oral Hypoglycemic AgentsHypothyroidism  ? Renal/GU ?negative Renal ROS  ?negative genitourinary ?  ?Musculoskeletal ?negative musculoskeletal ROS ?(+)  ? Abdominal ?  ?Peds ?negative pediatric ROS ?(+)  Hematology ?negative hematology ROS ?(+)   ?Anesthesia Other Findings ? ? Reproductive/Obstetrics ?negative OB ROS ? ?  ? ? ? ? ? ? ? ? ? ? ? ? ? ?  ?  ? ? ? ? ? ? ? ? ?Anesthesia Physical ?Anesthesia Plan ? ?ASA: 3 ? ?Anesthesia Plan: General  ? ?Post-op Pain Management:   ? ?Induction: Intravenous ? ?PONV Risk Score and Plan: TIVA and Treatment may vary due to age or medical condition ? ?Airway Management Planned: Natural Airway and Mask ? ?Additional Equipment: None ? ?Intra-op Plan:  ? ?Post-operative Plan:  ? ?Informed Consent: I have reviewed the patients History and Physical, chart, labs and discussed the procedure including the risks, benefits and alternatives for the proposed anesthesia with the patient or authorized representative who has indicated his/her understanding and acceptance.  ? ? ? ?Dental advisory given ? ?Plan Discussed with: CRNA ? ?Anesthesia Plan Comments:   ? ? ? ? ? ? ?Anesthesia Quick Evaluation ? ?

## 2021-08-11 NOTE — Anesthesia Postprocedure Evaluation (Signed)
Anesthesia Post Note ? ?Patient: KARISHA MARLIN ? ?Procedure(s) Performed: CARDIOVERSION ? ?  ? ?Patient location during evaluation: PACU ?Anesthesia Type: General ?Level of consciousness: awake and alert, oriented and patient cooperative ?Pain management: pain level controlled ?Vital Signs Assessment: post-procedure vital signs reviewed and stable ?Respiratory status: spontaneous breathing, nonlabored ventilation and respiratory function stable ?Cardiovascular status: blood pressure returned to baseline and stable ?Postop Assessment: no apparent nausea or vomiting ?Anesthetic complications: no ? ? ?No notable events documented. ? ?Last Vitals:  ?Vitals:  ? 08/11/21 1347 08/11/21 1357  ?BP: (!) 151/87 (!) 151/67  ?Pulse: 95 94  ?Resp: 15 11  ?Temp:    ?SpO2: 94% 93%  ?  ?Last Pain:  ?Vitals:  ? 08/11/21 1357  ?TempSrc:   ?PainSc: 0-No pain  ? ? ?  ?  ?  ?  ?  ?  ? ?Jarome Matin Krishan Mcbreen ? ? ? ? ?

## 2021-08-11 NOTE — Discharge Instructions (Signed)

## 2021-08-11 NOTE — Transfer of Care (Signed)
Immediate Anesthesia Transfer of Care Note ? ?Patient: [name] ? ?Procedure(s) Performed: CARDIOVERSION ? ?Patient Location: Endoscopy Unit ? ?Anesthesia Type:General ? ?Level of Consciousness: awake and alert  ? ?Airway & Oxygen Therapy: Patient Spontanous Breathing ? ?Post-op Assessment: Report given to RN and Post -op Vital signs reviewed and stable ? ?Post vital signs: Reviewed and stable ? ?Last Vitals:  ?Vitals Value Taken Time  ?BP 177/78   ?Temp    ?Pulse 84   ?Resp 18   ?SpO2 99   ? ? ?Last Pain:  ?Vitals:  ? 08/11/21 1150  ?TempSrc: Temporal  ?PainSc: 0-No pain  ?   ? ?  ? ?Complications: No notable events documented. ?

## 2021-08-11 NOTE — Interval H&P Note (Signed)
History and Physical Interval Note: ? ?08/11/2021 ?12:54 PM ? ?[name]  has presented today for surgery, with the diagnosis of AFIB.  The various methods of treatment have been discussed with the patient and family. After consideration of risks, benefits and other options for treatment, the patient has consented to  Procedure(s): ?CARDIOVERSION (N/A) as a surgical intervention.  The patient's history has been reviewed, patient examined, no change in status, stable for surgery.  I have reviewed the patient's chart and labs.  Questions were answered to the patient's satisfaction.   ? ?Risks, benefits, and alternatives of direct current cardioversion reviewed with the and patient. Risk includes but not limited to: potential for post-cardioversion rhythms, life-threatening arrhythmias (ventricular tachycardia and fibrillation, profound bradycardia, cardiac arrest needing cardiopulmonary resuscitation (CPR)), myocardial damage, acute pulmonary edema, skin burns, transient hypotension. Benefits include restoration of sinus rhythm. Alternatives to treatment were discussed, questions were answered, patient voices understanding and provides verbal feedback.  Patient is willing to proceed.  ? ?Reviewed the documentation of her recent ER visit.  ? ?Patient provides verbal confirmation that she has not missed doses of Eliquis over the last 4 weeks. She has refused TEE guided DCCV in the past. ? ?Her son Mali Howell is here with her who can be contacted at [phone] ? ?Asani Mcburney [place], DO, FACC ? ?Pager: [phone] ?Office: [phone] ? ?

## 2021-08-12 ENCOUNTER — Encounter (HOSPITAL_COMMUNITY): Payer: Self-pay | Admitting: Cardiology

## 2021-08-23 ENCOUNTER — Encounter: Payer: Self-pay | Admitting: Cardiology

## 2021-08-23 ENCOUNTER — Ambulatory Visit: Payer: Medicare Other | Admitting: Cardiology

## 2021-08-23 VITALS — BP 144/74 | HR 88 | Temp 98.0°F | Resp 16 | Ht 63.0 in | Wt 108.0 lb

## 2021-08-23 DIAGNOSIS — I700 Atherosclerosis of aorta: Secondary | ICD-10-CM | POA: Diagnosis not present

## 2021-08-23 DIAGNOSIS — I480 Paroxysmal atrial fibrillation: Secondary | ICD-10-CM

## 2021-08-23 DIAGNOSIS — R0602 Shortness of breath: Secondary | ICD-10-CM

## 2021-08-23 DIAGNOSIS — Z7901 Long term (current) use of anticoagulants: Secondary | ICD-10-CM | POA: Diagnosis not present

## 2021-08-23 DIAGNOSIS — I011 Acute rheumatic endocarditis: Secondary | ICD-10-CM

## 2021-08-23 DIAGNOSIS — I10 Essential (primary) hypertension: Secondary | ICD-10-CM

## 2021-08-23 DIAGNOSIS — E119 Type 2 diabetes mellitus without complications: Secondary | ICD-10-CM | POA: Diagnosis not present

## 2021-08-23 MED ORDER — HYDROCHLOROTHIAZIDE 25 MG PO TABS: 25.0000 mg | ORAL_TABLET | Freq: Every morning | ORAL | 0 refills | Status: DC

## 2021-08-23 NOTE — Progress Notes (Signed)
? ?ID:  [name], DOB Oct 06, 1950, MRN [id number] ? ?PCP:  Shirline Frees, MD  ?Cardiologist:  Rex Kras, DO, [facility] (established care 06/28/21) ? ?Date: 07/26/2021 ?Last Office Visit: 06/28/2021 ? ?Chief Complaint  ?Patient presents with  ? s/p cardioversion  ? Follow-up  ?   4 weeks  ? ? ?HPI  ?[name] is a 71 y.o. Caucasian female whose past medical history and cardiovascular risk factors include: Hx of breast cancer twice (index event lumpectomy/chemo/radiation, second episode left mastectomy), HTN, Atrial fibrillation, hypothyroidism, NIDDM Type II, RA, post menopausal, advance age, former smoker, aortic atherosclerosis.  ? ?She is referred to the office at the request of Shirline Frees, MD for evaluation of atrial fibrillation. ? ?Patient recently had an episode of shingles and towards the end of the infection she was having persistent episodes of palpitations.  She followed up with PCP and was diagnosed with atrial fibrillation.  Despite being on AV nodal blocking agents and oral anticoagulation for 4 weeks patient remained in symptomatic atrial fibrillation and underwent elective direct-current cardioversion since last office visit.  Patient was converted to normal sinus rhythm with 150 J x 1 and now presents for 2-week follow-up. ? ?Patient remains in normal sinus rhythm and states that her shortness of breath is much improved but has started noticing lower extremity swelling bilaterally predominantly located in her feet which do not improve by morning hours.  She denies orthopnea or paroxysmal nocturnal dyspnea. ? ?No prior history of intracranial bleeding.  She did have a history of duodenal ulcer which was surgically corrected per patient.  Since then no recurrence of gastrointestinal bleeding. ? ?FUNCTIONAL STATUS: ?No structured exercise program or daily routine.  ? ?ALLERGIES: ?Allergies  ?Allergen Reactions  ? Fish Allergy Anaphylaxis and Hives  ? Amaryl [Glimepiride] Itching  ?  Avalide [Irbesartan-Hydrochlorothiazide] Itching  ? Avandia [Rosiglitazone] Itching  ? Byetta 10 Mcg Pen [Exenatide] Itching  ? Codeine Itching  ?  Other reaction(s): Unknown  ? Demerol [Meperidine Hcl] Itching  ? Glucovance [Glyburide-Metformin] Itching  ? Iodinated Contrast Media Itching  ?  Other reaction(s): Unknown  ? Lisinopril Itching  ? Naprosyn [Naproxen] Hives  ? Penicillins Itching and Other (See Comments)  ?  Has patient had a PCN reaction causing immediate rash, facial/tongue/throat swelling, SOB or lightheadedness with hypotension: no ?Has patient had a PCN reaction causing severe rash involving mucus membranes or skin necrosis: no ?Has patient had a PCN reaction that required hospitalization: yes ?Has patient had a PCN reaction occurring within the last 10 years: no ?If all of the above answers are "NO", then may proceed with Cephalosporin use.  ? ? ?MEDICATION LIST PRIOR TO VISIT: ?Current Meds  ?Medication Sig  ? abatacept (ORENCIA) 250 MG injection Inject into the vein every 30 (thirty) days.  ? apixaban (ELIQUIS) 5 MG TABS tablet Take 1 tablet (5 mg total) by mouth 2 (two) times daily.  ? buPROPion (WELLBUTRIN XL) 150 MG 24 hr tablet Take 150 mg by mouth every morning.  ? denosumab (PROLIA) 60 MG/ML SOSY injection Inject 60 mg into the skin every 6 (six) months.  ? diltiazem (CARDIZEM CD) 300 MG 24 hr capsule Take 1 capsule (300 mg total) by mouth daily.  ? escitalopram (LEXAPRO) 10 MG tablet Take 10 mg by mouth daily.  ? esomeprazole (NEXIUM) 20 MG capsule Take 40 mg by mouth daily.   ? folic acid (FOLVITE) 1 MG tablet Take 2 mg by mouth daily.  ? hydrochlorothiazide (HYDRODIURIL) 25  MG tablet Take 1 tablet (25 mg total) by mouth every morning.  ? HYDROcodone-acetaminophen (NORCO) 10-325 MG tablet Take 1 tablet by mouth every 6 (six) hours as needed (For pain.).  ? hydrocortisone (ANUSOL-HC) 25 MG suppository Place 25 mg rectally 2 (two) times daily as needed for hemorrhoids.   ?  hydroxychloroquine (PLAQUENIL) 200 MG tablet Take 200 mg by mouth daily.  ? levothyroxine (SYNTHROID) 175 MCG tablet Take 1 tablet by mouth daily.  ? losartan (COZAAR) 25 MG tablet Take 1 tablet (25 mg total) by mouth daily.  ? metFORMIN (GLUCOPHAGE-XR) 500 MG 24 hr tablet Take 4 tablets by mouth daily.  ? morphine (MS CONTIN) 30 MG 12 hr tablet Take 30 mg by mouth every 12 (twelve) hours.  ? Multiple Vitamin (MULTIVITAMIN WITH MINERALS) TABS tablet Take 1 tablet by mouth daily.  ? pravastatin (PRAVACHOL) 40 MG tablet Take 40 mg by mouth daily.  ?  ? ?PAST MEDICAL HISTORY: ?Past Medical History:  ?Diagnosis Date  ? Asthma, mild   ? Intermittent  ? Atrial fibrillation ([place])   ? Feb 2023  ? Cancer [facility])   ? Chronic back pain   ? Echocardiogram abnormal 09/2008  ? With mild aortic valve and mitral valve regurgitation  ? Heart murmur   ? History of left breast cancer 1999  ? Hyperlipidemia   ? Hypertension   ? Hypothyroidism   ? IBS (irritable bowel syndrome)   ? Lactose intolerance   ? Osteoporosis   ? Peptic ulcer disease   ? Type II diabetes mellitus ([place])   ? [facility] 08-2012  ? Ulcer, duodenal, acute, with obstruction 1999  ? ? ?PAST SURGICAL HISTORY: ?Past Surgical History:  ?Procedure Laterality Date  ? ABDOMINAL HYSTERECTOMY    ? BREAST IMPLANT REMOVAL    ? Breast implant infected removed implant 06/06 infected and removed 11/05  ? BREAST LUMPECTOMY  09/99  ? Left breast  ? BREAST RECONSTRUCTION    ? CARDIOVERSION N/A 08/11/2021  ? Procedure: CARDIOVERSION;  Surgeon: Rex Kras, DO;  Location: [facility] END[facility];  Service: Cardiovascular;  Laterality: N/A;  ? CHOLECYSTECTOMY    ? CYSTOSCOPY W/ URETERAL STENT PLACEMENT Left 01/19/2018  ? Procedure: CYSTOSCOPY WITH RETROGRADE PYELOGRAM/URETERAL STENT PLACEMENT;  Surgeon: Franchot Gallo, MD;  Location: [facility] ORS;  Service: Urology;  Laterality: Left;  ? EXTRACORPOREAL SHOCK WAVE LITHOTRIPSY Left 02/15/2018  ? Procedure: LEFT EXTRACORPOREAL SHOCK WAVE LITHOTRIPSY (ESWL);   Surgeon: Cleon Gustin, MD;  Location: [facility] OR[facility];  Service: Urology;  Laterality: Left;  ? MASTECTOMY  03/05  ? Left  ? other    ? Left Arm FX  ? OTHER SURGICAL HISTORY    ? Hysterectomy- Ovaries intact   ? OTHER SURGICAL HISTORY    ? Fracture Left HIp  ? PARTIAL GASTRECTOMY  1999  ? ? ?FAMILY HISTORY: ?The patient family history includes Diabetes in her brother, father, and sister; Diabetes Mellitus II in her father; Heart attack in her father; Heart failure in her mother; Hypertension in her father; Osteoporosis in her mother; Thyroid cancer in her sister. ? ?SOCIAL HISTORY:  ?The patient  reports that she quit smoking about 21 years ago. Her smoking use included cigarettes. She has never used smokeless tobacco. She reports that she does not drink alcohol and does not use drugs. ? ?REVIEW OF SYSTEMS: ?Review of Systems  ?Constitutional: Negative for malaise/fatigue.  ?Cardiovascular:  Positive for leg swelling. Negative for chest pain, dyspnea on exertion, irregular heartbeat, near-syncope, orthopnea, palpitations, paroxysmal nocturnal dyspnea  and syncope.  ?Respiratory:  Positive for shortness of breath (better).   ? ?PHYSICAL EXAM: ? ?  08/23/2021  ?  1:59 PM 08/23/2021  ?  1:46 PM 08/11/2021  ?  1:57 PM  ?Vitals with BMI  ?Height  '5\' 3"'$    ?Weight  108 lbs   ?BMI  19.14   ?Systolic 254 270 623  ?Diastolic 74 78 67  ?Pulse 88 88 94  ? ? ?CONSTITUTIONAL: Well-developed and well-nourished. No acute distress.  ?SKIN: Skin is warm and dry. No rash noted. No cyanosis. No pallor. No jaundice ?HEAD: Normocephalic and atraumatic.  ?EYES: No scleral icterus ?MOUTH/THROAT: Moist oral membranes.  ?NECK: No JVD present. No thyromegaly noted. No carotid bruits  ?LYMPHATIC: No visible cervical adenopathy.  ?CHEST Normal respiratory effort. No intercostal retractions.  Left mastectomy ?LUNGS: Clear to auscultation bilaterally.  No stridor. No wheezes. No rales.  ?CARDIOVASCULAR: Regular rate and rhythm, positive S1-S2, 3 out of  6 systolic ejection murmur heard at the second right intercostal space, holosystolic murmur heard at the apex, no rubs or gallops appreciated.   ?ABDOMINAL: soft, nontender, nondistended, positive bowel sounds all 4 quadrant

## 2021-08-25 ENCOUNTER — Ambulatory Visit: Payer: Medicare Other

## 2021-08-25 DIAGNOSIS — I480 Paroxysmal atrial fibrillation: Secondary | ICD-10-CM | POA: Diagnosis not present

## 2021-08-31 ENCOUNTER — Ambulatory Visit: Payer: Medicare Other

## 2021-08-31 DIAGNOSIS — E119 Type 2 diabetes mellitus without complications: Secondary | ICD-10-CM

## 2021-08-31 DIAGNOSIS — I700 Atherosclerosis of aorta: Secondary | ICD-10-CM | POA: Diagnosis not present

## 2021-08-31 DIAGNOSIS — I480 Paroxysmal atrial fibrillation: Secondary | ICD-10-CM | POA: Diagnosis not present

## 2021-09-03 DIAGNOSIS — M0579 Rheumatoid arthritis with rheumatoid factor of multiple sites without organ or systems involvement: Secondary | ICD-10-CM | POA: Diagnosis not present

## 2021-09-16 ENCOUNTER — Other Ambulatory Visit: Payer: Self-pay | Admitting: Cardiology

## 2021-09-16 DIAGNOSIS — I10 Essential (primary) hypertension: Secondary | ICD-10-CM

## 2021-09-16 NOTE — Progress Notes (Signed)
Called and spoke to patient she is aware of results

## 2021-09-16 NOTE — Progress Notes (Signed)
Called and spoke to patient she was transferred to front desk to get f/u scheduled

## 2021-09-17 DIAGNOSIS — K219 Gastro-esophageal reflux disease without esophagitis: Secondary | ICD-10-CM | POA: Diagnosis not present

## 2021-09-17 DIAGNOSIS — E039 Hypothyroidism, unspecified: Secondary | ICD-10-CM | POA: Diagnosis not present

## 2021-09-17 DIAGNOSIS — F324 Major depressive disorder, single episode, in partial remission: Secondary | ICD-10-CM | POA: Diagnosis not present

## 2021-09-17 DIAGNOSIS — I480 Paroxysmal atrial fibrillation: Secondary | ICD-10-CM | POA: Diagnosis not present

## 2021-09-17 DIAGNOSIS — G894 Chronic pain syndrome: Secondary | ICD-10-CM | POA: Diagnosis not present

## 2021-09-17 DIAGNOSIS — Z0000 Encounter for general adult medical examination without abnormal findings: Secondary | ICD-10-CM | POA: Diagnosis not present

## 2021-09-17 DIAGNOSIS — M069 Rheumatoid arthritis, unspecified: Secondary | ICD-10-CM | POA: Diagnosis not present

## 2021-09-17 DIAGNOSIS — E7800 Pure hypercholesterolemia, unspecified: Secondary | ICD-10-CM | POA: Diagnosis not present

## 2021-09-17 DIAGNOSIS — E1169 Type 2 diabetes mellitus with other specified complication: Secondary | ICD-10-CM | POA: Diagnosis not present

## 2021-09-17 DIAGNOSIS — I10 Essential (primary) hypertension: Secondary | ICD-10-CM | POA: Diagnosis not present

## 2021-09-20 ENCOUNTER — Ambulatory Visit: Payer: Medicare Other | Admitting: Cardiology

## 2021-09-20 DIAGNOSIS — M810 Age-related osteoporosis without current pathological fracture: Secondary | ICD-10-CM | POA: Diagnosis not present

## 2021-09-22 ENCOUNTER — Other Ambulatory Visit: Payer: Self-pay

## 2021-09-22 DIAGNOSIS — R0602 Shortness of breath: Secondary | ICD-10-CM

## 2021-09-23 ENCOUNTER — Ambulatory Visit: Payer: Medicare Other | Admitting: Cardiology

## 2021-09-29 ENCOUNTER — Ambulatory Visit: Payer: Medicare Other | Admitting: Cardiology

## 2021-09-30 ENCOUNTER — Encounter: Payer: Self-pay | Admitting: Cardiology

## 2021-09-30 ENCOUNTER — Ambulatory Visit: Payer: Self-pay | Admitting: Pharmacist

## 2021-09-30 ENCOUNTER — Ambulatory Visit: Payer: Medicare Other | Admitting: Cardiology

## 2021-09-30 ENCOUNTER — Other Ambulatory Visit: Payer: Self-pay | Admitting: Cardiology

## 2021-09-30 VITALS — BP 149/82 | HR 89 | Temp 97.4°F | Resp 16 | Ht 63.0 in | Wt 112.0 lb

## 2021-09-30 DIAGNOSIS — Z7901 Long term (current) use of anticoagulants: Secondary | ICD-10-CM

## 2021-09-30 DIAGNOSIS — I4819 Other persistent atrial fibrillation: Secondary | ICD-10-CM

## 2021-09-30 DIAGNOSIS — I10 Essential (primary) hypertension: Secondary | ICD-10-CM

## 2021-09-30 DIAGNOSIS — Z5181 Encounter for therapeutic drug level monitoring: Secondary | ICD-10-CM | POA: Diagnosis not present

## 2021-09-30 DIAGNOSIS — E119 Type 2 diabetes mellitus without complications: Secondary | ICD-10-CM

## 2021-09-30 DIAGNOSIS — I480 Paroxysmal atrial fibrillation: Secondary | ICD-10-CM | POA: Diagnosis not present

## 2021-09-30 DIAGNOSIS — I700 Atherosclerosis of aorta: Secondary | ICD-10-CM

## 2021-09-30 DIAGNOSIS — I011 Acute rheumatic endocarditis: Secondary | ICD-10-CM

## 2021-09-30 DIAGNOSIS — R0602 Shortness of breath: Secondary | ICD-10-CM | POA: Diagnosis not present

## 2021-09-30 DIAGNOSIS — I350 Nonrheumatic aortic (valve) stenosis: Secondary | ICD-10-CM

## 2021-09-30 DIAGNOSIS — I080 Rheumatic disorders of both mitral and aortic valves: Secondary | ICD-10-CM

## 2021-09-30 MED ORDER — DILTIAZEM HCL ER BEADS 360 MG PO CP24: 360.0000 mg | ORAL_CAPSULE | Freq: Every day | ORAL | 0 refills | Status: DC

## 2021-09-30 MED ORDER — ENOXAPARIN SODIUM 40 MG/0.4ML IJ SOSY: 50.0000 mg | PREFILLED_SYRINGE | Freq: Two times a day (BID) | INTRAMUSCULAR | Status: AC

## 2021-09-30 MED ORDER — WARFARIN SODIUM 5 MG PO TABS: 5.0000 mg | ORAL_TABLET | Freq: Every day | ORAL | 3 refills | Status: DC

## 2021-09-30 MED ORDER — LOSARTAN POTASSIUM 50 MG PO TABS: 50.0000 mg | ORAL_TABLET | Freq: Every day | ORAL | 0 refills | Status: DC

## 2021-09-30 NOTE — Progress Notes (Signed)
[name] is currently on Eliquis and transitioning to Coumadin. Shepresents today to review this change as well as administration counseling for lovenox bridge. She will stop Eliquis today and report back to clinic on 6-2 to Lovenox for 5 days. She will start taking Coumadin '5mg'$  daily as well.  Current Labs: Hematology Lab Results  Component Value Date/Time   WBC 21.8 (H) 01/22/2018 05:18 AM   RBC 3.35 (L) 01/22/2018 05:18 AM   HGB 13.6 08/11/2021 12:24 PM   HGB 11.1 06/29/2021 02:21 PM   HCT 40.0 08/11/2021 12:24 PM   HCT 33.5 (L) 06/29/2021 02:21 PM   MCV 89.0 01/22/2018 05:18 AM   MCH 29.9 01/22/2018 05:18 AM   Platelets  Date Value Ref Range Status  01/22/2018 142 (L) 150 - 400 K/uL Final    Comment:    Performed at [facility], [place] [address] [place], [place] [address]   No results found for: PTT INR  Date Value Ref Range Status  04/22/2009 3.14 (H) 0.00 - 1.49 Final    [name] is stable with regards to anticoagulation.  Warfarin will be started with a target INR of 2-3.  Will begin with dose of 5 mg daily and follow INR.  Vanessa Kick

## 2021-09-30 NOTE — Progress Notes (Signed)
ID:  [name], DOB [birth year], MRN [id number]  PCP:  Shirline Frees, MD  Cardiologist:  Rex Kras, DO, [facility] (established care 06/28/21)  Date: 07/26/2021 Last Office Visit: 06/28/2021  Chief Complaint  Patient presents with   Atrial Fibrillation   Results   Follow-up    HPI  [name] is a 70 y.o. Caucasian female whose past medical history and cardiovascular risk factors include: Hx of breast cancer twice (index event lumpectomy/chemo/radiation, second episode left mastectomy), HTN, Atrial fibrillation, hypothyroidism, NIDDM Type II, RA, post menopausal, advance age, former smoker, aortic atherosclerosis.   She is referred to the office at the request of Shirline Frees, MD for evaluation of atrial fibrillation.  In the early 2023 patient had an episode of shingles and towards the end of her infection had persistent episodes of palpitations.  She followed up with PCP and was noted to have new diagnosis of atrial fibrillation.  She was started on AV nodal blocking agents and oral anticoagulation for thromboembolic prophylaxis for 4 weeks.  Since she continues to remain symptomatic she underwent direct-current cardioversion and restored normal sinus rhythm with 150 J x 1.  After obtaining normal sinus rhythm she was recommended to undergo echocardiogram and stress test given the new diagnosis of paroxysmal atrial fibrillation.  She is now here for follow-up.  Results of the echo and stress test reviewed with her in great detail including images.  I had a long discussion with the patient that the echocardiogram findings are consistent with valvular A-fib and therefore her current medication regiment needs to be changed.  She is currently on AV nodal blocking agents which needs to be uptitrated to maintain adequate ventricular rate control to prevent recurrence of A-fib.  And her direct oral anticoagulants need to be changed to Coumadin.  Patient states that her home blood  pressures are ranging between 130-149 mmHg, DBP ranges between 70-80 mmHg, and pulse between 70-80 bpm.  Patient states that she does not check her vitals as often as she should post cardioversion.   FUNCTIONAL STATUS: No structured exercise program or daily routine.   ALLERGIES: Allergies  Allergen Reactions   Fish Allergy Anaphylaxis and Hives   Amaryl [Glimepiride] Itching   Avalide [Irbesartan-Hydrochlorothiazide] Itching   Avandia [Rosiglitazone] Itching   Byetta 10 Mcg Pen [Exenatide] Itching   Codeine Itching    Other reaction(s): Unknown   Demerol [Meperidine Hcl] Itching   Glucovance [Glyburide-Metformin] Itching   Iodinated Contrast Media Itching    Other reaction(s): Unknown   Lisinopril Itching   Naprosyn [Naproxen] Hives   Penicillins Itching and Other (See Comments)    Has patient had a PCN reaction causing immediate rash, facial/tongue/throat swelling, SOB or lightheadedness with hypotension: no Has patient had a PCN reaction causing severe rash involving mucus membranes or skin necrosis: no Has patient had a PCN reaction that required hospitalization: yes Has patient had a PCN reaction occurring within the last 10 years: no If all of the above answers are "NO", then may proceed with Cephalosporin use.    MEDICATION LIST PRIOR TO VISIT: Current Meds  Medication Sig   abatacept (ORENCIA) 250 MG injection Inject into the vein every 30 (thirty) days.   apixaban (ELIQUIS) 5 MG TABS tablet Take 1 tablet (5 mg total) by mouth 2 (two) times daily.   buPROPion (WELLBUTRIN XL) 150 MG 24 hr tablet Take 150 mg by mouth every morning.   denosumab (PROLIA) 60 MG/ML SOSY injection Inject 60 mg into the  skin every 6 (six) months.   diltiazem (TIAZAC) 360 MG 24 hr capsule Take 1 capsule (360 mg total) by mouth daily.   escitalopram (LEXAPRO) 10 MG tablet Take 10 mg by mouth daily.   esomeprazole (NEXIUM) 20 MG capsule Take 40 mg by mouth daily.    folic acid (FOLVITE) 1 MG  tablet Take 2 mg by mouth daily.   hydrochlorothiazide (HYDRODIURIL) 25 MG tablet TAKE 1 TABLET BY MOUTH EVERY DAY IN THE MORNING   HYDROcodone-acetaminophen (NORCO) 10-325 MG tablet Take 1 tablet by mouth every 6 (six) hours as needed (For pain.).   hydrocortisone (ANUSOL-HC) 25 MG suppository Place 25 mg rectally 2 (two) times daily as needed for hemorrhoids.    hydroxychloroquine (PLAQUENIL) 200 MG tablet Take 200 mg by mouth daily.   levothyroxine (SYNTHROID) 175 MCG tablet Take 1 tablet by mouth daily.   metFORMIN (GLUCOPHAGE-XR) 500 MG 24 hr tablet Take 4 tablets by mouth daily.   morphine (MS CONTIN) 30 MG 12 hr tablet Take 30 mg by mouth every 12 (twelve) hours.   Multiple Vitamin (MULTIVITAMIN WITH MINERALS) TABS tablet Take 1 tablet by mouth daily.   pravastatin (PRAVACHOL) 40 MG tablet Take 40 mg by mouth daily.   [DISCONTINUED] diltiazem (CARDIZEM CD) 300 MG 24 hr capsule Take 1 capsule (300 mg total) by mouth daily.   [DISCONTINUED] losartan (COZAAR) 25 MG tablet Take 1 tablet (25 mg total) by mouth daily.     PAST MEDICAL HISTORY: Past Medical History:  Diagnosis Date   Asthma, mild    Intermittent   Atrial fibrillation [facility])    Feb 2023   Cancer [facility])    Chronic back pain    Echocardiogram abnormal 09/2008   With mild aortic valve and mitral valve regurgitation   Heart murmur    History of left breast cancer 1999   Hyperlipidemia    Hypertension    Hypothyroidism    IBS (irritable bowel syndrome)    Lactose intolerance    Osteoporosis    Peptic ulcer disease    Type II diabetes mellitus ([place])    [facility] 08-2012   Ulcer, duodenal, acute, with obstruction 1999    PAST SURGICAL HISTORY: Past Surgical History:  Procedure Laterality Date   ABDOMINAL HYSTERECTOMY     BREAST IMPLANT REMOVAL     Breast implant infected removed implant 06/06 infected and removed 11/05   BREAST LUMPECTOMY  09/99   Left breast   BREAST RECONSTRUCTION     CARDIOVERSION N/A 08/11/2021    Procedure: CARDIOVERSION;  Surgeon: Rex Kras, DO;  Location: [facility];  Service: Cardiovascular;  Laterality: N/A;   CHOLECYSTECTOMY     CYSTOSCOPY W/ URETERAL STENT PLACEMENT Left 01/19/2018   Procedure: CYSTOSCOPY WITH RETROGRADE PYELOGRAM/URETERAL STENT PLACEMENT;  Surgeon: Franchot Gallo, MD;  Location: [facility];  Service: Urology;  Laterality: Left;   EXTRACORPOREAL SHOCK WAVE LITHOTRIPSY Left 02/15/2018   Procedure: LEFT EXTRACORPOREAL SHOCK WAVE LITHOTRIPSY (ESWL);  Surgeon: Cleon Gustin, MD;  Location: [facility];  Service: Urology;  Laterality: Left;   MASTECTOMY  03/05   Left   other     Left Arm FX   OTHER SURGICAL HISTORY     Hysterectomy- Ovaries intact    OTHER SURGICAL HISTORY     Fracture Left HIp   PARTIAL GASTRECTOMY  1999    FAMILY HISTORY: The patient family history includes Diabetes in her brother, father, and sister; Diabetes Mellitus II in her father; Heart attack in her father; Heart failure  in her mother; Hypertension in her father; Osteoporosis in her mother; Thyroid cancer in her sister.  SOCIAL HISTORY:  The patient  reports that she quit smoking about 21 years ago. Her smoking use included cigarettes. She has never used smokeless tobacco. She reports that she does not drink alcohol and does not use drugs.  REVIEW OF SYSTEMS: Review of Systems  Constitutional: Negative for malaise/fatigue.  Cardiovascular:  Positive for leg swelling. Negative for chest pain, dyspnea on exertion, irregular heartbeat, near-syncope, orthopnea, palpitations, paroxysmal nocturnal dyspnea and syncope.  Respiratory:  Positive for shortness of breath (better).    PHYSICAL EXAM:    09/30/2021    1:56 PM 08/23/2021    1:59 PM 08/23/2021    1:46 PM  Vitals with BMI  Height '5\' 3"'$   '5\' 3"'$   Weight 112 lbs  108 lbs  BMI 35.32  99.24  Systolic 268 341 962  Diastolic 82 74 78  Pulse 89 88 88    CONSTITUTIONAL: Well-developed and well-nourished. No acute distress.  SKIN:  Skin is warm and dry. No rash noted. No cyanosis. No pallor. No jaundice HEAD: Normocephalic and atraumatic.  EYES: No scleral icterus MOUTH/THROAT: Moist oral membranes.  NECK: No JVD present. No thyromegaly noted. No carotid bruits  CHEST Normal respiratory effort. No intercostal retractions.  Left mastectomy LUNGS: Clear to auscultation bilaterally.  No stridor. No wheezes. No rales.  CARDIOVASCULAR: Regular rate and rhythm, positive S1-S2, 3 out of 6 systolic ejection murmur heard at the second right intercostal space, holosystolic murmur heard at the apex, no rubs or gallops appreciated.   ABDOMINAL: soft, nontender, nondistended, positive bowel sounds all 4 quadrants. No apparent ascites.  EXTREMITIES: +1 pitting edema in bilateral feet, warm to touch, +1 bilateral DP and PT pulses HEMATOLOGIC: No significant bruising NEUROLOGIC: Oriented to person, place, and time. Nonfocal. Normal muscle tone.  PSYCHIATRIC: Normal mood and affect. Normal behavior. Cooperative  CARDIAC DATABASE: Direct-current cardioversion: 08/11/2021 150 J x 1 converted to normal sinus rhythm.  EKG: 06/28/2021: Atrial fibrillation, 107pm, nonspecific ST-T changes, without underlying injury pattern.  August 23, 2021: Normal sinus rhythm, 84 bpm, LVH per voltage criteria, without underlying ischemia or injury pattern.  Echocardiogram: 08/25/2021:  Normal LV systolic function with visual EF 55-60%. Left ventricle cavity is small in size. Mild basal asymmetric hypertrophy of the left ventricle. Mild concentric LVH. Normal global wall motion. Mild flattening of the septum may be due to bundle branch block vs RV pressure overload. Indeterminate diastolic filling pattern due to mitral apparatus calcification, elevated LAP.  Left atrial cavity is severely dilated at 4.9 cm. Mild calcification of the aortic valve annulus. Mildly restricted aortic valve leaflets. Trileaflet aortic valve. Mild aortic stenosis. Moderate (Grade  II) aortic regurgitation. Peak velocity 2.53 m/s, Peak Pressure Gradient 25.6 mmHg, Mean Gradient 15.6 mmHg, AVA 1.0 cm, Dimensionless Index 0.38. Severe calcification of the mitral valve annulus. Mild mitral valve leaflet calcification. Moderately restricted mitral valve leaflets. Moderate mitral stenosis. Severe (Grade III) mitral regurgitation. Mitral valve peak pressure gradient 26.6, mean pressure gradient 13.8 mmHg, calculated mitral valve area by pressure half-time is 1.3 cm. Marked elevation in the velocity is due to severe MR and flow. Moderate tricuspid regurgitation. Moderate pulmonary hypertension. RVSP measures 44 mmHg. Pericardium is normal. Insignificant pericardial effusion. IVC is normal with poor inspiration collapse consistent with elevated right atrial pressure (64m Hg).    Stress Testing: Lexiscan Tetrofosmin stress test 08/31/2021: 1 Day Rest/Stress Protocol. Stress ECG negative for ischemia. Small size, mild  intensity, reversible perfusion defect in apical lateral segment suggestive of possible ischemia in the distal LCx distribution. No convincing evidence of prior infarct. Left ventricular wall thickness and size preserved. Calculated LVEF 66%, visually appears hyperdynamic. No prior studies available for comparison. Low risk study.  Heart Catheterization: None  LABORATORY DATA:    Latest Ref Rng & Units 08/11/2021   12:24 PM 06/29/2021    2:21 PM 01/22/2018    5:18 AM  CBC  WBC 4.0 - 10.5 K/uL   21.8    Hemoglobin 12.0 - 15.0 g/dL 13.6   11.1   10.0    Hematocrit 36.0 - 46.0 % 40.0   33.5   29.8    Platelets 150 - 400 K/uL   142         Latest Ref Rng & Units 08/11/2021   12:24 PM 06/29/2021    2:21 PM 01/22/2018    5:18 AM  CMP  Glucose 70 - 99 mg/dL 199   187   131    BUN 8 - 23 mg/dL '13   11   13    '$ Creatinine 0.44 - 1.00 mg/dL 0.40   0.78   0.46    Sodium 135 - 145 mmol/L 138   140   138    Potassium 3.5 - 5.1 mmol/L 3.8   3.9   3.8    Chloride 98  - 111 mmol/L 95   99   100    CO2 mmol/L  CANCELED   27    Calcium 8.7 - 10.3 mg/dL  8.9   7.8      Lipid Panel  No results found for: CHOL, TRIG, HDL, CHOLHDL, VLDL, LDLCALC, LDLDIRECT, LABVLDL  No components found for: NTPROBNP Recent Labs    06/29/21 1421  PROBNP 1,604*   No results for input(s): TSH in the last 8760 hours.  BMP Recent Labs    06/29/21 1421 08/11/21 1224  NA 140 138  K 3.9 3.8  CL 99 95*  CO2 CANCELED  --   GLUCOSE 187* 199*  BUN 11 13  CREATININE 0.78 0.40*  CALCIUM 8.9  --     HEMOGLOBIN A1C No results found for: HGBA1C, MPG  External Labs: Collected: 03/12/2021 provided by referring physician. TSH 0.11 (below normal limits). Free T4   1.27 (above normal limits.) Total cholesterol 114, triglycerides 110, HDL 67, LDL 27 Hemoglobin A1c 6.8.  External Labs: Collected: 06/21/2021 provided by referring physician. TSH 0.87 (within normal limits)   IMPRESSION:    ICD-10-CM   1. Paroxysmal atrial fibrillation (HCC)  I48.0 diltiazem (TIAZAC) 360 MG 24 hr capsule    2. Long term (current) use of anticoagulants  Z79.01     3. Benign hypertension  I10 losartan (COZAAR) 50 MG tablet    Basic metabolic panel    4. Non-insulin dependent type 2 diabetes mellitus ([place])  E11.9     5. 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 Mitral stenosis and aortic regurgitation  I08.0     8. Nonrheumatic aortic valve stenosis  I35.0        RECOMMENDATIONS: [name] is a 71 y.o. Caucasian female whose past medical history and cardiac risk factors include: Hx of breast cancer twice (index event lumpectomy/chemo/radiation, second episode left mastectomy), HTN, Atrial fibrillation, hypothyroidism, NIDDM Type II, RA, post menopausal, advance age, former smoker, aortic atherosclerosis.   Paroxysmal valvular atrial fibrillation (HCC) Rate  control: Diltiazem. Rhythm control N/A. Thromboembolic prophylaxis  Eliquis Discovered in February 2023. CHA2DS2-VASc SCORE is 5 which correlates to 6.7% risk of stroke per year (HTN, age, DM, aortic atherosclerosis, gender).  Status post direct-current cardioversion August 11, 2021 to normal sinus rhythm at 150 J x 1 Patient's ventricular rate during her stress test was not well controlled likely due to holding medications prior to the study.  Given her valvular atrial fibrillation would like to be more aggressive with rate control strategy to prevent episodes of A-fib.  We will increase diltiazem to 360 mg p.o. daily. Monitor for now.  Long term (current) use of anticoagulants Indication: Paroxysmal atrial fibrillation. Discontinue Eliquis. We will transition her to Lovenox/Coumadin. Enroll into Coumadin clinic for valvular A-fib with a goal INR between 2-3. Discussed medication profile with the patient at today's office visit.  Benign hypertension Office blood pressures are not well controlled. We will increase losartan from 25 mg p.o. daily to 50 mg p.o. daily Labs in 1 week to evaluate kidney function and electrolytes  Non-insulin dependent type 2 diabetes mellitus ([place]) Educated the importance of glycemic control.  Mitral stenosis and aortic regurgitation Newly discovered. A-fib management changes as discussed above. We will monitor for now.  Nonrheumatic aortic valve stenosis Currently asymptomatic. We will continue to monitor.  FINAL MEDICATION LIST END OF ENCOUNTER: Meds ordered this encounter  Medications   losartan (COZAAR) 50 MG tablet    Sig: Take 1 tablet (50 mg total) by mouth daily.    Dispense:  30 tablet    Refill:  0   diltiazem (TIAZAC) 360 MG 24 hr capsule    Sig: Take 1 capsule (360 mg total) by mouth daily.    Dispense:  90 capsule    Refill:  0    Medications Discontinued During This Encounter  Medication Reason   diltiazem (CARDIZEM CD) 300 MG 24 hr capsule Reorder   losartan (COZAAR) 25 MG tablet Reorder       Current Outpatient Medications:    abatacept (ORENCIA) 250 MG injection, Inject into the vein every 30 (thirty) days., Disp: , Rfl:    apixaban (ELIQUIS) 5 MG TABS tablet, Take 1 tablet (5 mg total) by mouth 2 (two) times daily., Disp: 180 tablet, Rfl: 0   buPROPion (WELLBUTRIN XL) 150 MG 24 hr tablet, Take 150 mg by mouth every morning., Disp: , Rfl:    denosumab (PROLIA) 60 MG/ML SOSY injection, Inject 60 mg into the skin every 6 (six) months., Disp: , Rfl:    diltiazem (TIAZAC) 360 MG 24 hr capsule, Take 1 capsule (360 mg total) by mouth daily., Disp: 90 capsule, Rfl: 0   escitalopram (LEXAPRO) 10 MG tablet, Take 10 mg by mouth daily., Disp: , Rfl:    esomeprazole (NEXIUM) 20 MG capsule, Take 40 mg by mouth daily. , Disp: , Rfl:    folic acid (FOLVITE) 1 MG tablet, Take 2 mg by mouth daily., Disp: , Rfl:    hydrochlorothiazide (HYDRODIURIL) 25 MG tablet, TAKE 1 TABLET BY MOUTH EVERY DAY IN THE MORNING, Disp: 90 tablet, Rfl: 1   HYDROcodone-acetaminophen (NORCO) 10-325 MG tablet, Take 1 tablet by mouth every 6 (six) hours as needed (For pain.)., Disp: , Rfl:    hydrocortisone (ANUSOL-HC) 25 MG suppository, Place 25 mg rectally 2 (two) times daily as needed for hemorrhoids. , Disp: , Rfl:    hydroxychloroquine (PLAQUENIL) 200 MG tablet, Take 200 mg by mouth daily., Disp: , Rfl:    levothyroxine (SYNTHROID) 175  MCG tablet, Take 1 tablet by mouth daily., Disp: , Rfl:    metFORMIN (GLUCOPHAGE-XR) 500 MG 24 hr tablet, Take 4 tablets by mouth daily., Disp: , Rfl:    morphine (MS CONTIN) 30 MG 12 hr tablet, Take 30 mg by mouth every 12 (twelve) hours., Disp: , Rfl:    Multiple Vitamin (MULTIVITAMIN WITH MINERALS) TABS tablet, Take 1 tablet by mouth daily., Disp: , Rfl:    pravastatin (PRAVACHOL) 40 MG tablet, Take 40 mg by mouth daily., Disp: , Rfl:    losartan (COZAAR) 50 MG tablet, Take 1 tablet (50 mg total) by mouth daily., Disp: 30 tablet, Rfl: 0   warfarin (COUMADIN) 5 MG tablet, Take 1  tablet (5 mg total) by mouth daily., Disp: 90 tablet, Rfl: 3  Orders Placed This Encounter  Procedures   Basic metabolic panel    There are no Patient Instructions on file for this visit.   --Continue cardiac medications as reconciled in final medication list. --Return in about 3 months (around 12/31/2021) for Follow up, A. fib, mitral disease. Or sooner if needed. --Continue follow-up with your primary care physician regarding the management of your other chronic comorbid conditions.  Patient's questions and concerns were addressed to her satisfaction. She voices understanding of the instructions provided during this encounter.   This note was created using a voice recognition software as a result there may be grammatical errors inadvertently enclosed that do not reflect the nature of this encounter. Every attempt is made to correct such errors.  Rex Kras, [place], [facility]  Pager: [phone] Office: [phone]

## 2021-09-30 NOTE — Patient Instructions (Signed)
Patient is to stop Eliquis today.Report to clinic on 10-01-2021 to Lovenox administration.

## 2021-10-04 DIAGNOSIS — M0579 Rheumatoid arthritis with rheumatoid factor of multiple sites without organ or systems involvement: Secondary | ICD-10-CM | POA: Diagnosis not present

## 2021-10-05 ENCOUNTER — Telehealth: Payer: Self-pay

## 2021-10-06 NOTE — Telephone Encounter (Signed)
The reason I increased the diltiazem was to improve her HR. I would keep an eye out on her HR for now. If still evaluated we will have to add back metoprolol (re-trail). Please have her call her PCP to be evaluated for anxiety as discussed at the last visit.   Dr. Terri Skains

## 2021-10-08 NOTE — Telephone Encounter (Signed)
If she can tolerate the higher dose of diltiazem it will help controlled her HR better given her Afib. Please have her keep [place] in the loop. Ask her how the transition to coumadin is going?  Dr. Terri Skains

## 2021-10-08 NOTE — Telephone Encounter (Signed)
Called and spoke to patient she said she went back to 300 mg daily and feels a lot better she took one yesterday and today. Her bp earlier today was 138/79 with a heart rate of 84. She believes the 360 mg is too much but she is going to try taking a 360 mg tomorrow see how her "body does with being on 300 mg the past two days" Patient stated she has not f/u with PCP and believes that all this is actually coming from this medication

## 2021-10-12 ENCOUNTER — Ambulatory Visit: Payer: Medicare Other | Admitting: Cardiology

## 2021-10-12 DIAGNOSIS — Z5181 Encounter for therapeutic drug level monitoring: Secondary | ICD-10-CM | POA: Diagnosis not present

## 2021-10-12 DIAGNOSIS — Z7901 Long term (current) use of anticoagulants: Secondary | ICD-10-CM | POA: Diagnosis not present

## 2021-10-12 DIAGNOSIS — I4819 Other persistent atrial fibrillation: Secondary | ICD-10-CM

## 2021-10-12 DIAGNOSIS — Z79899 Other long term (current) drug therapy: Secondary | ICD-10-CM | POA: Insufficient documentation

## 2021-10-12 LAB — POCT INR: INR: 4.6 — AB (ref 2–3)

## 2021-10-12 NOTE — Progress Notes (Signed)
Subjective:    [name] is a 71 y.o. female here for follow-up of chronic anticoagulation for atrial fibrillation. Bleeding signs/symptoms:  None Thromboembolic signs/symptoms:  None  Missed Coumadin doses: None Medication changes: yes - 2.5 daily Dietary changes: no Bacterial/viral infection: no Other concerns: no     Objective:    Current warfarin (COUMADIN) dose: 2.5 daily starting 6/14 Lab Results  Component Value Date   INR 4.6 (A) 10/12/2021   INR 3.14 (H) 04/22/2009   INR 2.35 (H) 04/21/2009   INR 1.98 (H) 04/20/2009      Assessment:    Supratherapeutic INR for goal of 2-3    Plan:    Dose: Hold today dose, reduce to 2.5 dmaily Next INR: 1 week

## 2021-10-19 ENCOUNTER — Ambulatory Visit: Payer: Medicare Other | Admitting: Student

## 2021-10-19 DIAGNOSIS — Z7901 Long term (current) use of anticoagulants: Secondary | ICD-10-CM | POA: Diagnosis not present

## 2021-10-19 DIAGNOSIS — I4819 Other persistent atrial fibrillation: Secondary | ICD-10-CM

## 2021-10-19 DIAGNOSIS — Z5181 Encounter for therapeutic drug level monitoring: Secondary | ICD-10-CM | POA: Diagnosis not present

## 2021-10-19 LAB — POCT INR: INR: 2 (ref 2.0–3.0)

## 2021-10-19 NOTE — Progress Notes (Signed)
Subjective:    [name] is a 71 y.o. female here for follow-up of chronic anticoagulation for atrial fibrillation. Bleeding signs/symptoms:  None Thromboembolic signs/symptoms:  None  Missed Coumadin doses: None Medication changes: No Dietary changes: no Bacterial/viral infection: no Other concerns: no   Objective:    Current warfarin (COUMADIN) dose: 2.5 daily starting 6/14 Lab Results  Component Value Date   INR 2.0 10/19/2021   INR 4.6 (A) 10/12/2021   INR 3.14 (H) 04/22/2009   INR 2.35 (H) 04/21/2009   INR 1.98 (H) 04/20/2009      Assessment:   Therapeutic INR for goal of 2-3    Plan:    Dose: Continue 2.5 mg daily  Next INR: 3 weeks

## 2021-10-21 ENCOUNTER — Other Ambulatory Visit: Payer: Self-pay | Admitting: Cardiology

## 2021-10-21 DIAGNOSIS — I4819 Other persistent atrial fibrillation: Secondary | ICD-10-CM

## 2021-10-22 ENCOUNTER — Other Ambulatory Visit: Payer: Self-pay | Admitting: Cardiology

## 2021-10-22 DIAGNOSIS — I10 Essential (primary) hypertension: Secondary | ICD-10-CM

## 2021-10-26 ENCOUNTER — Other Ambulatory Visit: Payer: Self-pay

## 2021-10-26 ENCOUNTER — Encounter (HOSPITAL_COMMUNITY): Payer: Self-pay

## 2021-10-26 ENCOUNTER — Inpatient Hospital Stay (HOSPITAL_COMMUNITY)
Admission: EM | Admit: 2021-10-26 | Discharge: 2021-10-30 | DRG: 854 | Disposition: A | Discharge status: Home / Self Care | Payer: Medicare Other | Attending: Internal Medicine | Admitting: Internal Medicine

## 2021-10-26 ENCOUNTER — Emergency Department (HOSPITAL_COMMUNITY): Payer: Medicare Other

## 2021-10-26 DIAGNOSIS — I4891 Unspecified atrial fibrillation: Secondary | ICD-10-CM | POA: Diagnosis present

## 2021-10-26 DIAGNOSIS — E039 Hypothyroidism, unspecified: Secondary | ICD-10-CM | POA: Diagnosis present

## 2021-10-26 DIAGNOSIS — M810 Age-related osteoporosis without current pathological fracture: Secondary | ICD-10-CM | POA: Diagnosis present

## 2021-10-26 DIAGNOSIS — N200 Calculus of kidney: Secondary | ICD-10-CM | POA: Diagnosis not present

## 2021-10-26 DIAGNOSIS — Z903 Acquired absence of stomach [part of]: Secondary | ICD-10-CM

## 2021-10-26 DIAGNOSIS — Z79899 Other long term (current) drug therapy: Secondary | ICD-10-CM | POA: Diagnosis not present

## 2021-10-26 DIAGNOSIS — Z7984 Long term (current) use of oral hypoglycemic drugs: Secondary | ICD-10-CM

## 2021-10-26 DIAGNOSIS — J45909 Unspecified asthma, uncomplicated: Secondary | ICD-10-CM | POA: Diagnosis present

## 2021-10-26 DIAGNOSIS — Z90710 Acquired absence of both cervix and uterus: Secondary | ICD-10-CM | POA: Diagnosis not present

## 2021-10-26 DIAGNOSIS — M069 Rheumatoid arthritis, unspecified: Secondary | ICD-10-CM | POA: Diagnosis not present

## 2021-10-26 DIAGNOSIS — A419 Sepsis, unspecified organism: Secondary | ICD-10-CM | POA: Diagnosis not present

## 2021-10-26 DIAGNOSIS — Z8249 Family history of ischemic heart disease and other diseases of the circulatory system: Secondary | ICD-10-CM

## 2021-10-26 DIAGNOSIS — E119 Type 2 diabetes mellitus without complications: Secondary | ICD-10-CM

## 2021-10-26 DIAGNOSIS — N281 Cyst of kidney, acquired: Secondary | ICD-10-CM | POA: Diagnosis not present

## 2021-10-26 DIAGNOSIS — G894 Chronic pain syndrome: Secondary | ICD-10-CM | POA: Diagnosis present

## 2021-10-26 DIAGNOSIS — Z853 Personal history of malignant neoplasm of breast: Secondary | ICD-10-CM

## 2021-10-26 DIAGNOSIS — E785 Hyperlipidemia, unspecified: Secondary | ICD-10-CM | POA: Diagnosis present

## 2021-10-26 DIAGNOSIS — Z7901 Long term (current) use of anticoagulants: Secondary | ICD-10-CM

## 2021-10-26 DIAGNOSIS — Z9049 Acquired absence of other specified parts of digestive tract: Secondary | ICD-10-CM | POA: Diagnosis not present

## 2021-10-26 DIAGNOSIS — E876 Hypokalemia: Secondary | ICD-10-CM | POA: Diagnosis not present

## 2021-10-26 DIAGNOSIS — G8929 Other chronic pain: Secondary | ICD-10-CM | POA: Diagnosis present

## 2021-10-26 DIAGNOSIS — I700 Atherosclerosis of aorta: Secondary | ICD-10-CM | POA: Diagnosis not present

## 2021-10-26 DIAGNOSIS — I10 Essential (primary) hypertension: Secondary | ICD-10-CM | POA: Diagnosis present

## 2021-10-26 DIAGNOSIS — Z808 Family history of malignant neoplasm of other organs or systems: Secondary | ICD-10-CM | POA: Diagnosis not present

## 2021-10-26 DIAGNOSIS — E872 Acidosis, unspecified: Secondary | ICD-10-CM | POA: Diagnosis present

## 2021-10-26 DIAGNOSIS — E1165 Type 2 diabetes mellitus with hyperglycemia: Secondary | ICD-10-CM | POA: Diagnosis not present

## 2021-10-26 DIAGNOSIS — Z9882 Breast implant status: Secondary | ICD-10-CM | POA: Diagnosis not present

## 2021-10-26 DIAGNOSIS — R5381 Other malaise: Secondary | ICD-10-CM | POA: Diagnosis not present

## 2021-10-26 DIAGNOSIS — Z79890 Hormone replacement therapy (postmenopausal): Secondary | ICD-10-CM

## 2021-10-26 DIAGNOSIS — Z87442 Personal history of urinary calculi: Secondary | ICD-10-CM | POA: Diagnosis not present

## 2021-10-26 DIAGNOSIS — R8271 Bacteriuria: Secondary | ICD-10-CM | POA: Diagnosis not present

## 2021-10-26 DIAGNOSIS — R1032 Left lower quadrant pain: Secondary | ICD-10-CM | POA: Diagnosis not present

## 2021-10-26 DIAGNOSIS — N1339 Other hydronephrosis: Secondary | ICD-10-CM | POA: Diagnosis not present

## 2021-10-26 DIAGNOSIS — N201 Calculus of ureter: Secondary | ICD-10-CM | POA: Diagnosis not present

## 2021-10-26 DIAGNOSIS — Z9012 Acquired absence of left breast and nipple: Secondary | ICD-10-CM | POA: Diagnosis not present

## 2021-10-26 DIAGNOSIS — Z833 Family history of diabetes mellitus: Secondary | ICD-10-CM

## 2021-10-26 DIAGNOSIS — N136 Pyonephrosis: Secondary | ICD-10-CM | POA: Diagnosis not present

## 2021-10-26 DIAGNOSIS — N202 Calculus of kidney with calculus of ureter: Secondary | ICD-10-CM | POA: Diagnosis not present

## 2021-10-26 DIAGNOSIS — N3000 Acute cystitis without hematuria: Secondary | ICD-10-CM | POA: Diagnosis not present

## 2021-10-26 DIAGNOSIS — N132 Hydronephrosis with renal and ureteral calculous obstruction: Secondary | ICD-10-CM | POA: Diagnosis not present

## 2021-10-26 DIAGNOSIS — Z87891 Personal history of nicotine dependence: Secondary | ICD-10-CM | POA: Diagnosis not present

## 2021-10-26 DIAGNOSIS — I480 Paroxysmal atrial fibrillation: Secondary | ICD-10-CM | POA: Diagnosis not present

## 2021-10-26 DIAGNOSIS — N135 Crossing vessel and stricture of ureter without hydronephrosis: Secondary | ICD-10-CM | POA: Diagnosis not present

## 2021-10-26 LAB — URINALYSIS, ROUTINE W REFLEX MICROSCOPIC
Bilirubin Urine: NEGATIVE
Glucose, UA: 150 mg/dL — AB
Ketones, ur: 5 mg/dL — AB
Nitrite: NEGATIVE
Protein, ur: 30 mg/dL — AB
Specific Gravity, Urine: 1.017 (ref 1.005–1.030)
pH: 5 (ref 5.0–8.0)

## 2021-10-26 LAB — BASIC METABOLIC PANEL
Anion gap: 18 — ABNORMAL HIGH (ref 5–15)
BUN: 19 mg/dL (ref 8–23)
CO2: 25 mmol/L (ref 22–32)
Calcium: 9.4 mg/dL (ref 8.9–10.3)
Chloride: 90 mmol/L — ABNORMAL LOW (ref 98–111)
Creatinine, Ser: 0.61 mg/dL (ref 0.44–1.00)
GFR, Estimated: >60 mL/min (ref >60)
Glucose, Bld: 256 mg/dL — ABNORMAL HIGH (ref 70–99)
Potassium: 3.5 mmol/L (ref 3.5–5.1)
Sodium: 133 mmol/L — ABNORMAL LOW (ref 135–145)

## 2021-10-26 LAB — CBC
HCT: 34 % — ABNORMAL LOW (ref 36.0–46.0)
Hemoglobin: 11.5 g/dL — ABNORMAL LOW (ref 12.0–15.0)
MCH: 29.7 pg (ref 26.0–34.0)
MCHC: 33.8 g/dL (ref 30.0–36.0)
MCV: 87.9 fL (ref 80.0–100.0)
Platelets: 463 10*3/uL — ABNORMAL HIGH (ref 150–400)
RBC: 3.87 MIL/uL (ref 3.87–5.11)
RDW: 15.5 % (ref 11.5–15.5)
WBC: 18.5 10*3/uL — ABNORMAL HIGH (ref 4.0–10.5)
nRBC: 0 % (ref 0.0–0.2)

## 2021-10-26 MED ORDER — DIPHENHYDRAMINE HCL 50 MG/ML IJ SOLN: 25.0000 mg | Freq: Three times a day (TID) | INTRAMUSCULAR | Status: DC | PRN

## 2021-10-26 MED ORDER — KETOROLAC TROMETHAMINE 30 MG/ML IJ SOLN
15.0000 mg | Freq: Once | INTRAMUSCULAR | Status: AC
  Administered 2021-10-27: 15 mg via INTRAVENOUS
  Filled 2021-10-26: qty 1

## 2021-10-26 MED ORDER — LACTATED RINGERS IV BOLUS
1000.0000 mL | Freq: Once | INTRAVENOUS | Status: AC
  Administered 2021-10-27: 1000 mL via INTRAVENOUS

## 2021-10-26 MED ORDER — ONDANSETRON HCL 4 MG/2ML IJ SOLN
4.0000 mg | Freq: Once | INTRAMUSCULAR | Status: AC
  Administered 2021-10-27: 4 mg via INTRAVENOUS
  Filled 2021-10-26: qty 2

## 2021-10-26 MED ORDER — ONDANSETRON 4 MG PO TBDP
8.0000 mg | ORAL_TABLET | Freq: Once | ORAL | Status: AC
  Administered 2021-10-26: 8 mg via ORAL
  Filled 2021-10-26: qty 2

## 2021-10-26 MED ORDER — MORPHINE SULFATE (PF) 4 MG/ML IV SOLN
6.0000 mg | Freq: Once | INTRAVENOUS | Status: DC
  Filled 2021-10-26: qty 2

## 2021-10-26 MED ORDER — SODIUM CHLORIDE 0.9 % IV SOLN
2.0000 g | Freq: Once | INTRAVENOUS | Status: AC
  Administered 2021-10-27: 2 g via INTRAVENOUS
  Filled 2021-10-26: qty 20

## 2021-10-26 NOTE — ED Provider Notes (Signed)
[facility] EMERGENCY DEPARTMENT Provider Note   CSN: [id number] Arrival date & time: 10/26/21  1809     History  Chief Complaint  Patient presents with   Flank Pain    [name] is a 71 y.o. female.  71 yo F w/ h/o kidney stones presents to the ED tonight with right sided flank and back pain c/w previous kidney stones. Started today around 1. Not better with home Rx meds. No fever. No urinary symptoms.    Flank Pain       Home Medications Prior to Admission medications   Medication Sig Start Date End Date Taking? Authorizing Provider  abatacept (ORENCIA) 250 MG injection Inject into the vein every 30 (thirty) days.    [provider]  buPROPion (WELLBUTRIN XL) 150 MG 24 hr tablet Take 150 mg by mouth every morning. 01/31/20   [provider]  denosumab (PROLIA) 60 MG/ML SOSY injection Inject 60 mg into the skin every 6 (six) months.    [provider]  diltiazem (TIAZAC) 360 MG 24 hr capsule Take 1 capsule (360 mg total) by mouth daily. 09/30/21 12/29/21  Tolia, Sunit, DO  escitalopram (LEXAPRO) 10 MG tablet Take 10 mg by mouth daily.    [provider]  esomeprazole (NEXIUM) 20 MG capsule Take 40 mg by mouth daily.     [provider]  folic acid (FOLVITE) 1 MG tablet Take 2 mg by mouth daily.    [provider]  hydrochlorothiazide (HYDRODIURIL) 25 MG tablet TAKE 1 TABLET BY MOUTH EVERY DAY IN THE MORNING 09/30/21   Tolia, Sunit, DO  HYDROcodone-acetaminophen (NORCO) 10-325 MG tablet Take 1 tablet by mouth every 6 (six) hours as needed (For pain.).    [provider]  hydrocortisone (ANUSOL-HC) 25 MG suppository Place 25 mg rectally 2 (two) times daily as needed for hemorrhoids.     [provider]  hydroxychloroquine (PLAQUENIL) 200 MG tablet Take 200 mg by mouth daily.    [provider]  levothyroxine (SYNTHROID) 175 MCG tablet Take 1 tablet by mouth daily. 03/18/21    [provider]  losartan (COZAAR) 50 MG tablet TAKE 1 TABLET BY MOUTH EVERY DAY 10/22/21   Tolia, Sunit, DO  metFORMIN (GLUCOPHAGE-XR) 500 MG 24 hr tablet Take 4 tablets by mouth daily.    [provider]  morphine (MS CONTIN) 30 MG 12 hr tablet Take 30 mg by mouth every 12 (twelve) hours. 07/06/20   [provider]  Multiple Vitamin (MULTIVITAMIN WITH MINERALS) TABS tablet Take 1 tablet by mouth daily.    [provider]  pravastatin (PRAVACHOL) 40 MG tablet Take 40 mg by mouth daily.    [provider]  warfarin (COUMADIN) 5 MG tablet Take 1 tablet (5 mg total) by mouth daily. 09/30/21   Tolia, Sunit, DO      Allergies    Fish allergy, Amaryl [glimepiride], Avalide [irbesartan-hydrochlorothiazide], Avandia [rosiglitazone], Byetta 10 mcg pen [exenatide], Codeine, Demerol [meperidine hcl], Glucovance [glyburide-metformin], Iodinated contrast media, Lisinopril, Naprosyn [naproxen], and Penicillins    Review of Systems   Review of Systems  Genitourinary:  Positive for flank pain.    Physical Exam Updated Vital Signs BP (!) 191/93   Pulse (!) 121   Temp 98.1 F (36.7 C) (Oral)   Resp 16   Ht '5\' 3"'$  (1.6 m)   Wt 50.8 kg   SpO2 (!) 88%   BMI 19.84 kg/m  Physical Exam Vitals and nursing note reviewed.  HENT:     Mouth/Throat:     Mouth: Mucous membranes are moist.     Pharynx: Oropharynx is clear.  Eyes:     Pupils: Pupils are equal, round, and reactive to light.  Abdominal:     General: Abdomen is flat.  Musculoskeletal:        General: Tenderness (mild right cva) present. No swelling. Normal range of motion.  Skin:    General: Skin is warm and dry.  Neurological:     General: No focal deficit present.     ED Results / Procedures / Treatments   Labs (all labs ordered are listed, but only abnormal results are displayed) Labs Reviewed  URINALYSIS, ROUTINE W REFLEX MICROSCOPIC - Abnormal; Notable for the following components:       Result Value   APPearance HAZY (*)    Glucose, UA 150 (*)    Hgb urine dipstick MODERATE (*)    Ketones, ur 5 (*)    Protein, ur 30 (*)    Leukocytes,Ua SMALL (*)    Bacteria, UA MANY (*)    All other components within normal limits  CBC - Abnormal; Notable for the following components:   WBC 18.5 (*)    Hemoglobin 11.5 (*)    HCT 34.0 (*)    Platelets 463 (*)    All other components within normal limits  BASIC METABOLIC PANEL - Abnormal; Notable for the following components:   Sodium 133 (*)    Chloride 90 (*)    Glucose, Bld 256 (*)    Anion gap 18 (*)    All other components within normal limits    EKG None  Radiology CT Renal Stone Study  Result Date: 10/26/2021 CLINICAL DATA:  Right flank pain EXAM: CT ABDOMEN AND PELVIS WITHOUT CONTRAST TECHNIQUE: Multidetector CT imaging of the abdomen and pelvis was performed following the standard protocol without IV contrast. RADIATION DOSE REDUCTION: This exam was performed according to the departmental dose-optimization program which includes automated exposure control, adjustment of the mA and/or kV according to patient size and/or use of iterative reconstruction technique. COMPARISON:  08/18/2020 FINDINGS: Lower chest: No acute abnormality. Densely calcified mitral valve annulus. Aortic atherosclerosis. Hepatobiliary: No focal liver abnormality is seen. Status post cholecystectomy. No biliary dilatation. Pancreas: No focal abnormality or ductal dilatation. Spleen: No focal abnormality.  Normal size. Adrenals/Urinary Tract: Adrenal glands are unremarkable. Small exophytic cyst off the upper pole of the left kidney, stable. No follow-up imaging recommended. Mild right hydronephrosis due to 5 mm proximal right ureteral stone. No hydronephrosis or stones on the left. Urinary bladder unremarkable. Stomach/Bowel: Stomach, large and small bowel grossly unremarkable. Vascular/Lymphatic: Aortic atherosclerosis. No evidence of aneurysm or adenopathy.  Reproductive: Prior hysterectomy.  No adnexal masses. Other: No free fluid or free air. Musculoskeletal: No acute bony abnormality. Postoperative changes in the lower lumbar spine. IMPRESSION: 5 mm proximal right ureteral stone with mild right hydronephrosis. Aortic atherosclerosis. Electronically Signed   By: Rolm Baptise M.D.   On: 10/26/2021 22:04    Procedures .Critical Care  Performed by: Merrily Pew, MD Authorized by: Merrily Pew, MD   Critical care provider statement:    Critical care time (minutes):  30   Critical care was necessary to treat or prevent imminent or life-threatening deterioration of the following conditions:  Sepsis   Critical care was time spent personally by me on the following activities:  Development of treatment plan with patient or surrogate, discussions with consultants, evaluation of patient's response to  treatment, examination of patient, ordering and review of laboratory studies, ordering and review of radiographic studies, ordering and performing treatments and interventions, pulse oximetry, re-evaluation of patient's condition and review of old charts     Medications Ordered in ED Medications  ondansetron (ZOFRAN-ODT) disintegrating tablet 8 mg (8 mg Oral Given 10/26/21 2019)    ED Course/ Medical Decision Making/ A&P                           Medical Decision Making Amount and/or Complexity of Data Reviewed Labs: ordered.  Risk Prescription drug management. Decision regarding hospitalization.  Ct with kidney stone. UA potentially infected, rocephin started culture added. Slightly tachy and has a leukocytosis so will add on lactic acid and give some fluids. Difficult to tell if this is stress demargnination and tachycardia both from acute pain or something different.  Discussed with Dr. Bess Harvest.  We will take the patient for stent request hospitalist admission.  Discussed with hospitalist for same  Final Clinical Impression(s) / ED Diagnoses Final  diagnoses:  None    Rx / DC Orders ED Discharge Orders     None         Dmarion Perfect, Corene Cornea, MD 10/28/21 [phone]

## 2021-10-27 ENCOUNTER — Emergency Department (HOSPITAL_COMMUNITY): Payer: Medicare Other

## 2021-10-27 ENCOUNTER — Inpatient Hospital Stay (HOSPITAL_COMMUNITY): Payer: Medicare Other

## 2021-10-27 ENCOUNTER — Encounter (HOSPITAL_COMMUNITY)
Admission: EM | Disposition: A | Discharge status: Home / Self Care | Payer: Self-pay | Source: Home / Self Care | Attending: Internal Medicine

## 2021-10-27 ENCOUNTER — Emergency Department (EMERGENCY_DEPARTMENT_HOSPITAL): Payer: Medicare Other | Admitting: Anesthesiology

## 2021-10-27 ENCOUNTER — Encounter (HOSPITAL_COMMUNITY): Payer: Self-pay | Admitting: Anesthesiology

## 2021-10-27 ENCOUNTER — Emergency Department (HOSPITAL_COMMUNITY): Payer: Medicare Other | Admitting: Anesthesiology

## 2021-10-27 DIAGNOSIS — Z833 Family history of diabetes mellitus: Secondary | ICD-10-CM | POA: Diagnosis not present

## 2021-10-27 DIAGNOSIS — N202 Calculus of kidney with calculus of ureter: Secondary | ICD-10-CM

## 2021-10-27 DIAGNOSIS — Z808 Family history of malignant neoplasm of other organs or systems: Secondary | ICD-10-CM | POA: Diagnosis not present

## 2021-10-27 DIAGNOSIS — E1165 Type 2 diabetes mellitus with hyperglycemia: Secondary | ICD-10-CM | POA: Diagnosis present

## 2021-10-27 DIAGNOSIS — Z87442 Personal history of urinary calculi: Secondary | ICD-10-CM | POA: Diagnosis not present

## 2021-10-27 DIAGNOSIS — Z903 Acquired absence of stomach [part of]: Secondary | ICD-10-CM | POA: Diagnosis not present

## 2021-10-27 DIAGNOSIS — G894 Chronic pain syndrome: Secondary | ICD-10-CM | POA: Diagnosis present

## 2021-10-27 DIAGNOSIS — G8929 Other chronic pain: Secondary | ICD-10-CM | POA: Diagnosis present

## 2021-10-27 DIAGNOSIS — E876 Hypokalemia: Secondary | ICD-10-CM | POA: Diagnosis present

## 2021-10-27 DIAGNOSIS — M069 Rheumatoid arthritis, unspecified: Secondary | ICD-10-CM | POA: Diagnosis present

## 2021-10-27 DIAGNOSIS — Z87891 Personal history of nicotine dependence: Secondary | ICD-10-CM | POA: Diagnosis not present

## 2021-10-27 DIAGNOSIS — E119 Type 2 diabetes mellitus without complications: Secondary | ICD-10-CM | POA: Diagnosis not present

## 2021-10-27 DIAGNOSIS — I10 Essential (primary) hypertension: Secondary | ICD-10-CM | POA: Diagnosis present

## 2021-10-27 DIAGNOSIS — I480 Paroxysmal atrial fibrillation: Secondary | ICD-10-CM | POA: Diagnosis present

## 2021-10-27 DIAGNOSIS — N3000 Acute cystitis without hematuria: Secondary | ICD-10-CM | POA: Diagnosis not present

## 2021-10-27 DIAGNOSIS — Z90710 Acquired absence of both cervix and uterus: Secondary | ICD-10-CM | POA: Diagnosis not present

## 2021-10-27 DIAGNOSIS — E785 Hyperlipidemia, unspecified: Secondary | ICD-10-CM | POA: Diagnosis present

## 2021-10-27 DIAGNOSIS — A419 Sepsis, unspecified organism: Secondary | ICD-10-CM

## 2021-10-27 DIAGNOSIS — M810 Age-related osteoporosis without current pathological fracture: Secondary | ICD-10-CM | POA: Diagnosis present

## 2021-10-27 DIAGNOSIS — I4891 Unspecified atrial fibrillation: Secondary | ICD-10-CM

## 2021-10-27 DIAGNOSIS — Z79899 Other long term (current) drug therapy: Secondary | ICD-10-CM | POA: Diagnosis not present

## 2021-10-27 DIAGNOSIS — J45909 Unspecified asthma, uncomplicated: Secondary | ICD-10-CM | POA: Diagnosis present

## 2021-10-27 DIAGNOSIS — N135 Crossing vessel and stricture of ureter without hydronephrosis: Secondary | ICD-10-CM | POA: Diagnosis not present

## 2021-10-27 DIAGNOSIS — Z853 Personal history of malignant neoplasm of breast: Secondary | ICD-10-CM | POA: Diagnosis not present

## 2021-10-27 DIAGNOSIS — N136 Pyonephrosis: Secondary | ICD-10-CM

## 2021-10-27 DIAGNOSIS — R8271 Bacteriuria: Secondary | ICD-10-CM | POA: Diagnosis not present

## 2021-10-27 DIAGNOSIS — Z9882 Breast implant status: Secondary | ICD-10-CM | POA: Diagnosis not present

## 2021-10-27 DIAGNOSIS — E1169 Type 2 diabetes mellitus with other specified complication: Secondary | ICD-10-CM

## 2021-10-27 DIAGNOSIS — Z8249 Family history of ischemic heart disease and other diseases of the circulatory system: Secondary | ICD-10-CM | POA: Diagnosis not present

## 2021-10-27 DIAGNOSIS — N201 Calculus of ureter: Secondary | ICD-10-CM | POA: Diagnosis not present

## 2021-10-27 DIAGNOSIS — E039 Hypothyroidism, unspecified: Secondary | ICD-10-CM | POA: Diagnosis present

## 2021-10-27 DIAGNOSIS — E872 Acidosis, unspecified: Secondary | ICD-10-CM | POA: Diagnosis present

## 2021-10-27 DIAGNOSIS — N1339 Other hydronephrosis: Secondary | ICD-10-CM | POA: Diagnosis not present

## 2021-10-27 DIAGNOSIS — N200 Calculus of kidney: Secondary | ICD-10-CM

## 2021-10-27 DIAGNOSIS — Z9012 Acquired absence of left breast and nipple: Secondary | ICD-10-CM | POA: Diagnosis not present

## 2021-10-27 HISTORY — PX: CYSTOSCOPY WITH STENT PLACEMENT: SHX5790

## 2021-10-27 HISTORY — PX: CYSTOSCOPY W/ URETERAL STENT PLACEMENT: SHX1429

## 2021-10-27 LAB — COMPREHENSIVE METABOLIC PANEL
ALT: 21 U/L (ref 0–44)
AST: 28 U/L (ref 15–41)
Albumin: 2.7 g/dL — ABNORMAL LOW (ref 3.5–5.0)
Alkaline Phosphatase: 71 U/L (ref 38–126)
Anion gap: 18 — ABNORMAL HIGH (ref 5–15)
BUN: 20 mg/dL (ref 8–23)
CO2: 26 mmol/L (ref 22–32)
Calcium: 8.4 mg/dL — ABNORMAL LOW (ref 8.9–10.3)
Chloride: 92 mmol/L — ABNORMAL LOW (ref 98–111)
Creatinine, Ser: 0.87 mg/dL (ref 0.44–1.00)
GFR, Estimated: >60 mL/min (ref >60)
Glucose, Bld: 265 mg/dL — ABNORMAL HIGH (ref 70–99)
Potassium: 3.2 mmol/L — ABNORMAL LOW (ref 3.5–5.1)
Sodium: 136 mmol/L (ref 135–145)
Total Bilirubin: 0.7 mg/dL (ref 0.3–1.2)
Total Protein: 5.8 g/dL — ABNORMAL LOW (ref 6.5–8.1)

## 2021-10-27 LAB — CBC
HCT: 27.4 % — ABNORMAL LOW (ref 36.0–46.0)
Hemoglobin: 9.3 g/dL — ABNORMAL LOW (ref 12.0–15.0)
MCH: 30 pg (ref 26.0–34.0)
MCHC: 33.9 g/dL (ref 30.0–36.0)
MCV: 88.4 fL (ref 80.0–100.0)
Platelets: 331 10*3/uL (ref 150–400)
RBC: 3.1 MIL/uL — ABNORMAL LOW (ref 3.87–5.11)
RDW: 15.9 % — ABNORMAL HIGH (ref 11.5–15.5)
WBC: 45.1 10*3/uL — ABNORMAL HIGH (ref 4.0–10.5)
nRBC: 0 % (ref 0.0–0.2)

## 2021-10-27 LAB — PROTIME-INR
INR: 3.2 — ABNORMAL HIGH (ref 0.8–1.2)
Prothrombin Time: 32.1 seconds — ABNORMAL HIGH (ref 11.4–15.2)

## 2021-10-27 LAB — GLUCOSE, CAPILLARY
Glucose-Capillary: 162 mg/dL — ABNORMAL HIGH (ref 70–99)
Glucose-Capillary: 209 mg/dL — ABNORMAL HIGH (ref 70–99)
Glucose-Capillary: 245 mg/dL — ABNORMAL HIGH (ref 70–99)
Glucose-Capillary: 247 mg/dL — ABNORMAL HIGH (ref 70–99)
Glucose-Capillary: 271 mg/dL — ABNORMAL HIGH (ref 70–99)
Glucose-Capillary: 276 mg/dL — ABNORMAL HIGH (ref 70–99)

## 2021-10-27 LAB — LACTIC ACID, PLASMA: Lactic Acid, Venous: 2.8 mmol/L (ref 0.5–1.9)

## 2021-10-27 LAB — CORTISOL-AM, BLOOD: Cortisol - AM: 50 ug/dL — ABNORMAL HIGH (ref 6.7–22.6)

## 2021-10-27 LAB — PROCALCITONIN: Procalcitonin: 27.87 ng/mL

## 2021-10-27 SURGERY — CYSTOSCOPY, WITH RETROGRADE PYELOGRAM AND URETERAL STENT INSERTION
Anesthesia: General | Site: Ureter | Laterality: Right

## 2021-10-27 MED ORDER — PANTOPRAZOLE SODIUM 40 MG PO TBEC
80.0000 mg | DELAYED_RELEASE_TABLET | Freq: Every evening | ORAL | Status: DC
  Administered 2021-10-27 – 2021-10-29 (×3): 80 mg via ORAL
  Filled 2021-10-27 (×3): qty 2

## 2021-10-27 MED ORDER — PRAVASTATIN SODIUM 40 MG PO TABS
40.0000 mg | ORAL_TABLET | Freq: Every evening | ORAL | Status: DC
  Administered 2021-10-27 – 2021-10-29 (×3): 40 mg via ORAL
  Filled 2021-10-27 (×3): qty 1

## 2021-10-27 MED ORDER — ALBUMIN HUMAN 5 % IV SOLN
INTRAVENOUS | Status: AC
  Administered 2021-10-27: 12.5 g
  Filled 2021-10-27: qty 250

## 2021-10-27 MED ORDER — LEVOTHYROXINE SODIUM 75 MCG PO TABS
175.0000 ug | ORAL_TABLET | Freq: Every evening | ORAL | Status: DC
  Administered 2021-10-27 – 2021-10-29 (×3): 175 ug via ORAL
  Filled 2021-10-27 (×3): qty 1

## 2021-10-27 MED ORDER — LACTATED RINGERS IV SOLN: INTRAVENOUS | Status: DC | PRN

## 2021-10-27 MED ORDER — MORPHINE SULFATE (PF) 4 MG/ML IV SOLN: 4.0000 mg | INTRAVENOUS | Status: DC | PRN

## 2021-10-27 MED ORDER — AMISULPRIDE (ANTIEMETIC) 5 MG/2ML IV SOLN: 10.0000 mg | Freq: Once | INTRAVENOUS | Status: DC | PRN

## 2021-10-27 MED ORDER — DEXAMETHASONE SODIUM PHOSPHATE 4 MG/ML IJ SOLN
INTRAMUSCULAR | Status: DC | PRN
  Administered 2021-10-27: 5 mg via INTRAVENOUS

## 2021-10-27 MED ORDER — SODIUM CHLORIDE 0.9 % IV SOLN
2.0000 g | INTRAVENOUS | Status: DC
  Administered 2021-10-27 – 2021-10-29 (×3): 2 g via INTRAVENOUS
  Filled 2021-10-27 (×3): qty 20

## 2021-10-27 MED ORDER — FENTANYL CITRATE (PF) 100 MCG/2ML IJ SOLN
INTRAMUSCULAR | Status: DC | PRN
  Administered 2021-10-27: 50 ug via INTRAVENOUS

## 2021-10-27 MED ORDER — BUPROPION HCL ER (XL) 150 MG PO TB24
150.0000 mg | ORAL_TABLET | Freq: Every evening | ORAL | Status: DC
  Administered 2021-10-27 – 2021-10-29 (×3): 150 mg via ORAL
  Filled 2021-10-27 (×3): qty 1

## 2021-10-27 MED ORDER — ACETAMINOPHEN 10 MG/ML IV SOLN
INTRAVENOUS | Status: DC | PRN
  Administered 2021-10-27: 1000 mg via INTRAVENOUS

## 2021-10-27 MED ORDER — DILTIAZEM HCL ER COATED BEADS 240 MG PO CP24
360.0000 mg | ORAL_CAPSULE | Freq: Every day | ORAL | Status: DC
  Administered 2021-10-27 – 2021-10-30 (×4): 360 mg via ORAL
  Filled 2021-10-27 (×4): qty 1

## 2021-10-27 MED ORDER — IOHEXOL 300 MG/ML  SOLN
INTRAMUSCULAR | Status: DC | PRN
  Administered 2021-10-27: 10 mL via URETHRAL

## 2021-10-27 MED ORDER — ACETAMINOPHEN 650 MG RE SUPP: 650.0000 mg | Freq: Four times a day (QID) | RECTAL | Status: DC | PRN

## 2021-10-27 MED ORDER — ALBUMIN HUMAN 5 % IV SOLN: 12.5000 g | Freq: Once | INTRAVENOUS | Status: AC

## 2021-10-27 MED ORDER — ACETAMINOPHEN 10 MG/ML IV SOLN
INTRAVENOUS | Status: AC
  Filled 2021-10-27: qty 100

## 2021-10-27 MED ORDER — FENTANYL CITRATE (PF) 100 MCG/2ML IJ SOLN: 25.0000 ug | INTRAMUSCULAR | Status: DC | PRN

## 2021-10-27 MED ORDER — ACETAMINOPHEN 325 MG PO TABS
650.0000 mg | ORAL_TABLET | Freq: Four times a day (QID) | ORAL | Status: DC | PRN
  Filled 2021-10-27: qty 2

## 2021-10-27 MED ORDER — FLAVOXATE HCL 100 MG PO TABS
100.0000 mg | ORAL_TABLET | Freq: Three times a day (TID) | ORAL | Status: DC | PRN
  Filled 2021-10-27: qty 1

## 2021-10-27 MED ORDER — ONDANSETRON HCL 4 MG/2ML IJ SOLN
4.0000 mg | Freq: Four times a day (QID) | INTRAMUSCULAR | Status: DC | PRN
  Administered 2021-10-28: 4 mg via INTRAVENOUS
  Filled 2021-10-27: qty 2

## 2021-10-27 MED ORDER — HYDROCODONE-ACETAMINOPHEN 10-325 MG PO TABS
1.0000 | ORAL_TABLET | Freq: Four times a day (QID) | ORAL | Status: DC | PRN
  Administered 2021-10-27 – 2021-10-30 (×8): 1 via ORAL
  Filled 2021-10-27 (×8): qty 1

## 2021-10-27 MED ORDER — PHENYLEPHRINE HCL-NACL 20-0.9 MG/250ML-% IV SOLN
INTRAVENOUS | Status: DC | PRN
  Administered 2021-10-27: 50 ug/min via INTRAVENOUS

## 2021-10-27 MED ORDER — WATER FOR IRRIGATION, STERILE IR SOLN
Status: DC | PRN
  Administered 2021-10-27: 3000 mL via INTRAVESICAL

## 2021-10-27 MED ORDER — WARFARIN - PHARMACIST DOSING INPATIENT: Freq: Every day | Status: DC

## 2021-10-27 MED ORDER — FENTANYL CITRATE (PF) 250 MCG/5ML IJ SOLN
INTRAMUSCULAR | Status: AC
  Filled 2021-10-27: qty 5

## 2021-10-27 MED ORDER — ALBUMIN HUMAN 5 % IV SOLN: INTRAVENOUS | Status: DC | PRN

## 2021-10-27 MED ORDER — LACTATED RINGERS IV BOLUS
1000.0000 mL | Freq: Once | INTRAVENOUS | Status: AC
  Administered 2021-10-27: 1000 mL via INTRAVENOUS

## 2021-10-27 MED ORDER — PROPOFOL 10 MG/ML IV BOLUS
INTRAVENOUS | Status: DC | PRN
  Administered 2021-10-27: 70 mg via INTRAVENOUS

## 2021-10-27 MED ORDER — INSULIN ASPART 100 UNIT/ML IJ SOLN
0.0000 [IU] | INTRAMUSCULAR | Status: DC
  Administered 2021-10-27: 5 [IU] via SUBCUTANEOUS
  Administered 2021-10-27: 2 [IU] via SUBCUTANEOUS
  Administered 2021-10-27: 5 [IU] via SUBCUTANEOUS
  Administered 2021-10-27 – 2021-10-28 (×3): 3 [IU] via SUBCUTANEOUS
  Administered 2021-10-28: 2 [IU] via SUBCUTANEOUS
  Administered 2021-10-28: 1 [IU] via SUBCUTANEOUS
  Administered 2021-10-28: 7 [IU] via SUBCUTANEOUS
  Administered 2021-10-28 – 2021-10-29 (×2): 2 [IU] via SUBCUTANEOUS
  Administered 2021-10-29: 3 [IU] via SUBCUTANEOUS
  Administered 2021-10-29 (×4): 2 [IU] via SUBCUTANEOUS
  Administered 2021-10-30 (×2): 3 [IU] via SUBCUTANEOUS
  Administered 2021-10-30: 2 [IU] via SUBCUTANEOUS

## 2021-10-27 MED ORDER — POTASSIUM CHLORIDE CRYS ER 20 MEQ PO TBCR
40.0000 meq | EXTENDED_RELEASE_TABLET | Freq: Once | ORAL | Status: AC
  Administered 2021-10-27: 40 meq via ORAL
  Filled 2021-10-27: qty 2

## 2021-10-27 MED ORDER — ADULT MULTIVITAMIN W/MINERALS CH
1.0000 | ORAL_TABLET | Freq: Every evening | ORAL | Status: DC
  Administered 2021-10-27 – 2021-10-29 (×3): 1 via ORAL
  Filled 2021-10-27 (×3): qty 1

## 2021-10-27 MED ORDER — PROMETHAZINE HCL 25 MG/ML IJ SOLN: 6.2500 mg | INTRAMUSCULAR | Status: DC | PRN

## 2021-10-27 MED ORDER — ESCITALOPRAM OXALATE 10 MG PO TABS
10.0000 mg | ORAL_TABLET | Freq: Every evening | ORAL | Status: DC
  Administered 2021-10-27 – 2021-10-29 (×3): 10 mg via ORAL
  Filled 2021-10-27 (×3): qty 1

## 2021-10-27 MED ORDER — FOLIC ACID 1 MG PO TABS
2.0000 mg | ORAL_TABLET | Freq: Every evening | ORAL | Status: DC
  Administered 2021-10-27 – 2021-10-29 (×3): 2 mg via ORAL
  Filled 2021-10-27 (×3): qty 2

## 2021-10-27 MED ORDER — MORPHINE SULFATE ER 15 MG PO TBCR
30.0000 mg | EXTENDED_RELEASE_TABLET | Freq: Two times a day (BID) | ORAL | Status: DC
  Administered 2021-10-27 – 2021-10-30 (×7): 30 mg via ORAL
  Filled 2021-10-27 (×7): qty 2

## 2021-10-27 MED ORDER — MIDAZOLAM HCL 2 MG/2ML IJ SOLN
INTRAMUSCULAR | Status: AC
  Filled 2021-10-27: qty 2

## 2021-10-27 MED ORDER — SUCCINYLCHOLINE CHLORIDE 20 MG/ML IJ SOLN
INTRAMUSCULAR | Status: DC | PRN
  Administered 2021-10-27: 120 mg via INTRAVENOUS

## 2021-10-27 MED ORDER — ONDANSETRON HCL 4 MG PO TABS
4.0000 mg | ORAL_TABLET | Freq: Four times a day (QID) | ORAL | Status: DC | PRN
  Administered 2021-10-27: 4 mg via ORAL
  Filled 2021-10-27 (×2): qty 1

## 2021-10-27 MED ORDER — LIDOCAINE HCL (CARDIAC) PF 50 MG/5ML IV SOSY
PREFILLED_SYRINGE | INTRAVENOUS | Status: DC | PRN
  Administered 2021-10-27: 100 mg via INTRAVENOUS

## 2021-10-27 SURGICAL SUPPLY — 23 items
BAG DRN RND TRDRP ANRFLXCHMBR (UROLOGICAL SUPPLIES) ×1
BAG URINE DRAIN 2000ML AR STRL (UROLOGICAL SUPPLIES) ×3 IMPLANT
BAG URO CATCHER STRL LF (MISCELLANEOUS) ×3 IMPLANT
CATH FOLEY 2WAY SLVR  5CC 16FR (CATHETERS) ×2
CATH FOLEY 2WAY SLVR 5CC 16FR (CATHETERS) IMPLANT
CATH INTERMIT  6FR 70CM (CATHETERS) ×3 IMPLANT
GLOVE BIOGEL M 8.0 STRL (GLOVE) ×3 IMPLANT
GOWN STRL REUS W/ TWL LRG LVL3 (GOWN DISPOSABLE) ×2 IMPLANT
GOWN STRL REUS W/ TWL XL LVL3 (GOWN DISPOSABLE) ×2 IMPLANT
GOWN STRL REUS W/TWL LRG LVL3 (GOWN DISPOSABLE) ×2
GOWN STRL REUS W/TWL XL LVL3 (GOWN DISPOSABLE) ×2
GUIDEWIRE ANG ZIPWIRE 038X150 (WIRE) ×1 IMPLANT
GUIDEWIRE STR DUAL SENSOR (WIRE) ×3 IMPLANT
KIT TURNOVER KIT B (KITS) ×3 IMPLANT
MANIFOLD NEPTUNE II (INSTRUMENTS) ×1 IMPLANT
NS IRRIG 1000ML POUR BTL (IV SOLUTION) IMPLANT
PACK CYSTO (CUSTOM PROCEDURE TRAY) ×3 IMPLANT
STENT URET 6FRX24 CONTOUR (STENTS) ×1 IMPLANT
STENT URET 6FRX26 CONTOUR (STENTS) IMPLANT
SYPHON OMNI JUG (MISCELLANEOUS) ×2 IMPLANT
TOWEL GREEN STERILE FF (TOWEL DISPOSABLE) ×3 IMPLANT
TUBE CONNECTING 12X1/4 (SUCTIONS) ×1 IMPLANT
WATER STERILE IRR 3000ML UROMA (IV SOLUTION) ×3 IMPLANT

## 2021-10-27 NOTE — Anesthesia Preprocedure Evaluation (Addendum)
Anesthesia Evaluation  Patient identified by MRN, date of birth, ID band Patient awake    Reviewed: Allergy & Precautions, NPO status , Patient's Chart, lab work & pertinent test results  History of Anesthesia Complications Negative for: history of anesthetic complications  Airway Mallampati: II  TM Distance: >3 FB Neck ROM: Full    Dental  (+) Dental Advisory Given, Poor Dentition   Pulmonary neg pulmonary ROS, former smoker,    breath sounds clear to auscultation       Cardiovascular hypertension, Pt. on medications + dysrhythmias Atrial Fibrillation + Valvular Problems/Murmurs MR, AS and AI  Rhythm:Regular Rate:Tachycardia + Systolic murmurs Echocardiogram 08/25/2021:  Normal LV systolic function with visual EF 55-60%. Left ventricle cavity  is small in size. Mild basal asymmetric hypertrophy of the left ventricle.  Mild concentric LVH. Normal global wall motion. Mild flattening of the  septum may be due to bundle branch block vs RV pressure overload.  Indeterminate diastolic filling pattern due to mitral apparatus  calcification, elevated LAP. Calculated EF 41%. Left atrial cavity is severely dilated at 4.9 cm. Mild calcification of the aortic valve annulus. Mildly restricted aortic  valve leaflets. Trileaflet aortic valve. Mild aortic stenosis. Moderate  (Grade II) aortic regurgitation. Peak velocity 2.53 m/s, Peak Pressure  Gradient 25.6 mmHg, Mean Gradient 15.6 mmHg, AVA 1.0 cm, Dimensionless  Index 0.38. Severe calcification of the mitral valve annulus. Mild mitral valve  leaflet calcification. Moderately restricted mitral valve leaflets.  Moderate mitral stenosis. Severe (Grade III) mitral regurgitation. Mitral  valve peak pressure gradient 26.6, mean pressure gradient 13.8 mmHg,  calculated mitral valve area by pressure half-time is 1.3 cm. Marked  elevation in the velocity is due to severe MR and flow. Structurally  normal tricuspid valve. Moderate tricuspid regurgitation.  Moderate pulmonary hypertension. RVSP measures 44 mmHg. Pericardium is normal. Insignificant pericardial effusion. IVC is normal with poor inspiration collapse consistent with elevated  right atrial pressure (37m Hg).   Neuro/Psych negative neurological ROS  negative psych ROS   GI/Hepatic negative GI ROS, Neg liver ROS, IBS   Endo/Other  diabetesHypothyroidism   Renal/GU   negative genitourinary   Musculoskeletal negative musculoskeletal ROS (+)   Abdominal   Peds negative pediatric ROS (+)  Hematology  (+) Blood dyscrasia, anemia ,   Anesthesia Other Findings   Reproductive/Obstetrics negative OB ROS                            Anesthesia Physical Anesthesia Plan  ASA: 3  Anesthesia Plan: General   Post-op Pain Management:    Induction: Intravenous  PONV Risk Score and Plan: 3 and Ondansetron, Dexamethasone and Treatment may vary due to age or medical condition  Airway Management Planned: LMA and Oral ETT  Additional Equipment:   Intra-op Plan:   Post-operative Plan: Extubation in OR  Informed Consent: I have reviewed the patients History and Physical, chart, labs and discussed the procedure including the risks, benefits and alternatives for the proposed anesthesia with the patient or authorized representative who has indicated his/her understanding and acceptance.     Dental advisory given  Plan Discussed with: Anesthesiologist  Anesthesia Plan Comments:         Anesthesia Quick Evaluation

## 2021-10-27 NOTE — Assessment & Plan Note (Signed)
Pt with UTI + 67m stone + sepsis presentation 1. Sepsis treatment below with IV and ABx 2. Stone treatment below with emergent ureteral stent.

## 2021-10-27 NOTE — H&P (Signed)
History and Physical    Patient: [name] JEH:[id number] DOB: 1950-06-20 DOA: 10/26/2021 DOS: the patient was seen and examined on 10/27/2021 PCP: Tammy Frees, MD  Patient coming from: Home  Chief Complaint:  Chief Complaint  Patient presents with   Flank Pain   HPI: [name] is a 71 y.o. female with medical history significant of a.fib on coumadin, RA, DM2, HTN, chronic back pain.  Pt has history of prior infected kidney stone (left side previously)  Pt presents to ED with c/o R sided flank and back pain c/w prior kidney stones.  Started at 1pm today.  Not better with home Rx meds.  No fever, no dysuria.  In ED: Pt actually found to be septic with tachycardia, WBC 18k.  UA is consistent with UTI.  CT AP does indeed show 46m R ureteral stone with hydronephrosis.   Review of Systems: As mentioned in the history of present illness. All other systems reviewed and are negative. Past Medical History:  Diagnosis Date   Asthma, mild    Intermittent   Atrial fibrillation ([facility]    Feb 2023   Can[facility] ([facility]    Chronic back pain    Echocardiogram abnormal 09/2008   With mild aortic valve and mitral valve regurgitation   Heart murmur    History of left breast cancer 1999   Hyperlipidemia    Hypertension    Hypothyroidism    IBS (irritable bowel syndrome)    Lactose intolerance    Osteoporosis    Peptic ulcer disease    Type II diabetes mellitus ([facility] 08[facility]   Ulcer, duodenal, acute, with obstruction 1999   Past Surgical History:  Procedure Laterality Date   ABDOMINAL HYSTERECTOMY     BREAST IMPLANT REMOVAL     Breast implant infected removed implant 06/06 infected and removed 11/05   BREAST LUMPECTOMY  09/99   Left breast   BREAST RECONSTRUCTION     CARDIOVERSION N/A 08/11/2021   Procedure: CARDIOVERSION;  Surgeon: TRex Kras DO;  [facility];  Service: Cardiovascular;  Laterality: N/A;   CHOLECYSTECTOMY     CYSTOSCOPY W/ URETERAL  STENT PLACEMENT Left 01/19/2018   Procedure: CYSTOSCOPY WITH RETROGRADE PYELOGRAM/URETERAL STENT PLACEMENT;  Surgeon: DFranchot Gallo MD;  Location: [facility];  Service: Urology;  Laterality: Left;   EXTRACORPOREAL SHOCK WAVE LITHOTRIPSY Left 02/15/2018   Procedure: LEFT EXTRACORPOREAL SHOCK WAVE LITHOTRIPSY (ESWL);  Surgeon: MCleon Gustin MD;  Location: [facility];  Service: Urology;  Laterality: Left;   MASTECTOMY  03/05   Left   other     Left Arm FX   OTHER SURGICAL HISTORY     Hysterectomy- Ovaries intact    OTHER SURGICAL HISTORY     Fracture Left HIp   PARTIAL GASTRECTOMY  1999   Social History:  reports that she quit smoking about 21 years ago. Her smoking use included cigarettes. She has never used smokeless tobacco. She reports that she does not drink alcohol and does not use drugs.  Allergies  Allergen Reactions   Fish Allergy Anaphylaxis and Hives   Amaryl [Glimepiride] Itching   Avalide [Irbesartan-Hydrochlorothiazide] Itching   Avandia [Rosiglitazone] Itching   Byetta 10 Mcg Pen [Exenatide] Itching   Codeine Itching    Other reaction(s): Unknown   Demerol [Meperidine Hcl] Itching   Glucovance [Glyburide-Metformin] Itching   Iodinated Contrast Media Itching    Other reaction(s): Unknown   Lisinopril Itching   Naprosyn [Naproxen] Hives   Penicillins Itching  and Other (See Comments)    Has patient had a PCN reaction causing immediate rash, facial/tongue/throat swelling, SOB or lightheadedness with hypotension: no Has patient had a PCN reaction causing severe rash involving mucus membranes or skin necrosis: no Has patient had a PCN reaction that required hospitalization: yes Has patient had a PCN reaction occurring within the last 10 years: no If all of the above answers are "NO", then may proceed with Cephalosporin use.    Family History  Problem Relation Age of Onset   Heart failure Mother    Osteoporosis Mother    Heart attack Father    Hypertension Father     Diabetes Father    Diabetes Mellitus II Father    Diabetes Sister    Thyroid cancer Sister    Diabetes Brother    Colon cancer Neg Hx    Esophageal cancer Neg Hx    Stomach cancer Neg Hx     Prior to Admission medications   Medication Sig Start Date End Date Taking? Authorizing Provider  abatacept (ORENCIA) 250 MG injection Inject 250 mg into the vein every 30 (thirty) days.   Yes [provider]  buPROPion (WELLBUTRIN XL) 150 MG 24 hr tablet Take 150 mg by mouth every evening. 01/31/20  Yes [provider]  denosumab (PROLIA) 60 MG/ML SOSY injection Inject 60 mg into the skin every 6 (six) months.   Yes [provider]  diltiazem (TIAZAC) 360 MG 24 hr capsule Take 1 capsule (360 mg total) by mouth daily. Patient taking differently: Take 360 mg by mouth every evening. 09/30/21 12/29/21 Yes Tolia, Sunit, DO  escitalopram (LEXAPRO) 10 MG tablet Take 10 mg by mouth every evening.   Yes [provider]  esomeprazole (NEXIUM) 20 MG capsule Take 40 mg by mouth every evening.   Yes [provider]  folic acid (FOLVITE) 1 MG tablet Take 2 mg by mouth every evening.   Yes [provider]  hydrochlorothiazide (HYDRODIURIL) 25 MG tablet TAKE 1 TABLET BY MOUTH EVERY DAY IN THE MORNING Patient taking differently: Take 25 mg by mouth every evening. 09/30/21  Yes Tolia, Sunit, DO  HYDROcodone-acetaminophen (NORCO) 10-325 MG tablet Take 1 tablet by mouth every 6 (six) hours as needed (For pain.).   Yes [provider]  hydrocortisone (ANUSOL-HC) 25 MG suppository Place 25 mg rectally 2 (two) times daily as needed for hemorrhoids.    Yes [provider]  hydroxychloroquine (PLAQUENIL) 200 MG tablet Take 200 mg by mouth every evening.   Yes [provider]  levothyroxine (SYNTHROID) 175 MCG tablet Take 175 mcg by mouth every evening. 03/18/21  Yes [provider]  losartan (COZAAR) 50 MG tablet TAKE 1 TABLET BY MOUTH EVERY  DAY Patient taking differently: Take 50 mg by mouth every evening. 10/22/21  Yes Tolia, Sunit, DO  metFORMIN (GLUCOPHAGE-XR) 500 MG 24 hr tablet Take 2,000 mg by mouth every evening.   Yes [provider]  morphine (MS CONTIN) 30 MG 12 hr tablet Take 30 mg by mouth every 12 (twelve) hours. 07/06/20  Yes [provider]  Multiple Vitamin (MULTIVITAMIN WITH MINERALS) TABS tablet Take 1 tablet by mouth every evening.   Yes [provider]  pravastatin (PRAVACHOL) 40 MG tablet Take 40 mg by mouth every evening.   Yes [provider]  warfarin (COUMADIN) 5 MG tablet Take 1 tablet (5 mg total) by mouth daily. Patient taking differently: Take 2.5 mg by mouth every evening. 09/30/21  Yes Tolia,  Sunit, DO    Physical Exam: Vitals:   10/26/21 1812 10/26/21 2100 10/27/21 0028 10/27/21 0234  BP: (!) 160/79 (!) 191/93 (!) 107/54 99/61  Pulse: (!) 107 (!) 121 (!) 127 (!) 117  Resp: '16 16 20 18  '$ Temp: 98.1 F (36.7 C)     TempSrc: Oral     SpO2: 94% (!) 88% 95% 98%  Weight: 50.8 kg     Height: '5\' 3"'$  (1.6 m)      Constitutional: NAD, calm, comfortable Eyes: PERRL, lids and conjunctivae normal ENMT: Mucous membranes are moist. Posterior pharynx clear of any exudate or lesions.Normal dentition.  Neck: normal, supple, no masses, no thyromegaly Respiratory: clear to auscultation bilaterally, no wheezing, no crackles. Normal respiratory effort. No accessory muscle use.  Cardiovascular: Regular rate and rhythm, no murmurs / rubs / gallops. No extremity edema. 2+ pedal pulses. No carotid bruits.  Abdomen: no tenderness, no masses palpated. No hepatosplenomegaly. Bowel sounds positive.  Musculoskeletal: no clubbing / cyanosis. No joint deformity upper and lower extremities. Good ROM, no contractures. Normal muscle tone.  Skin: no rashes, lesions, ulcers. No induration Neurologic: CN 2-12 grossly intact. Sensation intact, DTR normal. Strength 5/5 in all 4.  Psychiatric: Affect  flat  Data Reviewed:    CBC    Component Value Date/Time   WBC 18.5 (H) 10/26/2021 1815   RBC 3.87 10/26/2021 1815   HGB 11.5 (L) 10/26/2021 1815   HGB 11.1 06/29/2021 1421   HCT 34.0 (L) 10/26/2021 1815   HCT 33.5 (L) 06/29/2021 1421   PLT 463 (H) 10/26/2021 1815   MCV 87.9 10/26/2021 1815   MCH 29.7 10/26/2021 1815   MCHC 33.8 10/26/2021 1815   RDW 15.5 10/26/2021 1815   LYMPHSABS 1.6 04/17/2009 1310   MONOABS 0.4 04/17/2009 1310   EOSABS 0.0 04/17/2009 1310   BASOSABS 0.0 04/17/2009 1310      Latest Ref Rng & Units 10/26/2021    6:15 PM 08/11/2021   12:24 PM 06/29/2021    2:21 PM  BMP  Glucose 70 - 99 mg/dL 256  199  187   BUN 8 - 23 mg/dL '19  13  11   '$ Creatinine 0.44 - 1.00 mg/dL 0.61  0.40  0.78   BUN/Creat Ratio 12 - 28   14   Sodium 135 - 145 mmol/L 133  138  140   Potassium 3.5 - 5.1 mmol/L 3.5  3.8  3.9   Chloride 98 - 111 mmol/L 90  95  99   CO2 22 - 32 mmol/L 25   CANCELED   Calcium 8.9 - 10.3 mg/dL 9.4   8.9    Urinalysis    Component Value Date/Time   COLORURINE YELLOW 10/26/2021 1815   APPEARANCEUR HAZY (A) 10/26/2021 1815   LABSPEC 1.017 10/26/2021 1815   PHURINE 5.0 10/26/2021 1815   GLUCOSEU 150 (A) 10/26/2021 1815   HGBUR MODERATE (A) 10/26/2021 1815   BILIRUBINUR NEGATIVE 10/26/2021 1815   KETONESUR 5 (A) 10/26/2021 1815   PROTEINUR 30 (A) 10/26/2021 1815   NITRITE NEGATIVE 10/26/2021 1815   LEUKOCYTESUR SMALL (A) 10/26/2021 1815    CT AP = 70m obstructing R ureteral calculus with R hydronephrosis.  Assessment and Plan: * Pyohydronephrosis Pt with UTI + 571mstone + sepsis presentation Sepsis treatment below with IV and ABx Stone treatment below with emergent ureteral stent.  Kidney stone Dr. MaTresa Mooreonsulted.  Coming to place emergent ureteral stent now. Pt NPO for procedure  [facility]  [facility] sepsis at time of [facility]. Specifically the patient has at least 2 out of 4 SIRS criteria, namely: Tachycardia,  leukocytosis The currently suspected source of infection is pyohydronephrosis (infected kidney stone) Lactic acid: 2.8 Blood pressure: 99/61 IVF: 2L bolus then LR at 125 cc/hr Antibiotics: Rocephin Ordered urine and blood cultures (not clear if these were or wernt drawn prior to ABx though).  RA (rheumatoid arthritis) ([facility]) On Plaquinel and abatacept.  Abatacept not due till next week. Holding plaquinel in setting of severe infection.  HTN (hypertension) Pt borderline low BP in setting of sepsis from infected kidney stone. Holding home BP meds.  DM2 (diabetes mellitus, type 2) ([place]) Hold home metformin Sensitive SSI Q4H  Chronic pain Cont home MS contin scheduled and PRN norco  Atrial fibrillation ([place]) Holding home cardizem in setting of sepsis. Use short acting cardizem gtt if needed. Continue coumadin per pharm consult later this evening assuming she has no bleeding complications from the emergent procedure being done this morning. Currently tachy to 120: unclear if s.tach vs A.Fib RVR.  Getting EKG.      Advance Care Planning:   Code Status: Full Code  Consults: Dr. Tresa Moore, taking to OR now  Family Communication: No family in room  Severity of Illness: The appropriate patient status for this patient is INPATIENT. Inpatient status is judged to be reasonable and necessary in order to provide the required intensity of service to ensure the patient's safety. The patient's presenting symptoms, physical exam findings, and initial radiographic and laboratory data in the context of their chronic comorbidities is felt to place them at high risk for further clinical deterioration. Furthermore, it is not anticipated that the patient will be medically stable for discharge from the hospital within 2 midnights of admission.   * I certify that at the point of admission it is my clinical judgment that the patient will require inpatient hospital care spanning beyond 2 midnights from the  point of admission due to high intensity of service, high risk for further deterioration and high frequency of surveillance required.*  Author: Etta Quill., DO 10/27/2021 2:51 AM  For on call review [email or web address].

## 2021-10-27 NOTE — Anesthesia Postprocedure Evaluation (Signed)
Anesthesia Post Note  Patient: [name]  Procedure(s) Performed: CYSTOSCOPY WITH RETROGRADE PYELOGRAM/URETERAL STENT PLACEMENT (Right: Ureter)     Patient location during evaluation: PACU Anesthesia Type: General Level of consciousness: sedated Pain management: pain level controlled Vital Signs Assessment: post-procedure vital signs reviewed and stable Respiratory status: spontaneous breathing and respiratory function stable Cardiovascular status: stable Postop Assessment: no apparent nausea or vomiting Anesthetic complications: no   No notable events documented.                Lynelle Weiler DANIEL

## 2021-10-27 NOTE — Anesthesia Procedure Notes (Signed)
Procedure Name: Intubation Date/Time: 10/27/2021 3:13 AM  Performed by: Suzy Bouchard, CRNAPre-anesthesia Checklist: Patient identified, Emergency Drugs available, Suction available, Patient being monitored and Timeout performed Patient Re-evaluated:Patient Re-evaluated prior to induction Oxygen Delivery Method: Circle system utilized Preoxygenation: Pre-oxygenation with 100% oxygen Induction Type: IV induction, Rapid sequence and Cricoid Pressure applied Laryngoscope Size: Miller and 2 Grade View: Grade II Tube type: Oral Tube size: 7.0 mm Number of attempts: 1 Airway Equipment and Method: Stylet Placement Confirmation: ETT inserted through vocal cords under direct vision, positive ETCO2 and breath sounds checked- equal and bilateral Secured at: 20 cm Tube secured with: Tape Dental Injury: Teeth and Oropharynx as per pre-operative assessment

## 2021-10-27 NOTE — Brief Op Note (Signed)
10/27/2021  3:30 AM  PATIENT:  [name]  71 y.o. female  PRE-OPERATIVE DIAGNOSIS:  Right Kidney stone, Sepsis  POST-OPERATIVE DIAGNOSIS:  Right Kidney stone, Sepsis  PROCEDURE:  Procedure(s): CYSTOSCOPY WITH RETROGRADE PYELOGRAM/URETERAL STENT PLACEMENT (Right)  SURGEON:  Surgeon(s) and Role:    Alexis Frock, MD - Primary  PHYSICIAN ASSISTANT:   ASSISTANTS: none   ANESTHESIA:   general  EBL:  minimal   BLOOD ADMINISTERED:none  DRAINS:  foley to gravity    LOCAL MEDICATIONS USED:  NONE  SPECIMEN:  No Specimen  DISPOSITION OF SPECIMEN:  N/A  COUNTS:  YES  TOURNIQUET:  * No tourniquets in log *  DICTATION: .Other Dictation: Dictation Number [id number]  PLAN OF CARE: Admit to inpatient   PATIENT DISPOSITION:  PACU - hemodynamically stable.   Delay start of Pharmacological VTE agent (>24hrs) due to surgical blood loss or risk of bleeding: not applicable

## 2021-10-27 NOTE — Assessment & Plan Note (Signed)
Dr. Tresa Moore consulted.  Coming to place emergent ureteral stent now. Pt NPO for procedure

## 2021-10-27 NOTE — Assessment & Plan Note (Signed)
Patient meets criteria for sepsis at time of admission. Specifically the patient has at least 2 out of 4 SIRS criteria, namely: Tachycardia, leukocytosis The currently suspected source of infection is pyohydronephrosis (infected kidney stone) Lactic acid: 2.8 Blood pressure: 99/61 IVF: 2L bolus then LR at 125 cc/hr Antibiotics: Rocephin Ordered urine and blood cultures (not clear if these were or wernt drawn prior to ABx though).

## 2021-10-27 NOTE — Assessment & Plan Note (Signed)
Hold home metformin Sensitive SSI Q4H

## 2021-10-27 NOTE — Assessment & Plan Note (Signed)
Cont home MS contin scheduled and PRN norco

## 2021-10-27 NOTE — Plan of Care (Signed)
  Problem: Fluid Volume: Goal: Hemodynamic stability will improve Outcome: Progressing   Problem: Clinical Measurements: Goal: Diagnostic test results will improve Outcome: Progressing Goal: Signs and symptoms of infection will decrease Outcome: Progressing   Problem: Respiratory: Goal: Ability to maintain adequate ventilation will improve Outcome: Progressing   Problem: Education: Goal: Ability to describe self-care measures that may prevent or decrease complications (Diabetes Survival Skills Education) will improve Outcome: Progressing Goal: Individualized Educational Video(s) Outcome: Progressing   Problem: Coping: Goal: Ability to adjust to condition or change in health will improve Outcome: Progressing   Problem: Fluid Volume: Goal: Ability to maintain a balanced intake and output will improve Outcome: Progressing   Problem: Health Behavior/Discharge Planning: Goal: Ability to identify and utilize available resources and services will improve Outcome: Progressing Goal: Ability to manage health-related needs will improve Outcome: Progressing   Problem: Metabolic: Goal: Ability to maintain appropriate glucose levels will improve Outcome: Progressing   Problem: Nutritional: Goal: Maintenance of adequate nutrition will improve Outcome: Progressing Goal: Progress toward achieving an optimal weight will improve Outcome: Progressing   Problem: Skin Integrity: Goal: Risk for impaired skin integrity will decrease Outcome: Progressing   Problem: Tissue Perfusion: Goal: Adequacy of tissue perfusion will improve Outcome: Progressing

## 2021-10-27 NOTE — Op Note (Signed)
NAME: [name], [name] MEDICAL RECORD NO: [id number] ACCOUNT NO: [id number] DATE OF BIRTH: 1950/12/23 FACILITY: [facility] LOCATION: [facility] PHYSICIAN: Alexis Frock, MD  Operative Report   DATE OF PROCEDURE: 10/27/2021  SURGEON:  Alexis Frock, MD  PREOPERATIVE DIAGNOSIS:  Right ureteral stone with sepsis.  PROCEDURE PERFORMED: 1.  Cystoscopy with right retrograde pyelogram and interpretation. 2.  Insertion of right ureteral stent.  ESTIMATED BLOOD LOSS:  Nil.  COMPLICATIONS:  None.  SPECIMEN:  None.  FINDINGS: 1.  Mild right hydronephrosis. 2.  Successful placement of right ureteral stent, proximal end in upper pole, distal end in urinary bladder.  INDICATIONS:  The patient is a 71 year old lady with significant comorbidity including significant diabetes and rheumatoid arthritis, on Plaquenil.  She was found on workup of colicky flank pain to have a right proximal ureteral stone.  She also was  found to have lactic acidosis, leukocytosis, tachycardia, bacteruria, quite concerning for impending urosepsis.  Options were discussed including recommended path of urgent decompression with stenting and admission for IV antibiotics and definitive  treatment of her stone after she clears infectious parameters and she wished to proceed with stenting tonight in the emergent fashion.  Informed consent was obtained and placed in medical record.  PROCEDURE IN DETAIL:  The patient being [name] verified.  Procedure being cystoscopy and right stent placement was confirmed.  Procedure timeout was performed.  Intravenous antibiotics were verified.  General endotracheal anesthesia induced.  The  patient was placed into a low lithotomy position.  Sterile field was created, prepped and draped the patient's vagina, introitus, and proximal thighs using iodine.  Cystourethroscopy was performed using 21-French rigid cystoscope with offset lens.   Inspection of urinary bladder revealed no diverticula,  calcifications, papillary lesions.  Urine was quite proteinaceous. The right ureteral orifice was cannulated with 6-French end-hole catheter, and right retrograde pyelogram was obtained.  Right retrograde pyelogram demonstrated single right ureter, single system right kidney.  There was minimal hydronephrosis noted.  The stone was somewhat hard to visualize radiographically as it overlaid some spine hardware.  A 0.038 ZIPwire was easily  advanced to the level of upper pole over which a new 6 x 24 double-J stent was placed using cystoscopic and fluoroscopic guidance.  Good proximal and distal plane were noted.  Efflux of urine was seen around and through the distal end of the stent.   Given her significant infectious parameters, a 16-French Foley catheter was placed free to straight drain, 10 mL water in the balloon.  Procedure was terminated.  The patient tolerated procedure well, no immediate perioperative complications.  The  patient was taken to postanesthesia care unit in stable condition.  Plan for medical admission.   NIK D: 10/27/2021 3:33:31 am T: 10/27/2021 4:11:00 am  JOB: [id number]/ [id number]

## 2021-10-27 NOTE — ED Notes (Signed)
ED Provider at bedside. 

## 2021-10-27 NOTE — Consult Note (Signed)
Reason for Consult: Right Ureteral Joaquim Lai, Urosepsis  Referring Physician: Dorise Bullion MD  [name] is an 71 y.o. female.   HPI:   1 - RIGHT Ureteral Stone - 46m Rt prox stone that is solitary by ER CT 09/2021 on eval malaiose and flank pain. Previous stones treates with liothotripsy per report.  2 - Urosepsis - malaise, lactric acidosis, leukocytosis, tachycardia, bacterureia c/w urosepssi from obstructing stone as per above. UCX pending, Empiric rocephin given. She is immune supressed with plaquenil for rheumatoid and diabetic.  PMH sig for obesity, AFib/Coumdin, DM2, RA on immune modulators.  Today "[name] is seen as emergent consult for above. Last meal >12 hours ago.   Past Medical History:  Diagnosis Date   Asthma, mild    Intermittent   Atrial fibrillation ([facility]    Feb 2023   Cancer ([facility]    Chronic back pain    Echocardiogram abnormal 09/2008   With mild aortic valve and mitral valve regurgitation   Heart murmur    History of left breast cancer 1999   Hyperlipidemia    Hypertension    Hypothyroidism    IBS (irritable bowel syndrome)    Lactose intolerance    Osteoporosis    Peptic ulcer disease    [place] II diabetes [place]    [facility]   Ulcer, duodenal, acute, with obstruction 1999    Past Surgical History:  Procedure Laterality Date   ABDOMINAL HYSTERECTOMY     BREAST IMPLANT REMOVAL     Breast implant infected removed implant 06/06 infected and removed 11/05   BREAST LUMPECTOMY  09/99   Left breast   BREAST RECONSTRUCTION     CARDIOVERSION N/A 08/11/2021   Procedure: CARDIOVERSION;  Surgeon: TRex Kras DO;  [facility];  Service: Cardiovascular;  Laterality: N/A;   CHOLECYSTECTOMY     CYSTOSCOPY W/ URETERAL STENT PLACEMENT Left 01/19/2018   Procedure: CYSTOSCOPY WITH RETROGRADE PYELOGRAM/URETERAL STENT PLACEMENT;  Surgeon: DFranchot Gallo MD;  Location: [facility];  Service: Urology;  Laterality: Left;   EXTRACORPOREAL  SHOCK WAVE LITHOTRIPSY Left 02/15/2018   Procedure: LEFT EXTRACORPOREAL SHOCK WAVE LITHOTRIPSY (ESWL);  Surgeon: MCleon Gustin MD;  Location: [facility];  Service: Urology;  Laterality: Left;   MASTECTOMY  03/05   Left   other     Left Arm FX   OTHER SURGICAL HISTORY     Hysterectomy- Ovaries intact    OTHER SURGICAL HISTORY     Fracture Left HIp   PARTIAL GASTRECTOMY  1999    Family History  Problem Relation Age of Onset   Heart failure Mother    Osteoporosis Mother    Heart attack Father    Hypertension Father    Diabetes Father    Diabetes Mellitus II Father    Diabetes Sister    Thyroid cancer Sister    Diabetes Brother    Colon cancer Neg Hx    Esophageal cancer Neg Hx    Stomach cancer Neg Hx     Social History:  reports that she quit smoking about 21 years ago. Her smoking use included cigarettes. She has never used smokeless tobacco. She reports that she does not drink alcohol and does not use drugs.  Allergies:  Allergies  Allergen Reactions   Fish Allergy Anaphylaxis and Hives   Amaryl [Glimepiride] Itching   Avalide [Irbesartan-Hydrochlorothiazide] Itching   Avandia [Rosiglitazone] Itching   Byetta 10 Mcg Pen [Exenatide] Itching   Codeine Itching    Other reaction(s): Unknown  Demerol [Meperidine Hcl] Itching   Glucovance [Glyburide-Metformin] Itching   Iodinated Contrast Media Itching    Other reaction(s): Unknown   Lisinopril Itching   Naprosyn [Naproxen] Hives   Penicillins Itching and Other (See Comments)    Has patient had a PCN reaction causing immediate rash, facial/tongue/throat swelling, SOB or lightheadedness with hypotension: no Has patient had a PCN reaction causing severe rash involving mucus membranes or skin necrosis: no Has patient had a PCN reaction that required hospitalization: yes Has patient had a PCN reaction occurring within the last 10 years: no If all of the above answers are "NO", then may proceed with Cephalosporin use.     Medications: I have reviewed the patient's current medications.  Results for orders placed or performed during the hospital encounter of 10/26/21 (from the past 48 hour(s))  Urinalysis, Routine w reflex microscopic     Status: Abnormal   Collection Time: 10/26/21  6:15 PM  Result Value Ref Range   Color, Urine YELLOW YELLOW   APPearance HAZY (A) CLEAR   Specific Gravity, Urine 1.017 1.005 - 1.030   pH 5.0 5.0 - 8.0   Glucose, UA 150 (A) NEGATIVE mg/dL   Hgb urine dipstick MODERATE (A) NEGATIVE   Bilirubin Urine NEGATIVE NEGATIVE   Ketones, ur 5 (A) NEGATIVE mg/dL   Protein, ur 30 (A) NEGATIVE mg/dL   Nitrite NEGATIVE NEGATIVE   Leukocytes,Ua SMALL (A) NEGATIVE   RBC / HPF 21-50 0 - 5 RBC/hpf   WBC, UA 21-50 0 - 5 WBC/hpf   Bacteria, UA MANY (A) NONE SEEN   Squamous Epithelial / LPF 0-5 0 - 5   Mucus PRESENT     Comment: Performed at [facility], [address] [place], [place] [address]  CBC     Status: Abnormal   Collection Time: 10/26/21  6:15 PM  Result Value Ref Range   WBC 18.5 (H) 4.0 - 10.5 K/uL   RBC 3.87 3.87 - 5.11 MIL/uL   Hemoglobin 11.5 (L) 12.0 - 15.0 g/dL   HCT 34.0 (L) 36.0 - 46.0 %   MCV 87.9 80.0 - 100.0 fL   MCH 29.7 26.0 - 34.0 pg   MCHC 33.8 30.0 - 36.0 g/dL   RDW 15.5 11.5 - 15.5 %   Platelets 463 (H) 150 - 400 K/uL   nRBC 0.0 0.0 - 0.2 %    Comment: Performed at [facility], [place] [address], [place], [place] [address]  Basic metabolic panel     Status: Abnormal   Collection Time: 10/26/21  6:15 PM  Result Value Ref Range   Sodium 133 (L) 135 - 145 mmol/L   Potassium 3.5 3.5 - 5.1 mmol/L   Chloride 90 (L) 98 - 111 mmol/L   CO2 25 22 - 32 mmol/L   Glucose, Bld 256 (H) 70 - 99 mg/dL    Comment: Glucose reference range applies only to samples taken after fasting for at least 8 hours.   BUN 19 8 - 23 mg/dL   Creatinine, Ser 0.61 0.44 - 1.00 mg/dL   Calcium 9.4 8.9 - 10.3 mg/dL   GFR, Estimated >60 >60 mL/min    Comment:  (NOTE) Calculated using the CKD-EPI Creatinine Equation (2021)    Anion gap 18 (H) 5 - 15    Comment: Performed at [facility] [address] [place],  [address]  Lactic acid, plasma     Status: Abnormal   Collection Time: 10/27/21 12:17 AM  Result Value Ref Range   Lactic Acid, Venous 2.8 (HH) 0.5 - 1.9 mmol/L    Comment: CRITICAL RESULT CALLED TO, READ BACK BY AND VERIFIED WITH:  A. Grandville Silos,  [place], 10/27/21, Laurian Brim Performed at [facility], [place] [address] [place], [place] [address]     CT Renal Stone Study  Result Date: 10/26/2021 CLINICAL DATA:  Right flank pain EXAM: CT ABDOMEN AND PELVIS WITHOUT CONTRAST TECHNIQUE: Multidetector CT imaging of the abdomen and pelvis was performed following the standard protocol without IV contrast. RADIATION DOSE REDUCTION: This exam was performed according to the departmental dose-optimization program which includes automated exposure control, adjustment of the mA and/or kV according to patient size and/or use of iterative reconstruction technique. COMPARISON:  08/18/2020 FINDINGS: Lower chest: No acute abnormality. Densely calcified mitral valve annulus. Aortic atherosclerosis. Hepatobiliary: No focal liver abnormality is seen. Status post cholecystectomy. No biliary dilatation. Pancreas: No focal abnormality or ductal dilatation. Spleen: No focal abnormality.  Normal size. Adrenals/Urinary Tract: Adrenal glands are unremarkable. Small exophytic cyst off the upper pole of the left kidney, stable. No follow-up imaging recommended. Mild right hydronephrosis due to 5 mm proximal right ureteral stone. No hydronephrosis or stones on the left. Urinary bladder unremarkable. Stomach/Bowel: Stomach, large and small bowel grossly unremarkable. Vascular/Lymphatic: Aortic atherosclerosis. No evidence of aneurysm or adenopathy. Reproductive: Prior hysterectomy.  No adnexal masses. Other: No free fluid or free air. Musculoskeletal: No acute  bony abnormality. Postoperative changes in the lower lumbar spine. IMPRESSION: 5 mm proximal right ureteral stone with mild right hydronephrosis. Aortic atherosclerosis. Electronically Signed   By: Rolm Baptise M.D.   On: 10/26/2021 22:04    Review of Systems  Constitutional:  Positive for chills and fatigue.  Gastrointestinal:  Positive for nausea.  Genitourinary:  Positive for flank pain.  All other systems reviewed and are negative.  Blood pressure (!) 107/54, pulse (!) 127, temperature 98.1 F (36.7 C), temperature source Oral, resp. rate 20, height '5\' 3"'$  (1.6 m), weight 50.8 kg, SpO2 95 %. Physical Exam Vitals reviewed.  HENT:     Nose: Nose normal.     Mouth/Throat:     Mouth: Mucous membranes are moist.  Eyes:     Pupils: Pupils are equal, round, and reactive to light.  Cardiovascular:     Rate and Rhythm: Normal rate.  Pulmonary:     Effort: Pulmonary effort is normal.  Abdominal:     Comments: Moderate truncal obesity.   Genitourinary:    Comments: Moderate Rt CVAT at present.  Neurological:     Mental Status: She is alert.  Psychiatric:        Mood and Affect: Mood normal.     Assessment/Plan:  Red emergent renal decompression with Rt stent, then medical admission for sepsis treatment, ten management of her stone in elective setting with ureteroscoy in several weeks after clears infecious parameters. Risks, benefits, alternatives, expecgted peri-op course discussed.  Appreciate hospitalist team comanagement given her relative immun supression, sepsis, and metabolic comorbidity.   Alexis Frock 10/27/2021, 1:51 AM

## 2021-10-27 NOTE — Assessment & Plan Note (Addendum)
Holding home cardizem in setting of sepsis. Use short acting cardizem gtt if needed. Continue coumadin per pharm consult later this evening assuming she has no bleeding complications from the emergent procedure being done this morning. Currently tachy to 120: unclear if s.tach vs A.Fib RVR.  Getting EKG. Regardless, going to allow permissive tachycardia for the moment in setting of sepsis with borderline BPs, likely wouldn't tolerate any attempt at rate control for the moment.

## 2021-10-27 NOTE — Progress Notes (Signed)
Inpatient Diabetes Program Recommendations  AACE/ADA: New Consensus Statement on Inpatient Glycemic Control   Target Ranges:  Prepandial:   less than 140 mg/dL      Peak postprandial:   less than 180 mg/dL (1-2 hours)      Critically ill patients:  140 - 180 mg/dL    Latest Reference Range & Units 10/27/21 03:48 10/27/21 07:42 10/27/21 11:53  Glucose-Capillary 70 - 99 mg/dL 247 (H) 276 (H) 271 (H)    Latest Reference Range & Units 10/26/21 18:15 10/27/21 06:24  Glucose 70 - 99 mg/dL 256 (H) 265 (H)   Review of Glycemic Control  Diabetes history: DM2 Outpatient Diabetes medications: Metformin XR 2000 mg QPM Current orders for Inpatient glycemic control: Novolog 0-9 units Q4H  Inpatient Diabetes Program Recommendations:    Insulin: Noted patient received Decadron 5 mg at 3:16 am today. If glucose remains consistently over 180 mg/dl, may want to consider ordering Semglee 5 units Q24H.  HbgA1C: A1C in process.  Thanks, Barnie Alderman, RN, MSN, [place] Diabetes Coordinator Inpatient Diabetes Program [phone] (Team Pager from 8am to [place])

## 2021-10-27 NOTE — Assessment & Plan Note (Signed)
On Plaquinel and abatacept.  Abatacept not due till next week. Holding plaquinel in setting of severe infection.

## 2021-10-27 NOTE — Assessment & Plan Note (Signed)
Pt borderline low BP in setting of sepsis from infected kidney stone. Holding home BP meds.

## 2021-10-27 NOTE — Progress Notes (Addendum)
[facility] for Coumadin Indication: atrial fibrillation  Allergies  Allergen Reactions   Fish Allergy Anaphylaxis and Hives   Amaryl [Glimepiride] Itching   Avalide [Irbesartan-Hydrochlorothiazide] Itching   Avandia [Rosiglitazone] Itching   Byetta 10 Mcg Pen [Exenatide] Itching   Codeine Itching    Other reaction(s): Unknown   Demerol [Meperidine Hcl] Itching   Glucovance [Glyburide-Metformin] Itching   Iodinated Contrast Media Itching    Other reaction(s): Unknown   Lisinopril Itching   Naprosyn [Naproxen] Hives   Penicillins Itching and Other (See Comments)    Has patient had a PCN reaction causing immediate rash, facial/tongue/throat swelling, SOB or lightheadedness with hypotension: no Has patient had a PCN reaction causing severe rash involving mucus membranes or skin necrosis: no Has patient had a PCN reaction that required hospitalization: yes Has patient had a PCN reaction occurring within the last 10 years: no If all of the above answers are "NO", then may proceed with Cephalosporin use.    Patient Measurements: Height: '5\' 3"'$  (160 cm) Weight: 51.3 kg (113 lb 1.5 oz) IBW/kg (Calculated) : 52.4  Vital Signs: Temp: 97.7 F (36.5 C) (06/28 1138) Temp Source: Oral (06/28 1138) BP: 113/66 (06/28 1138) Pulse Rate: 100 (06/28 1156)  Labs: Recent Labs    10/26/21 1815 10/27/21 0624  HGB 11.5* 9.3*  HCT 34.0* 27.4*  PLT 463* 331  LABPROT  --  32.1*  INR  --  3.2*  CREATININE 0.61 0.87     Estimated Creatinine Clearance: 47.3 mL/min (by C-G formula based on SCr of 0.87 mg/dL).   Medical History: Past Medical History:  Diagnosis Date   Asthma, mild    Intermittent   Atrial fibrillation [facility])    Feb 2023   Cancer [facility])    Chronic back pain    Echocardiogram abnormal 09/2008   With mild aortic valve and mitral valve regurgitation   Heart murmur    History of left breast cancer 1999   Hyperlipidemia    Hypertension     Hypothyroidism    IBS (irritable bowel syndrome)    Lactose intolerance    Osteoporosis    Peptic ulcer disease    Type II diabetes mellitus ([place])    [facility]   Ulcer, duodenal, acute, with obstruction 1999    Assessment: 71 yo F c/o sudden onset of right flank/hip pain >> found to have right ureteral stone and plan for renal decompression and stent, to continue Coumadin for Afib provided no bleeding post procedure.  INR 3.2 Hgb 9.3, Plt 331 Home warfarin regimen: 2.'5mg'$  po qPM Last dose: warfarin 2.'5mg'$  6/26 19:30  No s/sx of bleeding per RN   Goal of Therapy:  INR 2-3   Plan:  HOLD warfarin today Monitor INR, CBC, and s/sx of bleeding daily  Luisa Hart, PharmD, BCPS Clinical Pharmacist 10/27/2021 1:54 PM   Please refer to AMION for pharmacy phone number

## 2021-10-27 NOTE — Progress Notes (Signed)
ANTICOAGULATION CONSULT NOTE - Initial Consult  Pharmacy Consult for Coumadin Indication: atrial fibrillation  Allergies  Allergen Reactions   Fish Allergy Anaphylaxis and Hives   Amaryl [Glimepiride] Itching   Avalide [Irbesartan-Hydrochlorothiazide] Itching   Avandia [Rosiglitazone] Itching   Byetta 10 Mcg Pen [Exenatide] Itching   Codeine Itching    Other reaction(s): Unknown   Demerol [Meperidine Hcl] Itching   Glucovance [Glyburide-Metformin] Itching   Iodinated Contrast Media Itching    Other reaction(s): Unknown   Lisinopril Itching   Naprosyn [Naproxen] Hives   Penicillins Itching and Other (See Comments)    Has patient had a PCN reaction causing immediate rash, facial/tongue/throat swelling, SOB or lightheadedness with hypotension: no Has patient had a PCN reaction causing severe rash involving mucus membranes or skin necrosis: no Has patient had a PCN reaction that required hospitalization: yes Has patient had a PCN reaction occurring within the last 10 years: no If all of the above answers are "NO", then may proceed with Cephalosporin use.    Patient Measurements: Height: '5\' 3"'$  (160 cm) Weight: 50.8 kg (112 lb) IBW/kg (Calculated) : 52.4  Vital Signs: Temp: 98.1 F (36.7 C) (06/27 1812) Temp Source: Oral (06/27 1812) BP: 99/61 (06/28 0234) Pulse Rate: 117 (06/28 0234)  Labs: Recent Labs    10/26/21 1815  HGB 11.5*  HCT 34.0*  PLT 463*  CREATININE 0.61    Estimated Creatinine Clearance: 51 mL/min (by C-G formula based on SCr of 0.61 mg/dL).   Medical History: Past Medical History:  Diagnosis Date   Asthma, mild    Intermittent   Atrial fibrillation [facility])    Feb 2023   Cancer [facility])    Chronic back pain    Echocardiogram abnormal 09/2008   With mild aortic valve and mitral valve regurgitation   Heart murmur    History of left breast cancer 1999   Hyperlipidemia    Hypertension    Hypothyroidism    IBS (irritable bowel syndrome)    Lactose  intolerance    Osteoporosis    Peptic ulcer disease    Type II diabetes mellitus ([place])    [facility]   Ulcer, duodenal, acute, with obstruction 1999    Assessment: 71yo female c/o sudden onset of right flank/hip pain >> found to have right ureteral stone and plan for renal decompression and stent, to continue Coumadin for Afib provided no bleeding post procedure.  Goal of Therapy:  INR 2-3   Plan:  Monitor INR for Coumadin dose adjustments.  Wynona Neat, PharmD, BCPS  10/27/2021,2:40 AM

## 2021-10-27 NOTE — Transfer of Care (Signed)
Immediate Anesthesia Transfer of Care Note  Patient: [name]  Procedure(s) Performed: CYSTOSCOPY WITH RETROGRADE PYELOGRAM/URETERAL STENT PLACEMENT (Right: Ureter)  Patient Location: PACU  Anesthesia Type:General  Level of Consciousness: awake, alert  and oriented  Airway & Oxygen Therapy: Patient Spontanous Breathing and Patient connected to nasal cannula oxygen  Post-op Assessment: Report given to RN and Post -op Vital signs reviewed and stable  Post vital signs: Reviewed and stable  Last Vitals:  Vitals Value Taken Time  BP 97/70 10/27/21 0346  Temp    Pulse 112 10/27/21 0351  Resp 19 10/27/21 0351  SpO2 100 % 10/27/21 0351  Vitals shown include unvalidated device data.  Last Pain:  Vitals:   10/27/21 0028  TempSrc:   PainSc: 7          Complications: No notable events documented.

## 2021-10-27 NOTE — ED Notes (Signed)
This RN called report to [facility] in [place]

## 2021-10-27 NOTE — Progress Notes (Addendum)
Same day note  Patient seen and examined at bedside.  Patient was admitted to the hospital for flank pain.  At the time of my evaluation, patient complains of mild flank pain  Physical examination reveals average built female not in obvious distress.  On nasal cannula oxygen  Laboratory data and imaging was reviewed  Assessment and Plan.  Sepsis secondary to pyohydronephrosis.  Status post cystoscopy ureteroscopy with stent placement.  Continue broad-spectrum antibiotics with IV Rocephin..  Follow blood cultures.  Follow urology recommendations.  Significantly elevated procalcitonin.  Monitor in AM.  Hypokalemia.  We will replenish orally.  Check levels in AM.  History of rheumatoid arthritis on Plaquenil and abatacept.  Holding Plaquenil.  Not due for abatacept until next week.  Essential hypertension.   Blood pressure was low initially.  On Cardizem, hydrochlorothiazide, statin at home.  Due to mild tachycardia we will initiate Cardizem.  Hold HCTZ and losartan for now.  Diabetes mellitus type 2.  Hold metformin, continue sliding scale insulin  Chronic pain continue MS Contin from home and as needed Norco  Atrial fibrillation.,  Mildly tachycardic now.  We will restart her Cardizem p.o.  No Charge  Signed,  Delila Pereyra, MD [facility]

## 2021-10-27 NOTE — ED Notes (Signed)
Mesner MD made aware if critical lactic 2.8.

## 2021-10-28 ENCOUNTER — Encounter (HOSPITAL_COMMUNITY): Payer: Self-pay | Admitting: Urology

## 2021-10-28 DIAGNOSIS — E119 Type 2 diabetes mellitus without complications: Secondary | ICD-10-CM | POA: Diagnosis not present

## 2021-10-28 DIAGNOSIS — N136 Pyonephrosis: Secondary | ICD-10-CM | POA: Diagnosis not present

## 2021-10-28 DIAGNOSIS — G8929 Other chronic pain: Secondary | ICD-10-CM | POA: Diagnosis not present

## 2021-10-28 DIAGNOSIS — E876 Hypokalemia: Secondary | ICD-10-CM

## 2021-10-28 DIAGNOSIS — I4891 Unspecified atrial fibrillation: Secondary | ICD-10-CM | POA: Diagnosis not present

## 2021-10-28 LAB — HEMOGLOBIN A1C
Hgb A1c MFr Bld: 7.6 % — ABNORMAL HIGH (ref 4.8–5.6)
Mean Plasma Glucose: 171 mg/dL

## 2021-10-28 LAB — BASIC METABOLIC PANEL
Anion gap: 12 (ref 5–15)
BUN: 21 mg/dL (ref 8–23)
CO2: 31 mmol/L (ref 22–32)
Calcium: 8.2 mg/dL — ABNORMAL LOW (ref 8.9–10.3)
Chloride: 95 mmol/L — ABNORMAL LOW (ref 98–111)
Creatinine, Ser: 0.66 mg/dL (ref 0.44–1.00)
GFR, Estimated: >60 mL/min (ref >60)
Glucose, Bld: 141 mg/dL — ABNORMAL HIGH (ref 70–99)
Potassium: 3.8 mmol/L (ref 3.5–5.1)
Sodium: 138 mmol/L (ref 135–145)

## 2021-10-28 LAB — PROCALCITONIN: Procalcitonin: 11.45 ng/mL

## 2021-10-28 LAB — URINE CULTURE

## 2021-10-28 LAB — GLUCOSE, CAPILLARY
Glucose-Capillary: 133 mg/dL — ABNORMAL HIGH (ref 70–99)
Glucose-Capillary: 216 mg/dL — ABNORMAL HIGH (ref 70–99)
Glucose-Capillary: 195 mg/dL — ABNORMAL HIGH (ref 70–99)
Glucose-Capillary: 310 mg/dL — ABNORMAL HIGH (ref 70–99)
Glucose-Capillary: 189 mg/dL — ABNORMAL HIGH (ref 70–99)
Glucose-Capillary: 180 mg/dL — ABNORMAL HIGH (ref 70–99)

## 2021-10-28 LAB — CBC
HCT: 28.4 % — ABNORMAL LOW (ref 36.0–46.0)
Hemoglobin: 9.2 g/dL — ABNORMAL LOW (ref 12.0–15.0)
MCH: 28.8 pg (ref 26.0–34.0)
MCHC: 32.4 g/dL (ref 30.0–36.0)
MCV: 88.8 fL (ref 80.0–100.0)
Platelets: 334 10*3/uL (ref 150–400)
RBC: 3.2 MIL/uL — ABNORMAL LOW (ref 3.87–5.11)
RDW: 16.3 % — ABNORMAL HIGH (ref 11.5–15.5)
WBC: 40.6 10*3/uL — ABNORMAL HIGH (ref 4.0–10.5)
nRBC: 0 % (ref 0.0–0.2)

## 2021-10-28 LAB — MAGNESIUM: Magnesium: 1.3 mg/dL — ABNORMAL LOW (ref 1.7–2.4)

## 2021-10-28 LAB — PROTIME-INR
INR: 3.2 — ABNORMAL HIGH (ref 0.8–1.2)
Prothrombin Time: 32.4 seconds — ABNORMAL HIGH (ref 11.4–15.2)

## 2021-10-28 MED ORDER — POTASSIUM CHLORIDE CRYS ER 20 MEQ PO TBCR
40.0000 meq | EXTENDED_RELEASE_TABLET | Freq: Once | ORAL | Status: AC
  Administered 2021-10-28: 40 meq via ORAL
  Filled 2021-10-28: qty 2

## 2021-10-28 MED ORDER — MAGNESIUM OXIDE -MG SUPPLEMENT 400 (240 MG) MG PO TABS
400.0000 mg | ORAL_TABLET | Freq: Two times a day (BID) | ORAL | Status: DC
  Administered 2021-10-28 – 2021-10-30 (×5): 400 mg via ORAL
  Filled 2021-10-28 (×5): qty 1

## 2021-10-28 MED ORDER — CHLORHEXIDINE GLUCONATE CLOTH 2 % EX PADS
6.0000 | MEDICATED_PAD | Freq: Every day | CUTANEOUS | Status: DC
  Administered 2021-10-28 – 2021-10-30 (×3): 6 via TOPICAL

## 2021-10-28 MED ORDER — MAGNESIUM SULFATE 2 GM/50ML IV SOLN
2.0000 g | Freq: Once | INTRAVENOUS | Status: AC
  Administered 2021-10-28: 2 g via INTRAVENOUS
  Filled 2021-10-28: qty 50

## 2021-10-28 NOTE — Plan of Care (Signed)
  Problem: Fluid Volume: Goal: Hemodynamic stability will improve Outcome: Progressing   Problem: Clinical Measurements: Goal: Diagnostic test results will improve Outcome: Progressing Goal: Signs and symptoms of infection will decrease Outcome: Progressing   Problem: Respiratory: Goal: Ability to maintain adequate ventilation will improve Outcome: Progressing   Problem: Education: Goal: Ability to describe self-care measures that may prevent or decrease complications (Diabetes Survival Skills Education) will improve Outcome: Progressing Goal: Individualized Educational Video(s) Outcome: Progressing   Problem: Coping: Goal: Ability to adjust to condition or change in health will improve Outcome: Progressing   Problem: Fluid Volume: Goal: Ability to maintain a balanced intake and output will improve Outcome: Progressing   Problem: Health Behavior/Discharge Planning: Goal: Ability to identify and utilize available resources and services will improve Outcome: Progressing Goal: Ability to manage health-related needs will improve Outcome: Progressing   Problem: Metabolic: Goal: Ability to maintain appropriate glucose levels will improve Outcome: Progressing   Problem: Nutritional: Goal: Maintenance of adequate nutrition will improve Outcome: Progressing Goal: Progress toward achieving an optimal weight will improve Outcome: Progressing   Problem: Skin Integrity: Goal: Risk for impaired skin integrity will decrease Outcome: Progressing   Problem: Tissue Perfusion: Goal: Adequacy of tissue perfusion will improve Outcome: Progressing

## 2021-10-28 NOTE — TOC Initial Note (Signed)
Transition of Care [facility]) - Initial/Assessment Note    Patient Details  Name: [name] MRN: [id number] Date of Birth: 03-13-1951  Transition of Care [facility]) CM/SW Contact:    Verdell Carmine, RN Phone Number: 10/28/2021, 1:10 PM  Clinical Narrative:                  Presented with back pain history of nephrolithiasis, UTI, Patient was in Atrial Fibrillation, started on cardizem. Had a cystoscopy with stent yesterday. High WBC (40). Urology on consult, no needs identified currently, but TOC will follow for any needs, recommendations, or transitions of care.      Barriers to Discharge: Continued Medical Work up   Patient Goals and CMS Choice        Expected Discharge Plan and Services         Living arrangements for the past 2 months: Mobile Home                                      Prior Living Arrangements/Services Living arrangements for the past 2 months: Mobile Home   Patient language and need for interpreter reviewed:: Yes        Need for Family Participation in Patient Care: Yes (Comment) Care giver support system in place?: Yes (comment)   Criminal Activity/Legal Involvement Pertinent to Current Situation/Hospitalization: No - Comment as needed  Activities of Daily Living Home Assistive Devices/Equipment: None ADL Screening (condition at time of admission) Patient's cognitive ability adequate to safely complete daily activities?: Yes Is the patient deaf or have difficulty hearing?: No Does the patient have difficulty seeing, even when wearing glasses/contacts?: No Does the patient have difficulty concentrating, remembering, or making decisions?: No Patient able to express need for assistance with ADLs?: Yes Does the patient have difficulty dressing or bathing?: No Independently performs ADLs?: Yes (appropriate for developmental age) Does the patient have difficulty walking or climbing stairs?: No Weakness of Legs: Both Weakness of  Arms/Hands: None  Permission Sought/Granted                  Emotional Assessment       Orientation: : Oriented to Self, Oriented to Place, Oriented to  Time Alcohol / Substance Use: Not Applicable Psych Involvement: No (comment)  Admission diagnosis:  Kidney stone [N20.0] Acute cystitis without hematuria [N30.00] Pyohydronephrosis [N13.6] Patient Active Problem List   Diagnosis Date Noted   Pyohydronephrosis 10/27/2021   RA (rheumatoid arthritis) ([place]) 10/27/2021   Chronic pain 10/27/2021   DM2 (diabetes mellitus, type 2) ([place]) 10/27/2021   HTN (hypertension) 10/27/2021   Hypokalemia 10/27/2021   Long term (current) use of anticoagulants 10/12/2021   Atrial fibrillation ([place])    Pressure injury of skin 01/22/2018   Acute pyelonephritis    Kidney stone    Sepsis ([place]) 01/19/2018   PCP:  Shirline Frees, MD Pharmacy:   [id number] [place] [id number]: [phone]:: [phone]    Social Determinants of Health (SDOH) Interventions    Readmission Risk Interventions     No data to display

## 2021-10-28 NOTE — Hospital Course (Addendum)
[name] is a 71 y.o. female with past medical history of atrial fibrillation on Coumadin, rheumatoid arthritis on chronic narcotics, diabetes mellitus type 2, hypertension, chronic backache and history of infected kidney stone on the left presented to the hospital with sudden onset of right flank pain without any fever or dysuria.  In the ED, patient was noted to be septic with tachycardia, leukocytosis and abnormal urinalysis consistent with UTI.  CT scan of the abdomen pelvis showed right ureteral stone with hydronephrosis.  Urology was consulted and patient was admitted to the hospital for further evaluation and treatment.     Assessment and Plan.  Principal Problem:   Pyohydronephrosis Active Problems:   Sepsis ([place])   Kidney stone   RA (rheumatoid arthritis) ([facility])   Atrial fibrillation ([facility])   Chronic pain   DM2 (diabetes mellitus, type 2) ([facility])   HTN (hypertension)   Hypokalemia   Sepsis secondary to pyohydronephrosis, present on admission.  Status post cystoscopy ureteroscopy with stent placement on 10/27/2021.  Continue  IV Rocephin. Significantly elevated procalcitonin.  Leukocytosis has slightly gone down from 45 K >40 K.>30K.  We will continue to monitor.  Urine culture with multiple species.  Blood cultures negative in 1 day.  Continue monitor CBC in AM.  Seen by urology at this time and plan is outpatient ureteroscopy in 3 weeks at [facility] for definitive stone management.  Urology recommend at least 10-day course of Keflex/Vantin on discharge.   Hypokalemia. resolved. Slightly high potassium today.  Hypomagnesemia.  Improved. Latest magnesium of 1.8   History of rheumatoid arthritis on Plaquenil and abatacept.  Holding Plaquenil.  Not due for abatacept until next week.  On morphine and hydrocodone as outpatient for pain management.  We will continue while in the hospital.   Essential hypertension.   Blood pressure was low initially.  Improved at this time.  On Cardizem,  hydrochlorothiazide, statin at home.  Cardizem has been initiated.  Will continue to hold HCTZ and losartan for now.  Diabetes mellitus type 2 with hyperglycemia.  Hold metformin, continue sliding scale insulin, diabetic diet.  Point-of-care glucose at 161  Chronic pain continue MS Contin from home and as needed Norco. On PRN morphine IV but has not been using much.  Feels better today.  Paroxysmal atrial fibrillation.,  Continue Cardizem.  Has improved.

## 2021-10-28 NOTE — Progress Notes (Signed)
PROGRESS NOTE    [name]  ERX:[id number] DOB: 02-25-1951 DOA: 10/26/2021 PCP: Shirline Frees, MD    Brief Narrative:   [name] is a 71 y.o. female with past medical history of atrial fibrillation on Coumadin, rheumatoid arthritis on chronic narcotics, diabetes mellitus type 2, hypertension, chronic backache and history of infected kidney stone on the left presented to the hospital with sudden onset of right flank pain without any fever or dysuria.  In the ED patient was noted to be septic with tachycardia leukocytosis and abnormal urinalysis consistent with UTI.  CT scan of the abdomen pelvis showed right ureteral stone with hydronephrosis.  Urology was consulted and patient was admitted to hospital for further evaluation and treatment.      Assessment and Plan.  Principal Problem:   Pyohydronephrosis Active Problems:   Sepsis ([place])   Kidney stone   RA (rheumatoid arthritis) ([facility])   Atrial fibrillation ([facility])   Chronic pain   DM2 (diabetes mellitus, type 2) (HCC)   HTN (hypertension)   Hypokalemia   Sepsis secondary to pyohydronephrosis.  Status post cystoscopy ureteroscopy with stent placement on 10/27/2021.Marland Kitchen  Continue broad-spectrum antibiotics with IV Rocephin..  Blood cultures negative in 1 day. Follow urology recommendations on Foley catheter..  Significantly elevated procalcitonin.  Leukocytosis has slightly gone down from 45 K to 40 K.  We will continue to monitor.    Hypokalemia.Marland Kitchen  Has improved to 3.8.  We will continue to replenish.  Hypomagnesemia.  We will replenish through IV and orally.  Check magnesium level in a.m.   History of rheumatoid arthritis on Plaquenil and abatacept.  Holding Plaquenil.  Not due for abatacept until next week.  On morphine and hydrocodone as outpatient for pain management.   Essential hypertension.   Blood pressure was low initially.  Improved at this time.  On Cardizem, hydrochlorothiazide, statin at home.  Cardizem has been  initiated.  Continue to hold HCTZ and losartan for now.  Diabetes mellitus type 2.  Hold metformin, continue sliding scale insulin, diabetic diet.  Chronic pain continue MS Contin from home and as needed Norco.  Needed as needed morphine IV as well for severe pain.  Feels better today.  Paroxysmal atrial fibrillation.,  Has been restarted on Cardizem from home.  Heart rate is much better controlled at this time.     DVT prophylaxis:    Code Status:     Code Status: Full Code  Disposition: Home likely in 1 to 2 days Status is: Inpatient Remains inpatient appropriate because: IV antibiotic, sepsis, significant leukocytosis, urology follow-up   Family Communication:  Communicated with the patient at bedside.  Consultants:  Urology  Procedures:  Cystoscopy ureteroscopy with stent placement on 10/27/2021.  Antimicrobials:  Rocephin IV 10/26/21>  Anti-infectives (From admission, onward)    Start     Dose/Rate Route Frequency Ordered Stop   10/27/21 2200  cefTRIAXone (ROCEPHIN) 2 g in sodium chloride 0.9 % 100 mL IVPB        2 g 200 mL/hr over 30 Minutes Intravenous Every 24 hours 10/27/21 0229     10/26/21 2330  cefTRIAXone (ROCEPHIN) 2 g in sodium chloride 0.9 % 100 mL IVPB        2 g 200 mL/hr over 30 Minutes Intravenous  Once 10/26/21 2320 10/27/21 0110       Subjective: Today, patient was seen and examined at bedside.  Complains of feeling little better today has less pain today.  No fevers chills or  rigor.  Inquiring about Foley catheter.  Objective: Vitals:   10/27/21 1925 10/27/21 2327 10/28/21 0327 10/28/21 0727  BP: 108/69 102/61 110/68 116/85  Pulse: 97 90 94 100  Resp: '16 14 14 15  '$ Temp: 97.7 F (36.5 C) 97.8 F (36.6 C) 98.8 F (37.1 C) 97.9 F (36.6 C)  TempSrc: Oral Oral Oral Oral  SpO2: 99% 100% 98% 94%  Weight:      Height:        Intake/Output Summary (Last 24 hours) at 10/28/2021 1006 Last data filed at 10/28/2021 0349 Gross per 24 hour   Intake 240 ml  Output 750 ml  Net -510 ml   Filed Weights   10/26/21 1812 10/27/21 0510  Weight: 50.8 kg 51.3 kg    Physical Examination: Body mass index is 20.03 kg/m.  General:  Average built, not in obvious distress HENT:   No scleral pallor or icterus noted. Oral mucosa is moist.  Chest:  Clear breath sounds.  Diminished breath sounds bilaterally. No crackles or wheezes.  CVS: S1 &S2 heard. No murmur.  Irregular rhythm. Abdomen: Soft, nontender, nondistended.  Bowel sounds are heard.  Foley catheter in place with dark urine. Extremities: No cyanosis, clubbing or edema.  Peripheral pulses are palpable. Psych: Alert, awake and oriented, flat affect. CNS:  No cranial nerve deficits.  Power equal in all extremities.   Skin: Warm and dry.  No rashes noted.  Data Reviewed:   CBC: Recent Labs  Lab 10/26/21 1815 10/27/21 0624 10/28/21 0545  WBC 18.5* 45.1* 40.6*  HGB 11.5* 9.3* 9.2*  HCT 34.0* 27.4* 28.4*  MCV 87.9 88.4 88.8  PLT 463* 331 045    Basic Metabolic Panel: Recent Labs  Lab 10/26/21 1815 10/27/21 0624 10/28/21 0545  NA 133* 136 138  K 3.5 3.2* 3.8  CL 90* 92* 95*  CO2 '25 26 31  '$ GLUCOSE 256* 265* 141*  BUN '19 20 21  '$ CREATININE 0.61 0.87 0.66  CALCIUM 9.4 8.4* 8.2*  MG  --   --  1.3*    Liver Function Tests: Recent Labs  Lab 10/27/21 0624  AST 28  ALT 21  ALKPHOS 71  BILITOT 0.7  PROT 5.8*  ALBUMIN 2.7*     Radiology Studies: DG Cystogram  Result Date: 10/27/2021 CLINICAL DATA:  Tandem 6 mm and 3 mm proximal right ureteral stones. EXAM: CYSTOGRAM TECHNIQUE: After catheterization of the urinary bladder following sterile technique the bladder was filled with unknown mL Cysto-Hypaque 30% by retrograde injection. Serial spot images were obtained during bladder filling and post draining. FLUOROSCOPY: Radiation Exposure Index (as provided by the fluoroscopic device): 1.12 mGy Kerma Fluoro time: 0.13 seconds. COMPARISON:  CT without contrast  yesterday at 9:32 p.m. FINDINGS: A double-J right ureteral stent was inserted retrograde. The proximal loop is in the main upper pole infundibulum and the inferior loop in the bladder. The two proximal ureteral calcifications measuring 6 mm and 3 mm, on the third image appear to have been displaced back into the renal pelvis by the stent. No contrast extravasation is seen. Right hydronephrosis and caliceal clubbing continue to be seen. IMPRESSION: Right ureteral stent insertion as described above. The 2 proximal right ureteral calcifications noted on CT appear to have been displaced back into the right renal pelvis by the stent, but this is unconfirmed on the limited images and other etiology for filling defects in the renal pelvis is not excluded. Follow-up CT with IV contrast and delayed images is recommended when clinically feasible.  Electronically Signed   By: Telford Nab M.D.   On: 10/27/2021 04:41   DG CYSTO X-RAY IMAGES   NO REPORT  Result Date: 10/27/2021 There is no Radiologist interpretation  for this exam.  CT Renal Stone Study  Result Date: 10/26/2021 CLINICAL DATA:  Right flank pain EXAM: CT ABDOMEN AND PELVIS WITHOUT CONTRAST TECHNIQUE: Multidetector CT imaging of the abdomen and pelvis was performed following the standard protocol without IV contrast. RADIATION DOSE REDUCTION: This exam was performed according to the departmental dose-optimization program which includes automated exposure control, adjustment of the mA and/or kV according to patient size and/or use of iterative reconstruction technique. COMPARISON:  08/18/2020 FINDINGS: Lower chest: No acute abnormality. Densely calcified mitral valve annulus. Aortic atherosclerosis. Hepatobiliary: No focal liver abnormality is seen. Status post cholecystectomy. No biliary dilatation. Pancreas: No focal abnormality or ductal dilatation. Spleen: No focal abnormality.  Normal size. Adrenals/Urinary Tract: Adrenal glands are unremarkable. Small  exophytic cyst off the upper pole of the left kidney, stable. No follow-up imaging recommended. Mild right hydronephrosis due to 5 mm proximal right ureteral stone. No hydronephrosis or stones on the left. Urinary bladder unremarkable. Stomach/Bowel: Stomach, large and small bowel grossly unremarkable. Vascular/Lymphatic: Aortic atherosclerosis. No evidence of aneurysm or adenopathy. Reproductive: Prior hysterectomy.  No adnexal masses. Other: No free fluid or free air. Musculoskeletal: No acute bony abnormality. Postoperative changes in the lower lumbar spine. IMPRESSION: 5 mm proximal right ureteral stone with mild right hydronephrosis. Aortic atherosclerosis. Electronically Signed   By: Rolm Baptise M.D.   On: 10/26/2021 22:04      LOS: 1 day    Flora Lipps, MD [facility] Available via Epic secure chat 7am-7pm After these hours, please refer to coverage provider listed on [email or web address] 10/28/2021, 10:06 AM

## 2021-10-28 NOTE — Progress Notes (Signed)
1 Day Post-Op   Subjective/Chief Complaint:   1 - RIGHT Ureteral Stone - 51m Rt prox stone that is solitary by ER CT 09/2021 on eval malaiose and flank pain. Previous stones treates with liothotripsy per report.   2 - Urosepsis - malaise, lactric acidosis, leukocytosis, tachycardia, bacterureia c/w urosepssi from obstructing stone as per above. UCX 6/27 multiple species, BCX 6/27 NGTD. Leukocytosis peak 45k on 6/28. On empiric rocephin. She is immune supressed with plaquenil for rheumatoid and diabetic.     Today "[name] is improving. Afebrile x 24 hours, WBC starting to trend down as is hear rate.   Objective: Vital signs in last 24 hours: Temp:  [97.5 F (36.4 C)-98.8 F (37.1 C)] 98.1 F (36.7 C) (06/29 1132) Pulse Rate:  [90-101] 101 (06/29 1132) Resp:  [14-18] 15 (06/29 1132) BP: (102-116)/(61-85) 112/69 (06/29 1132) SpO2:  [94 %-100 %] 96 % (06/29 1132)    Intake/Output from previous day: 06/28 0701 - 06/29 0700 In: 240 [P.O.:240] Out: 750 [Urine:750] Intake/Output this shift: Total I/O In: 240 [P.O.:240] Out: 300 [Urine:300]  General appearance: alert, cooperative, and much more alert / in good spirits Eyes: negative Nose: Nares normal. Septum midline. Mucosa normal. No drainage or sinus tenderness. Throat: lips, mucosa, and tongue normal; teeth and gums normal Back: symmetric, no curvature. ROM normal. No CVA tenderness. Resp: non-labored on minimal [place] O2, Sats low 90s Pelvic: external genitalia normal and foley in place with non-foul urine. Removed and discarded.  Extremities: extremities normal, atraumatic, no cyanosis or edema  Lab Results:  Recent Labs    10/27/21 0624 10/28/21 0545  WBC 45.1* 40.6*  HGB 9.3* 9.2*  HCT 27.4* 28.4*  PLT 331 334   BMET Recent Labs    10/27/21 0624 10/28/21 0545  NA 136 138  K 3.2* 3.8  CL 92* 95*  CO2 26 31  GLUCOSE 265* 141*  BUN 20 21  CREATININE 0.87 0.66  CALCIUM 8.4* 8.2*   PT/INR Recent Labs     10/27/21 0624 10/28/21 0545  LABPROT 32.1* 32.4*  INR 3.2* 3.2*   ABG No results for input(s): "PHART", "HCO3" in the last 72 hours.  Invalid input(s): "PCO2", "PO2"  Studies/Results: DG Cystogram  Result Date: 10/27/2021 CLINICAL DATA:  Tandem 6 mm and 3 mm proximal right ureteral stones. EXAM: CYSTOGRAM TECHNIQUE: After catheterization of the urinary bladder following sterile technique the bladder was filled with unknown mL Cysto-Hypaque 30% by retrograde injection. Serial spot images were obtained during bladder filling and post draining. FLUOROSCOPY: Radiation Exposure Index (as provided by the fluoroscopic device): 1.12 mGy Kerma Fluoro time: 0.13 seconds. COMPARISON:  CT without contrast yesterday at 9:32 p.m. FINDINGS: A double-J right ureteral stent was inserted retrograde. The proximal loop is in the main upper pole infundibulum and the inferior loop in the bladder. The two proximal ureteral calcifications measuring 6 mm and 3 mm, on the third image appear to have been displaced back into the renal pelvis by the stent. No contrast extravasation is seen. Right hydronephrosis and caliceal clubbing continue to be seen. IMPRESSION: Right ureteral stent insertion as described above. The 2 proximal right ureteral calcifications noted on CT appear to have been displaced back into the right renal pelvis by the stent, but this is unconfirmed on the limited images and other etiology for filling defects in the renal pelvis is not excluded. Follow-up CT with IV contrast and delayed images is recommended when clinically feasible. Electronically Signed   By: KNinfa LindenD.  On: 10/27/2021 04:41   DG CYSTO X-RAY IMAGES   NO REPORT  Result Date: 10/27/2021 There is no Radiologist interpretation  for this exam.  CT Renal Stone Study  Result Date: 10/26/2021 CLINICAL DATA:  Right flank pain EXAM: CT ABDOMEN AND PELVIS WITHOUT CONTRAST TECHNIQUE: Multidetector CT imaging of the abdomen and pelvis  was performed following the standard protocol without IV contrast. RADIATION DOSE REDUCTION: This exam was performed according to the departmental dose-optimization program which includes automated exposure control, adjustment of the mA and/or kV according to patient size and/or use of iterative reconstruction technique. COMPARISON:  08/18/2020 FINDINGS: Lower chest: No acute abnormality. Densely calcified mitral valve annulus. Aortic atherosclerosis. Hepatobiliary: No focal liver abnormality is seen. Status post cholecystectomy. No biliary dilatation. Pancreas: No focal abnormality or ductal dilatation. Spleen: No focal abnormality.  Normal size. Adrenals/Urinary Tract: Adrenal glands are unremarkable. Small exophytic cyst off the upper pole of the left kidney, stable. No follow-up imaging recommended. Mild right hydronephrosis due to 5 mm proximal right ureteral stone. No hydronephrosis or stones on the left. Urinary bladder unremarkable. Stomach/Bowel: Stomach, large and small bowel grossly unremarkable. Vascular/Lymphatic: Aortic atherosclerosis. No evidence of aneurysm or adenopathy. Reproductive: Prior hysterectomy.  No adnexal masses. Other: No free fluid or free air. Musculoskeletal: No acute bony abnormality. Postoperative changes in the lower lumbar spine. IMPRESSION: 5 mm proximal right ureteral stone with mild right hydronephrosis. Aortic atherosclerosis. Electronically Signed   By: Rolm Baptise M.D.   On: 10/26/2021 22:04    Anti-infectives: Anti-infectives (From admission, onward)    Start     Dose/Rate Route Frequency Ordered Stop   10/27/21 2200  cefTRIAXone (ROCEPHIN) 2 g in sodium chloride 0.9 % 100 mL IVPB        2 g 200 mL/hr over 30 Minutes Intravenous Every 24 hours 10/27/21 0229     10/26/21 2330  cefTRIAXone (ROCEPHIN) 2 g in sodium chloride 0.9 % 100 mL IVPB        2 g 200 mL/hr over 30 Minutes Intravenous  Once 10/26/21 2320 10/27/21 0110       Assessment/Plan:  1 - RIGHT  Ureteral Stone - temporized with stent. We will arrange for outpatient ureteroscopy at [facility] in about 3 weeks for definitive stone management. No further stone-directed care this admission.    2 - Urosepsis - Improving on empiric rocephin. I feel pt OK for DC home as soon as tomorrow on PO cephalosporin (keflex, vantin) for at least 10 day total course.  We will arrange Urol follow-up.  Please call me directly with questions anytime. Will follow PRN at this point in house.   Alexis Frock 10/28/2021

## 2021-10-28 NOTE — Progress Notes (Signed)
[place] for Coumadin Indication: atrial fibrillation  Allergies  Allergen Reactions   Fish Allergy Anaphylaxis and Hives   Amaryl [Glimepiride] Itching   Avalide [Irbesartan-Hydrochlorothiazide] Itching   Avandia [Rosiglitazone] Itching   Byetta 10 Mcg Pen [Exenatide] Itching   Codeine Itching    Other reaction(s): Unknown   Demerol [Meperidine Hcl] Itching   Glucovance [Glyburide-Metformin] Itching   Iodinated Contrast Media Itching    Other reaction(s): Unknown   Lisinopril Itching   Naprosyn [Naproxen] Hives   Penicillins Itching and Other (See Comments)    Has patient had a PCN reaction causing immediate rash, facial/tongue/throat swelling, SOB or lightheadedness with hypotension: no Has patient had a PCN reaction causing severe rash involving mucus membranes or skin necrosis: no Has patient had a PCN reaction that required hospitalization: yes Has patient had a PCN reaction occurring within the last 10 years: no If all of the above answers are "NO", then may proceed with Cephalosporin use.    Patient Measurements: Height: '5\' 3"'$  (160 cm) Weight: 51.3 kg (113 lb 1.5 oz) IBW/kg (Calculated) : 52.4  Vital Signs: Temp: 97.9 F (36.6 C) (06/29 0727) Temp Source: Oral (06/29 0727) BP: 116/85 (06/29 0727) Pulse Rate: 100 (06/29 0727)  Labs: Recent Labs    10/26/21 1815 10/27/21 0624 10/28/21 0545  HGB 11.5* 9.3* 9.2*  HCT 34.0* 27.4* 28.4*  PLT 463* 331 334  LABPROT  --  32.1* 32.4*  INR  --  3.2* 3.2*  CREATININE 0.61 0.87 0.66     Estimated Creatinine Clearance: 51.5 mL/min (by C-G formula based on SCr of 0.66 mg/dL).   Medical History: Past Medical History:  Diagnosis Date   Asthma, mild    Intermittent   Atrial fibrillation [facility])    Feb 2023   Cancer [facility])    Chronic back pain    Echocardiogram abnormal 09/2008   With mild aortic valve and mitral valve regurgitation   Heart murmur    History of left breast  cancer 1999   Hyperlipidemia    Hypertension    Hypothyroidism    IBS (irritable bowel syndrome)    Lactose intolerance    Osteoporosis    Peptic ulcer disease    Type II diabetes mellitus ([place])    [facility] 08-2012   Ulcer, duodenal, acute, with obstruction 1999    Assessment: 71 yo F c/o sudden onset of right flank/hip pain >> found to have right ureteral stone and plan for renal decompression and stent, to continue Coumadin for Afib provided no bleeding post procedure.  INR 3.2 Home warfarin regimen: 2.'5mg'$  po qPM Last dose: warfarin 2.'5mg'$  6/26 19:30  No s/sx of bleeding per RN   Goal of Therapy:  INR 2-3   Plan:  HOLD warfarin today Monitor INR, CBC, and s/sx of bleeding daily  Thank you for allowing pharmacy to be a part of this patient's care.  Donnald Garre, PharmD Clinical Pharmacist  Please check AMION for all [place] numbers After 10:00 PM, call [place] [phone]

## 2021-10-29 DIAGNOSIS — I4891 Unspecified atrial fibrillation: Secondary | ICD-10-CM | POA: Diagnosis not present

## 2021-10-29 DIAGNOSIS — G8929 Other chronic pain: Secondary | ICD-10-CM | POA: Diagnosis not present

## 2021-10-29 DIAGNOSIS — E119 Type 2 diabetes mellitus without complications: Secondary | ICD-10-CM | POA: Diagnosis not present

## 2021-10-29 DIAGNOSIS — N136 Pyonephrosis: Secondary | ICD-10-CM | POA: Diagnosis not present

## 2021-10-29 LAB — BASIC METABOLIC PANEL
Anion gap: 12 (ref 5–15)
BUN: 22 mg/dL (ref 8–23)
CO2: 26 mmol/L (ref 22–32)
Calcium: 8.1 mg/dL — ABNORMAL LOW (ref 8.9–10.3)
Chloride: 90 mmol/L — ABNORMAL LOW (ref 98–111)
Creatinine, Ser: 0.76 mg/dL (ref 0.44–1.00)
GFR, Estimated: >60 mL/min (ref >60)
Glucose, Bld: 143 mg/dL — ABNORMAL HIGH (ref 70–99)
Potassium: 5.4 mmol/L — ABNORMAL HIGH (ref 3.5–5.1)
Sodium: 128 mmol/L — ABNORMAL LOW (ref 135–145)

## 2021-10-29 LAB — GLUCOSE, CAPILLARY
Glucose-Capillary: 153 mg/dL — ABNORMAL HIGH (ref 70–99)
Glucose-Capillary: 159 mg/dL — ABNORMAL HIGH (ref 70–99)
Glucose-Capillary: 161 mg/dL — ABNORMAL HIGH (ref 70–99)
Glucose-Capillary: 161 mg/dL — ABNORMAL HIGH (ref 70–99)
Glucose-Capillary: 180 mg/dL — ABNORMAL HIGH (ref 70–99)
Glucose-Capillary: 197 mg/dL — ABNORMAL HIGH (ref 70–99)

## 2021-10-29 LAB — PROTIME-INR
INR: 2.4 — ABNORMAL HIGH (ref 0.8–1.2)
Prothrombin Time: 25.6 seconds — ABNORMAL HIGH (ref 11.4–15.2)

## 2021-10-29 LAB — CBC
HCT: 28.5 % — ABNORMAL LOW (ref 36.0–46.0)
Hemoglobin: 9.1 g/dL — ABNORMAL LOW (ref 12.0–15.0)
MCH: 28.6 pg (ref 26.0–34.0)
MCHC: 31.9 g/dL (ref 30.0–36.0)
MCV: 89.6 fL (ref 80.0–100.0)
Platelets: 298 10*3/uL (ref 150–400)
RBC: 3.18 MIL/uL — ABNORMAL LOW (ref 3.87–5.11)
RDW: 16.2 % — ABNORMAL HIGH (ref 11.5–15.5)
WBC: 30.8 10*3/uL — ABNORMAL HIGH (ref 4.0–10.5)
nRBC: 0 % (ref 0.0–0.2)

## 2021-10-29 LAB — MAGNESIUM: Magnesium: 1.8 mg/dL (ref 1.7–2.4)

## 2021-10-29 MED ORDER — WARFARIN SODIUM 2.5 MG PO TABS
2.5000 mg | ORAL_TABLET | Freq: Once | ORAL | Status: AC
  Administered 2021-10-29: 2.5 mg via ORAL
  Filled 2021-10-29: qty 1

## 2021-10-29 MED ORDER — PROMETHAZINE HCL 25 MG PO TABS
25.0000 mg | ORAL_TABLET | Freq: Four times a day (QID) | ORAL | Status: DC | PRN
  Administered 2021-10-29: 25 mg via ORAL
  Filled 2021-10-29: qty 1

## 2021-10-29 NOTE — Plan of Care (Signed)
  Problem: Fluid Volume: Goal: Hemodynamic stability will improve Outcome: Progressing   Problem: Clinical Measurements: Goal: Diagnostic test results will improve Outcome: Progressing Goal: Signs and symptoms of infection will decrease Outcome: Progressing   Problem: Respiratory: Goal: Ability to maintain adequate ventilation will improve Outcome: Progressing   Problem: Education: Goal: Ability to describe self-care measures that may prevent or decrease complications (Diabetes Survival Skills Education) will improve Outcome: Progressing Goal: Individualized Educational Video(s) Outcome: Progressing   Problem: Coping: Goal: Ability to adjust to condition or change in health will improve Outcome: Progressing   Problem: Fluid Volume: Goal: Ability to maintain a balanced intake and output will improve Outcome: Progressing   Problem: Health Behavior/Discharge Planning: Goal: Ability to identify and utilize available resources and services will improve Outcome: Progressing Goal: Ability to manage health-related needs will improve Outcome: Progressing   Problem: Metabolic: Goal: Ability to maintain appropriate glucose levels will improve Outcome: Progressing   Problem: Nutritional: Goal: Maintenance of adequate nutrition will improve Outcome: Progressing Goal: Progress toward achieving an optimal weight will improve Outcome: Progressing   Problem: Skin Integrity: Goal: Risk for impaired skin integrity will decrease Outcome: Progressing   Problem: Tissue Perfusion: Goal: Adequacy of tissue perfusion will improve Outcome: Progressing

## 2021-10-29 NOTE — Progress Notes (Addendum)
PROGRESS NOTE    CACHET MCCUTCHEN  LAG:[id number] DOB: [birth year] DOA: 10/26/2021 PCP: Shirline Frees, MD    Brief Narrative:  [name] is a 71 y.o. female with past medical history of atrial fibrillation on Coumadin, rheumatoid arthritis on chronic narcotics, diabetes mellitus type 2, hypertension, chronic backache and history of infected kidney stone on the left presented to the hospital with sudden onset of right flank pain without any fever or dysuria.  In the ED, patient was noted to be septic with tachycardia, leukocytosis and abnormal urinalysis consistent with UTI.  CT scan of the abdomen pelvis showed right ureteral stone with hydronephrosis.  Urology was consulted and patient was admitted to the hospital for further evaluation and treatment.     Assessment and Plan.  Principal Problem:   Pyohydronephrosis Active Problems:   Sepsis ([place])   Kidney stone   RA (rheumatoid arthritis) (HCC)   Atrial fibrillation (HCC)   Chronic pain   DM2 (diabetes mellitus, type 2) (HCC)   HTN (hypertension)   Hypokalemia   Sepsis secondary to pyohydronephrosis, present on admission.  Status post cystoscopy ureteroscopy with stent placement on 10/27/2021.  Continue  IV Rocephin. Significantly elevated procalcitonin.  Leukocytosis has slightly gone down from 45 K >40 K.>30K.  We will continue to monitor.  Urine culture with multiple species.  Blood cultures negative in 1 day.  Continue monitor CBC in AM.  Seen by urology at this time and plan is outpatient ureteroscopy in 3 weeks at [facility] for definitive stone management.  Urology recommend at least 10-day course of Keflex/Vantin on discharge.   Hypokalemia. resolved. Slightly high potassium today.  Hypomagnesemia.  Improved. Latest magnesium of 1.8   History of rheumatoid arthritis on Plaquenil and abatacept.  Holding Plaquenil.  Not due for abatacept until next week.  On morphine and hydrocodone as outpatient for pain management.  We will  continue while in the hospital.   Essential hypertension.   Blood pressure was low initially.  Improved at this time.  On Cardizem, hydrochlorothiazide, statin at home.  Cardizem has been initiated.  Will continue to hold HCTZ and losartan for now.  Diabetes mellitus type 2 with hyperglycemia.  Hold metformin, continue sliding scale insulin, diabetic diet.  Point-of-care glucose at 161  Chronic pain continue MS Contin from home and as needed Norco. On PRN morphine IV but has not been using much.  Feels better today.  Paroxysmal atrial fibrillation.,  Continue Cardizem.  Has improved.     DVT prophylaxis:  warfarin (COUMADIN) tablet 2.5 mg   Code Status:     Code Status: Full Code  Disposition: Home likely on 10/30/2021.  Status is: Inpatient  Remains inpatient appropriate because: IV antibiotic, sepsis, significant leukocytosis, pending clinical improvement.   Family Communication:  Spoke with the patient's sister on the phone.  Unable to reach the patient's son on the phone.  Consultants:  Urology  Procedures:  Cystoscopy ureteroscopy with stent placement on 10/27/2021.  Antimicrobials:  Rocephin IV 10/26/21>  Anti-infectives (From admission, onward)    Start     Dose/Rate Route Frequency Ordered Stop   10/27/21 2200  cefTRIAXone (ROCEPHIN) 2 g in sodium chloride 0.9 % 100 mL IVPB        2 g 200 mL/hr over 30 Minutes Intravenous Every 24 hours 10/27/21 0229     10/26/21 2330  cefTRIAXone (ROCEPHIN) 2 g in sodium chloride 0.9 % 100 mL IVPB        2 g 200 mL/hr  over 30 Minutes Intravenous  Once 10/26/21 2320 10/27/21 0110      Subjective: Today, patient was seen and examined at bedside.  Complains of nausea and chest back pain.  Asking for Phenergan which she usually takes at home.  Has fever chills.  Foley catheter out.  Objective: Vitals:   10/28/21 2300 10/29/21 0300 10/29/21 0846 10/29/21 1201  BP: 118/68 137/77 132/72 130/82  Pulse: 86 80 98 (!) 108  Resp: '15 16  16 20  '$ Temp: 98 F (36.7 C) 98 F (36.7 C) 98.3 F (36.8 C) 98.5 F (36.9 C)  TempSrc: Oral Oral Oral Oral  SpO2: 90% 90% 91% 93%  Weight:      Height:        Intake/Output Summary (Last 24 hours) at 10/29/2021 1339 Last data filed at 10/29/2021 0815 Gross per 24 hour  Intake 1540 ml  Output 800 ml  Net 740 ml   Filed Weights   10/26/21 1812 10/27/21 0510  Weight: 50.8 kg 51.3 kg    Physical Examination: Body mass index is 20.03 kg/m.   General:  Average built, not in obvious distress HENT:   No scleral pallor or icterus noted. Oral mucosa is moist.  Chest:    Diminished breath sounds bilaterally. No crackles or wheezes.  CVS: S1 &S2 heard. No murmur.  Regular rate and rhythm. Abdomen: Soft, nontender, nondistended.  Bowel sounds are heard.   Extremities: No cyanosis, clubbing or edema.  Peripheral pulses are palpable. Psych: Alert, awake and oriented, flat affect. CNS:  No cranial nerve deficits.  Power equal in all extremities.   Skin: Warm and dry.  No rashes noted.  Data Reviewed:   CBC: Recent Labs  Lab 10/26/21 1815 10/27/21 0624 10/28/21 0545 10/29/21 0259  WBC 18.5* 45.1* 40.6* 30.8*  HGB 11.5* 9.3* 9.2* 9.1*  HCT 34.0* 27.4* 28.4* 28.5*  MCV 87.9 88.4 88.8 89.6  PLT 463* 331 334 924    Basic Metabolic Panel: Recent Labs  Lab 10/26/21 1815 10/27/21 0624 10/28/21 0545 10/29/21 0259  NA 133* 136 138 128*  K 3.5 3.2* 3.8 5.4*  CL 90* 92* 95* 90*  CO2 '25 26 31 26  '$ GLUCOSE 256* 265* 141* 143*  BUN '19 20 21 22  '$ CREATININE 0.61 0.87 0.66 0.76  CALCIUM 9.4 8.4* 8.2* 8.1*  MG  --   --  1.3* 1.8    Liver Function Tests: Recent Labs  Lab 10/27/21 0624  AST 28  ALT 21  ALKPHOS 71  BILITOT 0.7  PROT 5.8*  ALBUMIN 2.7*     Radiology Studies: No results found.    LOS: 2 days    Flora Lipps, MD [facility] Available via Epic secure chat 7am-7pm After these hours, please refer to coverage provider listed on  [email or web address] 10/29/2021, 1:39 PM

## 2021-10-29 NOTE — Progress Notes (Signed)
[place] for Coumadin Indication: atrial fibrillation  Allergies  Allergen Reactions   Fish Allergy Anaphylaxis and Hives   Amaryl [Glimepiride] Itching   Avalide [Irbesartan-Hydrochlorothiazide] Itching   Avandia [Rosiglitazone] Itching   Byetta 10 Mcg Pen [Exenatide] Itching   Codeine Itching    Other reaction(s): Unknown   Demerol [Meperidine Hcl] Itching   Glucovance [Glyburide-Metformin] Itching   Iodinated Contrast Media Itching    Other reaction(s): Unknown   Lisinopril Itching   Naprosyn [Naproxen] Hives   Penicillins Itching and Other (See Comments)    Has patient had a PCN reaction causing immediate rash, facial/tongue/throat swelling, SOB or lightheadedness with hypotension: no Has patient had a PCN reaction causing severe rash involving mucus membranes or skin necrosis: no Has patient had a PCN reaction that required hospitalization: yes Has patient had a PCN reaction occurring within the last 10 years: no If all of the above answers are "NO", then may proceed with Cephalosporin use.    Patient Measurements: Height: '5\' 3"'$  (160 cm) Weight: 51.3 kg (113 lb 1.5 oz) IBW/kg (Calculated) : 52.4  Vital Signs: Temp: 98 F (36.7 C) (06/30 0300) Temp Source: Oral (06/30 0300) BP: 137/77 (06/30 0300) Pulse Rate: 80 (06/30 0300)  Labs: Recent Labs    10/27/21 0624 10/28/21 0545 10/29/21 0259  HGB 9.3* 9.2* 9.1*  HCT 27.4* 28.4* 28.5*  PLT 331 334 298  LABPROT 32.1* 32.4* 25.6*  INR 3.2* 3.2* 2.4*  CREATININE 0.87 0.66 0.76     Estimated Creatinine Clearance: 51.5 mL/min (by C-G formula based on SCr of 0.76 mg/dL).   Medical History: Past Medical History:  Diagnosis Date   Asthma, mild    Intermittent   Atrial fibrillation [facility])    Feb 2023   Cancer [facility])    Chronic back pain    Echocardiogram abnormal 09/2008   With mild aortic valve and mitral valve regurgitation   Heart murmur    History of left breast  cancer 1999   Hyperlipidemia    Hypertension    Hypothyroidism    IBS (irritable bowel syndrome)    Lactose intolerance    Osteoporosis    Peptic ulcer disease    Type II diabetes mellitus ([place])    [facility] 08-2012   Ulcer, duodenal, acute, with obstruction 1999    Assessment: 71 yo F c/o sudden onset of right flank/hip pain >> found to have right ureteral stone and plan for renal decompression and stent, to continue Coumadin for Afib provided no bleeding post procedure.  INR 2.4 Home warfarin regimen: 2.'5mg'$  po qPM Last dose: warfarin 2.'5mg'$  6/26 19:30  No s/sx of bleeding per RN   Goal of Therapy:  INR 2-3   Plan:  Give warfarin 2.'5mg'$  x1 today Recent procedure so monitor closely for signs/symptoms of bleed Monitor INR, CBC, and s/sx of bleeding daily  Thank you for allowing pharmacy to be a part of this patient's care.  Donnald Garre, PharmD Clinical Pharmacist  Please check AMION for all [place] numbers After 10:00 PM, call [place] [phone]

## 2021-10-29 NOTE — Care Management Important Message (Signed)
Important Message  Patient Details  Name: [name] MRN: [id number] Date of Birth: 1950-11-03   Medicare Important Message Given:  Yes     Orbie Pyo 10/29/2021, 4:14 PM

## 2021-10-30 DIAGNOSIS — I4891 Unspecified atrial fibrillation: Secondary | ICD-10-CM | POA: Diagnosis not present

## 2021-10-30 DIAGNOSIS — E119 Type 2 diabetes mellitus without complications: Secondary | ICD-10-CM | POA: Diagnosis not present

## 2021-10-30 DIAGNOSIS — G8929 Other chronic pain: Secondary | ICD-10-CM | POA: Diagnosis not present

## 2021-10-30 DIAGNOSIS — N136 Pyonephrosis: Secondary | ICD-10-CM | POA: Diagnosis not present

## 2021-10-30 LAB — CBC
HCT: 33.8 % — ABNORMAL LOW (ref 36.0–46.0)
Hemoglobin: 11.1 g/dL — ABNORMAL LOW (ref 12.0–15.0)
MCH: 29.1 pg (ref 26.0–34.0)
MCHC: 32.8 g/dL (ref 30.0–36.0)
MCV: 88.7 fL (ref 80.0–100.0)
Platelets: 463 10*3/uL — ABNORMAL HIGH (ref 150–400)
RBC: 3.81 MIL/uL — ABNORMAL LOW (ref 3.87–5.11)
RDW: 16.1 % — ABNORMAL HIGH (ref 11.5–15.5)
WBC: 29.4 10*3/uL — ABNORMAL HIGH (ref 4.0–10.5)
nRBC: 0 % (ref 0.0–0.2)

## 2021-10-30 LAB — COMPREHENSIVE METABOLIC PANEL
ALT: 71 U/L — ABNORMAL HIGH (ref 0–44)
AST: 83 U/L — ABNORMAL HIGH (ref 15–41)
Albumin: 2.2 g/dL — ABNORMAL LOW (ref 3.5–5.0)
Alkaline Phosphatase: 120 U/L (ref 38–126)
Anion gap: 12 (ref 5–15)
BUN: 17 mg/dL (ref 8–23)
CO2: 29 mmol/L (ref 22–32)
Calcium: 8.4 mg/dL — ABNORMAL LOW (ref 8.9–10.3)
Chloride: 94 mmol/L — ABNORMAL LOW (ref 98–111)
Creatinine, Ser: 0.72 mg/dL (ref 0.44–1.00)
GFR, Estimated: >60 mL/min (ref >60)
Glucose, Bld: 147 mg/dL — ABNORMAL HIGH (ref 70–99)
Potassium: 4.1 mmol/L (ref 3.5–5.1)
Sodium: 135 mmol/L (ref 135–145)
Total Bilirubin: 0.8 mg/dL (ref 0.3–1.2)
Total Protein: 6 g/dL — ABNORMAL LOW (ref 6.5–8.1)

## 2021-10-30 LAB — GLUCOSE, CAPILLARY
Glucose-Capillary: 243 mg/dL — ABNORMAL HIGH (ref 70–99)
Glucose-Capillary: 239 mg/dL — ABNORMAL HIGH (ref 70–99)

## 2021-10-30 LAB — MAGNESIUM: Magnesium: 1.6 mg/dL — ABNORMAL LOW (ref 1.7–2.4)

## 2021-10-30 LAB — PROTIME-INR
INR: 1.7 — ABNORMAL HIGH (ref 0.8–1.2)
Prothrombin Time: 19.8 seconds — ABNORMAL HIGH (ref 11.4–15.2)

## 2021-10-30 MED ORDER — WARFARIN SODIUM 4 MG PO TABS
4.0000 mg | ORAL_TABLET | Freq: Once | ORAL | Status: DC
Start: 2021-10-30 — End: 2021-10-30
  Filled 2021-10-30: qty 1

## 2021-10-30 MED ORDER — MAGNESIUM OXIDE -MG SUPPLEMENT 400 (240 MG) MG PO TABS: 400.0000 mg | ORAL_TABLET | Freq: Two times a day (BID) | ORAL | 0 refills | Status: AC

## 2021-10-30 MED ORDER — CEFPODOXIME PROXETIL 200 MG PO TABS: 200.0000 mg | ORAL_TABLET | Freq: Two times a day (BID) | ORAL | 0 refills | Status: AC

## 2021-10-30 MED ORDER — FLAVOXATE HCL 100 MG PO TABS: 100.0000 mg | ORAL_TABLET | Freq: Three times a day (TID) | ORAL | 0 refills | Status: AC | PRN

## 2021-10-30 NOTE — Progress Notes (Signed)
[place] for Coumadin Indication: atrial fibrillation  Allergies  Allergen Reactions   Fish Allergy Anaphylaxis and Hives   Amaryl [Glimepiride] Itching   Avalide [Irbesartan-Hydrochlorothiazide] Itching   Avandia [Rosiglitazone] Itching   Byetta 10 Mcg Pen [Exenatide] Itching   Codeine Itching    Other reaction(s): Unknown   Demerol [Meperidine Hcl] Itching   Glucovance [Glyburide-Metformin] Itching   Iodinated Contrast Media Itching    Other reaction(s): Unknown   Lisinopril Itching   Naprosyn [Naproxen] Hives   Penicillins Itching and Other (See Comments)    Has patient had a PCN reaction causing immediate rash, facial/tongue/throat swelling, SOB or lightheadedness with hypotension: no Has patient had a PCN reaction causing severe rash involving mucus membranes or skin necrosis: no Has patient had a PCN reaction that required hospitalization: yes Has patient had a PCN reaction occurring within the last 10 years: no If all of the above answers are "NO", then may proceed with Cephalosporin use.    Patient Measurements: Height: '5\' 3"'$  (160 cm) Weight: 51.3 kg (113 lb 1.5 oz) IBW/kg (Calculated) : 52.4  Vital Signs: Temp: 98.6 F (37 C) (07/01 0721) Temp Source: Oral (07/01 0721) BP: 120/66 (07/01 0721) Pulse Rate: 84 (07/01 0721)  Labs: Recent Labs    10/28/21 0545 10/29/21 0259 10/30/21 0300  HGB 9.2* 9.1* 11.1*  HCT 28.4* 28.5* 33.8*  PLT 334 298 463*  LABPROT 32.4* 25.6* 19.8*  INR 3.2* 2.4* 1.7*  CREATININE 0.66 0.76 0.72     Estimated Creatinine Clearance: 51.5 mL/min (by C-G formula based on SCr of 0.72 mg/dL).   Assessment: 71 yo F c/o sudden onset of right flank/hip pain, found to have right ureteral stone s/p stenting.  Pharmacy consulted to continue Coumadin for Afib.  INR sub-therapeutic due to Coumadin being on hold 6/27-6/29.  No bleeding reported.  Home Coumadin regimen: 2.'5mg'$  po daily  Goal of Therapy:   INR 2-3   Plan:  Coumadin '4mg'$  PO today if still here Recent procedure so monitor closely for signs/symptoms of bleed Monitor INR, CBC, and s/sx of bleeding daily  Natania Finigan D. Mina Marble, PharmD, BCPS, [place] 10/30/2021, 9:50 AM

## 2021-10-30 NOTE — Discharge Summary (Signed)
Physician Discharge Summary  [name] VOJ:[id number] DOB: [birth year] DOA: 10/26/2021  PCP: Shirline Frees, MD  Admit date: 10/26/2021 Discharge date: 10/30/2021  Admitted From: Home  Discharge disposition: Home  Recommendations for Outpatient Follow-Up:   Follow up with your primary care provider in one week.  Check CBC, BMP, magnesium in the next visit Follow-up with urology as outpatient for definite stone management.  Discharge Diagnosis:   Principal Problem:   Pyohydronephrosis Active Problems:   Sepsis ([place])   Kidney stone   RA (rheumatoid arthritis) (HCC)   Atrial fibrillation (HCC)   Chronic pain   DM2 (diabetes mellitus, type 2) (HCC)   HTN (hypertension)   Hypokalemia   Discharge Condition: Improved.  Diet recommendation:   Carbohydrate-modified.    Wound care: None.  Code status: Full.  History of Present Illness:   [name] is a 71 y.o. female with past medical history of atrial fibrillation on Coumadin, rheumatoid arthritis on chronic narcotics, diabetes mellitus type 2, hypertension, chronic backache and history of infected kidney stone on the left presented to the hospital with sudden onset of right flank pain without any fever or dysuria.  In the ED, patient was noted to be septic with tachycardia, leukocytosis and abnormal urinalysis consistent with UTI.  CT scan of the abdomen pelvis showed right ureteral stone with hydronephrosis.  Urology was consulted and patient was admitted to the hospital for further evaluation and treatment.     Hospital Course:   Following conditions were addressed during hospitalization as listed below,  Sepsis secondary to pyohydronephrosis, present on admission.   Status post cystoscopy ureteroscopy with stent placement on 10/27/2021.  Received IV Rocephin during hospitalization.. Significantly elevated procalcitonin on presentation..  Leukocytosis has slightly gone down from 45 K >40 K.>30K>29K.  He is  afebrile and clinically has improved.    Urine culture with multiple species.  Blood cultures negative in 3 days.   Seen by urology and plan is outpatient ureteroscopy in 3 weeks at [facility] for definitive stone management.  Urology recommend at least 10-day course of Keflex/Vantin on discharge.   Hypokalemia. resolved.  Potassium of 4.1.   Hypomagnesemia.  Mildly low.  Magnesium level of 1.6.  Continue magnesium oxide on discharge.   History of rheumatoid arthritis on Plaquenil and abatacept as outpatient.  On morphine and hydrocodone as outpatient for pain management.     Essential hypertension.    On Cardizem, hydrochlorothiazide, statin at home.  Will resume on discharge.  Diabetes mellitus type 2 with hyperglycemia.  Improved.  Resume metformin at discharge  Chronic pain continue MS Contin from home and as needed Norco.   Paroxysmal atrial fibrillation.,  Continue Cardizem.  Has improved.  On Coumadin for anticoagulation.  Disposition.  At this time, patient is stable for disposition home with outpatient PCP and urology follow-up.  Medical Consultants:   Urology.  Procedures:    cystoscopy ureteroscopy with stent placement on 10/27/2021.  Subjective:   Today, patient was seen and examined at bedside.  Denies any significant pain, nausea, vomiting, fever or chills.  Feels okay about going home  Discharge Exam:   Vitals:   10/29/21 2334 10/30/21 0721  BP: 139/82 120/66  Pulse: 100 84  Resp:  16  Temp:  98.6 F (37 C)  SpO2: 95% 96%   Vitals:   10/29/21 1547 10/29/21 1944 10/29/21 2334 10/30/21 0721  BP: (!) 158/85 (!) 145/76 139/82 120/66  Pulse: (!) 108 (!) 113 100 84  Resp: 16  20  16  Temp: 97.9 F (36.6 C) 98.1 F (36.7 C) 97.9 F (36.6 C) 98.6 F (37 C)  TempSrc: Oral Oral  Oral  SpO2: 94% 90% 95% 96%  Weight:      Height:       General: Alert awake, not in obvious distress HENT: pupils equally reacting to light,  No scleral pallor or icterus noted. Oral  mucosa is moist.  Chest:  Clear breath sounds.  Diminished breath sounds bilaterally. No crackles or wheezes.  CVS: S1 &S2 heard. No murmur.  Regular rate and rhythm. Abdomen: Soft, nontender, nondistended.  Bowel sounds are heard.   Extremities: No cyanosis, clubbing or edema.  Peripheral pulses are palpable. Psych: Alert, awake and oriented, normal mood CNS:  No cranial nerve deficits.  Power equal in all extremities.   Skin: Warm and dry.  No rashes noted.  The results of significant diagnostics from this hospitalization (including imaging, microbiology, ancillary and laboratory) are listed below for reference.     Diagnostic Studies:   DG Cystogram  Result Date: 10/27/2021 CLINICAL DATA:  Tandem 6 mm and 3 mm proximal right ureteral stones. EXAM: CYSTOGRAM TECHNIQUE: After catheterization of the urinary bladder following sterile technique the bladder was filled with unknown mL Cysto-Hypaque 30% by retrograde injection. Serial spot images were obtained during bladder filling and post draining. FLUOROSCOPY: Radiation Exposure Index (as provided by the fluoroscopic device): 1.12 mGy Kerma Fluoro time: 0.13 seconds. COMPARISON:  CT without contrast yesterday at 9:32 p.m. FINDINGS: A double-J right ureteral stent was inserted retrograde. The proximal loop is in the main upper pole infundibulum and the inferior loop in the bladder. The two proximal ureteral calcifications measuring 6 mm and 3 mm, on the third image appear to have been displaced back into the renal pelvis by the stent. No contrast extravasation is seen. Right hydronephrosis and caliceal clubbing continue to be seen. IMPRESSION: Right ureteral stent insertion as described above. The 2 proximal right ureteral calcifications noted on CT appear to have been displaced back into the right renal pelvis by the stent, but this is unconfirmed on the limited images and other etiology for filling defects in the renal pelvis is not excluded.  Follow-up CT with IV contrast and delayed images is recommended when clinically feasible. Electronically Signed   By: Telford Nab M.D.   On: 10/27/2021 04:41   DG CYSTO X-RAY IMAGES   NO REPORT  Result Date: 10/27/2021 There is no Radiologist interpretation  for this exam.  CT Renal Stone Study  Result Date: 10/26/2021 CLINICAL DATA:  Right flank pain EXAM: CT ABDOMEN AND PELVIS WITHOUT CONTRAST TECHNIQUE: Multidetector CT imaging of the abdomen and pelvis was performed following the standard protocol without IV contrast. RADIATION DOSE REDUCTION: This exam was performed according to the departmental dose-optimization program which includes automated exposure control, adjustment of the mA and/or kV according to patient size and/or use of iterative reconstruction technique. COMPARISON:  08/18/2020 FINDINGS: Lower chest: No acute abnormality. Densely calcified mitral valve annulus. Aortic atherosclerosis. Hepatobiliary: No focal liver abnormality is seen. Status post cholecystectomy. No biliary dilatation. Pancreas: No focal abnormality or ductal dilatation. Spleen: No focal abnormality.  Normal size. Adrenals/Urinary Tract: Adrenal glands are unremarkable. Small exophytic cyst off the upper pole of the left kidney, stable. No follow-up imaging recommended. Mild right hydronephrosis due to 5 mm proximal right ureteral stone. No hydronephrosis or stones on the left. Urinary bladder unremarkable. Stomach/Bowel: Stomach, large and small bowel grossly unremarkable. Vascular/Lymphatic: Aortic atherosclerosis.  No evidence of aneurysm or adenopathy. Reproductive: Prior hysterectomy.  No adnexal masses. Other: No free fluid or free air. Musculoskeletal: No acute bony abnormality. Postoperative changes in the lower lumbar spine. IMPRESSION: 5 mm proximal right ureteral stone with mild right hydronephrosis. Aortic atherosclerosis. Electronically Signed   By: Rolm Baptise M.D.   On: 10/26/2021 22:04     Labs:    Basic Metabolic Panel: Recent Labs  Lab 10/26/21 1815 10/27/21 0624 10/28/21 0545 10/29/21 0259 10/30/21 0300  NA 133* 136 138 128* 135  K 3.5 3.2* 3.8 5.4* 4.1  CL 90* 92* 95* 90* 94*  CO2 '25 26 31 26 29  '$ GLUCOSE 256* 265* 141* 143* 147*  BUN '19 20 21 22 17  '$ CREATININE 0.61 0.87 0.66 0.76 0.72  CALCIUM 9.4 8.4* 8.2* 8.1* 8.4*  MG  --   --  1.3* 1.8 1.6*   GFR Estimated Creatinine Clearance: 51.5 mL/min (by C-G formula based on SCr of 0.72 mg/dL). Liver Function Tests: Recent Labs  Lab 10/27/21 0624 10/30/21 0300  AST 28 83*  ALT 21 71*  ALKPHOS 71 120  BILITOT 0.7 0.8  PROT 5.8* 6.0*  ALBUMIN 2.7* 2.2*   No results for input(s): "LIPASE", "AMYLASE" in the last 168 hours. No results for input(s): "AMMONIA" in the last 168 hours. Coagulation profile Recent Labs  Lab 10/27/21 0624 10/28/21 0545 10/29/21 0259 10/30/21 0300  INR 3.2* 3.2* 2.4* 1.7*    CBC: Recent Labs  Lab 10/26/21 1815 10/27/21 0624 10/28/21 0545 10/29/21 0259 10/30/21 0300  WBC 18.5* 45.1* 40.6* 30.8* 29.4*  HGB 11.5* 9.3* 9.2* 9.1* 11.1*  HCT 34.0* 27.4* 28.4* 28.5* 33.8*  MCV 87.9 88.4 88.8 89.6 88.7  PLT 463* 331 334 298 463*   Cardiac Enzymes: No results for input(s): "CKTOTAL", "CKMB", "CKMBINDEX", "TROPONINI" in the last 168 hours. BNP: Invalid input(s): "POCBNP" CBG: Recent Labs  Lab 10/29/21 1200 10/29/21 1546 10/29/21 1946 10/29/21 2335 10/30/21 0719  GLUCAP 153* 161* 180* 197* 243*   D-Dimer No results for input(s): "DDIMER" in the last 72 hours. Hgb A1c No results for input(s): "HGBA1C" in the last 72 hours. Lipid Profile No results for input(s): "CHOL", "HDL", "LDLCALC", "TRIG", "CHOLHDL", "LDLDIRECT" in the last 72 hours. Thyroid function studies No results for input(s): "TSH", "T4TOTAL", "T3FREE", "THYROIDAB" in the last 72 hours.  Invalid input(s): "FREET3" Anemia work up No results for input(s): "VITAMINB12", "FOLATE", "FERRITIN", "TIBC", "IRON",  "RETICCTPCT" in the last 72 hours. Microbiology Recent Results (from the past 240 hour(s))  Urine Culture     Status: Abnormal   Collection Time: 10/26/21 11:20 PM   Specimen: Urine, Clean Catch  Result Value Ref Range Status   Specimen Description URINE, CLEAN CATCH  Final   Special Requests   Final    NONE Performed at [facility], [place] [address], [place], [place] [address]    Culture MULTIPLE SPECIES PRESENT, SUGGEST RECOLLECTION (A)  Final   Report Status 10/28/2021 FINAL  Final  Culture, blood (Routine X 2) w Reflex to ID Panel     Status: None (Preliminary result)   Collection Time: 10/27/21  6:24 AM   Specimen: BLOOD RIGHT HAND  Result Value Ref Range Status   Specimen Description BLOOD RIGHT HAND  Final   Special Requests   Final    BOTTLES DRAWN AEROBIC AND ANAEROBIC Blood Culture adequate volume   Culture   Final    NO GROWTH 2 DAYS Performed at [facility], [place]  [address] [place], [place] [address]    Report Status PENDING  Incomplete  Culture, blood (Routine X 2) w Reflex to ID Panel     Status: None (Preliminary result)   Collection Time: 10/27/21  6:33 AM   Specimen: BLOOD RIGHT FOREARM  Result Value Ref Range Status   Specimen Description BLOOD RIGHT FOREARM  Final   Special Requests   Final    BOTTLES DRAWN AEROBIC AND ANAEROBIC Blood Culture adequate volume   Culture   Final    NO GROWTH 2 DAYS Performed at [facility], [place] [address] [place], [place] [address]    Report Status PENDING  Incomplete     Discharge Instructions:   Discharge Instructions     Call MD for:  persistant nausea and vomiting   Complete by: As directed    Call MD for:  severe uncontrolled pain   Complete by: As directed    Call MD for:  temperature >100.4   Complete by: As directed    Diet Carb Modified   Complete by: As directed    Discharge instructions   Complete by: As directed    Follow-up with your primary care provider in 1 week.  Check blood  work at that time.  Continue antibiotic as prescribed.  Seek medical attention for worsening symptoms.  Follow-up with urology Dr. Tresa Moore as scheduled by the clinic   Increase activity slowly   Complete by: As directed       Allergies as of 10/30/2021       Reactions   Fish Allergy Anaphylaxis, Hives   Amaryl [glimepiride] Itching   Avalide [irbesartan-hydrochlorothiazide] Itching   Avandia [rosiglitazone] Itching   Byetta 10 Mcg Pen [exenatide] Itching   Codeine Itching   Other reaction(s): Unknown   Demerol [meperidine Hcl] Itching   Glucovance [glyburide-metformin] Itching   Iodinated Contrast Media Itching   Other reaction(s): Unknown   Lisinopril Itching   Naprosyn [naproxen] Hives   Penicillins Itching, Other (See Comments)   Has patient had a PCN reaction causing immediate rash, facial/tongue/throat swelling, SOB or lightheadedness with hypotension: no Has patient had a PCN reaction causing severe rash involving mucus membranes or skin necrosis: no Has patient had a PCN reaction that required hospitalization: yes Has patient had a PCN reaction occurring within the last 10 years: no If all of the above answers are "NO", then may proceed with Cephalosporin use.        Medication List     TAKE these medications    buPROPion 150 MG 24 hr tablet Commonly known as: WELLBUTRIN XL Take 150 mg by mouth every evening.   cefpodoxime 200 MG tablet Commonly known as: VANTIN Take 1 tablet (200 mg total) by mouth 2 (two) times daily for 5 days.   denosumab 60 MG/ML Sosy injection Commonly known as: PROLIA Inject 60 mg into the skin every 6 (six) months.   diltiazem 360 MG 24 hr capsule Commonly known as: TIAZAC Take 1 capsule (360 mg total) by mouth daily. What changed: when to take this   escitalopram 10 MG tablet Commonly known as: LEXAPRO Take 10 mg by mouth every evening.   esomeprazole 20 MG capsule Commonly known as: NEXIUM Take 40 mg by mouth every evening.    flavoxATE 100 MG tablet Commonly known as: URISPAS Take 1 tablet (100 mg total) by mouth 3 (three) times daily as needed for up to 5 days for bladder spasms.   folic acid 1 MG tablet Commonly  known as: FOLVITE Take 2 mg by mouth every evening.   hydrochlorothiazide 25 MG tablet Commonly known as: HYDRODIURIL TAKE 1 TABLET BY MOUTH EVERY DAY IN THE MORNING What changed: See the new instructions.   HYDROcodone-acetaminophen 10-325 MG tablet Commonly known as: NORCO Take 1 tablet by mouth every 6 (six) hours as needed (For pain.).   hydrocortisone 25 MG suppository Commonly known as: ANUSOL-HC Place 25 mg rectally 2 (two) times daily as needed for hemorrhoids.   hydroxychloroquine 200 MG tablet Commonly known as: PLAQUENIL Take 200 mg by mouth every evening.   levothyroxine 175 MCG tablet Commonly known as: SYNTHROID Take 175 mcg by mouth every evening.   losartan 50 MG tablet Commonly known as: COZAAR TAKE 1 TABLET BY MOUTH EVERY DAY What changed: when to take this   magnesium oxide 400 (240 Mg) MG tablet Commonly known as: MAG-OX Take 1 tablet (400 mg total) by mouth 2 (two) times daily for 10 days.   metFORMIN 500 MG 24 hr tablet Commonly known as: GLUCOPHAGE-XR Take 2,000 mg by mouth every evening.   morphine 30 MG 12 hr tablet Commonly known as: MS CONTIN Take 30 mg by mouth every 12 (twelve) hours.   multivitamin with minerals Tabs tablet Take 1 tablet by mouth every evening.   Orencia 250 MG injection Generic drug: abatacept Inject 250 mg into the vein every 30 (thirty) days.   pravastatin 40 MG tablet Commonly known as: PRAVACHOL Take 40 mg by mouth every evening.   warfarin 5 MG tablet Commonly known as: COUMADIN Take as directed. If you are unsure how to take this medication, talk to your nurse or doctor. Original instructions: Take 1 tablet (5 mg total) by mouth daily. What changed:  how much to take when to take this        Follow-up  Information     Alexis Frock, MD Follow up.   Specialty: Urology Why: Office will call to arrange outpatient stone surgery in few weeks. Contact information: [place] [address] [phone]         Shirline Frees, MD Follow up in 1 week(s).   Specialty: Family Medicine Contact information: [place] [address] [phone]                 Time coordinating discharge: 39 minutes  Signed:  Nataniel Gasper  [facility] 10/30/2021, 8:44 AM

## 2021-11-01 ENCOUNTER — Other Ambulatory Visit: Payer: Self-pay | Admitting: Urology

## 2021-11-01 LAB — CULTURE, BLOOD (ROUTINE X 2)
Culture: NO GROWTH
Culture: NO GROWTH
Special Requests: ADEQUATE
Special Requests: ADEQUATE

## 2021-11-04 ENCOUNTER — Other Ambulatory Visit: Payer: Self-pay

## 2021-11-04 DIAGNOSIS — I480 Paroxysmal atrial fibrillation: Secondary | ICD-10-CM

## 2021-11-04 MED ORDER — DILTIAZEM HCL ER BEADS 300 MG PO CP24: 300.0000 mg | ORAL_CAPSULE | Freq: Every day | ORAL | 1 refills | Status: DC

## 2021-11-08 DIAGNOSIS — E1169 Type 2 diabetes mellitus with other specified complication: Secondary | ICD-10-CM | POA: Diagnosis not present

## 2021-11-08 DIAGNOSIS — N200 Calculus of kidney: Secondary | ICD-10-CM | POA: Diagnosis not present

## 2021-11-08 DIAGNOSIS — N12 Tubulo-interstitial nephritis, not specified as acute or chronic: Secondary | ICD-10-CM | POA: Diagnosis not present

## 2021-11-08 NOTE — Progress Notes (Signed)
11/08/2021 2:13 PM Called Dr. Zettie Pho office and spoke with his nurse regarding need for patient to have cardiac clearance and instructions for coumadin pre surgery. RN at [facility] to contact Dr. Einar Gip who is patient's primary cardiologist for needed information.  Aubrey Blackard, Arville Lime

## 2021-11-09 NOTE — Progress Notes (Signed)
Left voice mail message for selita at dr Tresa Moore office, need clearance for coumadin for 11-19-2021 surgery

## 2021-11-10 ENCOUNTER — Encounter (HOSPITAL_BASED_OUTPATIENT_CLINIC_OR_DEPARTMENT_OTHER): Payer: Self-pay | Admitting: Urology

## 2021-11-10 ENCOUNTER — Other Ambulatory Visit: Payer: Self-pay

## 2021-11-10 NOTE — Progress Notes (Addendum)
Spoke w/ via phone for pre-op interview---pt Lab needs dos---- I stat, (gent),  inr              Lab results------ COVID test -----patient states asymptomatic no test needed Arrive at -------930 am 11-19-2021 NPO after MN NO Solid Food.  Clear liquids from MN until---830 am Med rec completed Medications to take morning of surgery -----ms contin Diabetic medication -----n/a Patient instructed no nail polish to be worn day of surgery Patient instructed to bring photo id and insurance card day of surgery Patient aware to have Driver (ride ) / caregiver  todd howell son   for 24 hours after surgery  Patient Special Instructions -----pt to stay on coumadin per dr  Tresa Moore instructions (Pt aware) Addendum: faxed note received  and placed on pt chart pt can stay on coumadin per dr Caro Hight cardiology Pre-Op special Istructions -----none Patient verbalized understanding of instructions that were given at this phone interview. Patient denies shortness of breath, chest pain, fever, cough at this phone interview.   Cardiac clearance note  for 11-19-2021 dr Rex Kras  dated 11-16-2021 received via fax and placed on pt chart. Marthann Schiller 10-27-2021 chart/epic Lov dr Terri Skains cardiology  09-30-2021 epic Stress test 08-31-2021 epic. Echo 08-25-2021 epic  Reviewed pt history and echo 08-25-2021 results with dr singer mda, pt ok for [facility] surgery  11-19-2021 per dr singer mda.

## 2021-11-18 ENCOUNTER — Other Ambulatory Visit: Payer: Self-pay | Admitting: Cardiology

## 2021-11-18 DIAGNOSIS — I10 Essential (primary) hypertension: Secondary | ICD-10-CM

## 2021-11-19 ENCOUNTER — Ambulatory Visit (HOSPITAL_BASED_OUTPATIENT_CLINIC_OR_DEPARTMENT_OTHER): Payer: Medicare Other | Admitting: Anesthesiology

## 2021-11-19 ENCOUNTER — Ambulatory Visit (HOSPITAL_COMMUNITY): Payer: Medicare Other | Admitting: Anesthesiology

## 2021-11-19 ENCOUNTER — Encounter (HOSPITAL_COMMUNITY)
Admission: RE | Disposition: A | Discharge status: Home / Self Care | Payer: Self-pay | Source: Home / Self Care | Attending: Urology

## 2021-11-19 ENCOUNTER — Ambulatory Visit (HOSPITAL_COMMUNITY): Payer: Medicare Other

## 2021-11-19 ENCOUNTER — Ambulatory Visit (HOSPITAL_COMMUNITY)
Admission: RE | Admit: 2021-11-19 | Discharge: 2021-11-19 | Disposition: A | Discharge status: Home / Self Care | Payer: Medicare Other | Attending: Urology | Admitting: Urology

## 2021-11-19 ENCOUNTER — Encounter (HOSPITAL_COMMUNITY): Payer: Self-pay | Admitting: Urology

## 2021-11-19 DIAGNOSIS — Z87440 Personal history of urinary (tract) infections: Secondary | ICD-10-CM

## 2021-11-19 DIAGNOSIS — Z7969 Long term (current) use of other immunomodulators and immunosuppressants: Secondary | ICD-10-CM | POA: Insufficient documentation

## 2021-11-19 DIAGNOSIS — M069 Rheumatoid arthritis, unspecified: Secondary | ICD-10-CM | POA: Insufficient documentation

## 2021-11-19 DIAGNOSIS — Z7901 Long term (current) use of anticoagulants: Secondary | ICD-10-CM | POA: Insufficient documentation

## 2021-11-19 DIAGNOSIS — Z853 Personal history of malignant neoplasm of breast: Secondary | ICD-10-CM | POA: Diagnosis not present

## 2021-11-19 DIAGNOSIS — E1165 Type 2 diabetes mellitus with hyperglycemia: Secondary | ICD-10-CM | POA: Diagnosis not present

## 2021-11-19 DIAGNOSIS — Z79899 Other long term (current) drug therapy: Secondary | ICD-10-CM | POA: Diagnosis not present

## 2021-11-19 DIAGNOSIS — I10 Essential (primary) hypertension: Secondary | ICD-10-CM | POA: Diagnosis not present

## 2021-11-19 DIAGNOSIS — Z7984 Long term (current) use of oral hypoglycemic drugs: Secondary | ICD-10-CM | POA: Insufficient documentation

## 2021-11-19 DIAGNOSIS — K219 Gastro-esophageal reflux disease without esophagitis: Secondary | ICD-10-CM | POA: Diagnosis not present

## 2021-11-19 DIAGNOSIS — N201 Calculus of ureter: Secondary | ICD-10-CM

## 2021-11-19 DIAGNOSIS — I4891 Unspecified atrial fibrillation: Secondary | ICD-10-CM | POA: Insufficient documentation

## 2021-11-19 DIAGNOSIS — J45909 Unspecified asthma, uncomplicated: Secondary | ICD-10-CM | POA: Diagnosis not present

## 2021-11-19 DIAGNOSIS — I498 Other specified cardiac arrhythmias: Secondary | ICD-10-CM

## 2021-11-19 DIAGNOSIS — Z87891 Personal history of nicotine dependence: Secondary | ICD-10-CM | POA: Diagnosis not present

## 2021-11-19 DIAGNOSIS — Z01818 Encounter for other preprocedural examination: Secondary | ICD-10-CM

## 2021-11-19 HISTORY — DX: Presence of spectacles and contact lenses: Z97.3

## 2021-11-19 HISTORY — DX: Unspecified osteoarthritis, unspecified site: M19.90

## 2021-11-19 HISTORY — PX: CYSTOSCOPY WITH RETROGRADE PYELOGRAM, URETEROSCOPY AND STENT PLACEMENT: SHX5789

## 2021-11-19 HISTORY — PX: HOLMIUM LASER APPLICATION: SHX5852

## 2021-11-19 LAB — GLUCOSE, CAPILLARY: Glucose-Capillary: 138 mg/dL — ABNORMAL HIGH (ref 70–99)

## 2021-11-19 LAB — PROTIME-INR
INR: 1.1 (ref 0.8–1.2)
Prothrombin Time: 14.2 seconds (ref 11.4–15.2)

## 2021-11-19 SURGERY — CYSTOURETEROSCOPY, WITH RETROGRADE PYELOGRAM AND STENT INSERTION
Anesthesia: General | Laterality: Right

## 2021-11-19 MED ORDER — HYDROCODONE-ACETAMINOPHEN 10-325 MG PO TABS
1.0000 | ORAL_TABLET | Freq: Four times a day (QID) | ORAL | 0 refills | Status: AC | PRN
Start: 2021-11-19 — End: ?

## 2021-11-19 MED ORDER — PHENYLEPHRINE 80 MCG/ML (10ML) SYRINGE FOR IV PUSH (FOR BLOOD PRESSURE SUPPORT)
PREFILLED_SYRINGE | INTRAVENOUS | Status: DC | PRN
  Administered 2021-11-19: 80 ug via INTRAVENOUS

## 2021-11-19 MED ORDER — PROPOFOL 10 MG/ML IV BOLUS
INTRAVENOUS | Status: DC | PRN
  Administered 2021-11-19: 120 mg via INTRAVENOUS

## 2021-11-19 MED ORDER — FENTANYL CITRATE PF 50 MCG/ML IJ SOSY
25.0000 ug | PREFILLED_SYRINGE | INTRAMUSCULAR | Status: DC | PRN
  Administered 2021-11-19: 50 ug via INTRAVENOUS

## 2021-11-19 MED ORDER — DEXAMETHASONE SODIUM PHOSPHATE 10 MG/ML IJ SOLN
INTRAMUSCULAR | Status: AC
  Filled 2021-11-19: qty 1

## 2021-11-19 MED ORDER — DEXAMETHASONE SODIUM PHOSPHATE 4 MG/ML IJ SOLN
INTRAMUSCULAR | Status: DC | PRN
  Administered 2021-11-19: 8 mg via INTRAVENOUS

## 2021-11-19 MED ORDER — ONDANSETRON HCL 4 MG/2ML IJ SOLN
INTRAMUSCULAR | Status: DC | PRN
  Administered 2021-11-19: 4 mg via INTRAVENOUS

## 2021-11-19 MED ORDER — ONDANSETRON HCL 4 MG/2ML IJ SOLN: INTRAMUSCULAR | Status: DC | PRN

## 2021-11-19 MED ORDER — ONDANSETRON HCL 4 MG/2ML IJ SOLN: 4.0000 mg | Freq: Once | INTRAMUSCULAR | Status: DC | PRN

## 2021-11-19 MED ORDER — LIDOCAINE HCL (CARDIAC) PF 100 MG/5ML IV SOSY
PREFILLED_SYRINGE | INTRAVENOUS | Status: DC | PRN
  Administered 2021-11-19: 80 mg via INTRAVENOUS

## 2021-11-19 MED ORDER — ONDANSETRON HCL 4 MG/2ML IJ SOLN
INTRAMUSCULAR | Status: AC
  Filled 2021-11-19: qty 2

## 2021-11-19 MED ORDER — PHENYLEPHRINE 80 MCG/ML (10ML) SYRINGE FOR IV PUSH (FOR BLOOD PRESSURE SUPPORT)
PREFILLED_SYRINGE | INTRAVENOUS | Status: AC
  Filled 2021-11-19: qty 10

## 2021-11-19 MED ORDER — LACTATED RINGERS IV SOLN: INTRAVENOUS | Status: DC

## 2021-11-19 MED ORDER — LIDOCAINE HCL (PF) 2 % IJ SOLN
INTRAMUSCULAR | Status: AC
  Filled 2021-11-19: qty 5

## 2021-11-19 MED ORDER — PROPOFOL 10 MG/ML IV BOLUS
INTRAVENOUS | Status: AC
  Filled 2021-11-19: qty 20

## 2021-11-19 MED ORDER — ACETAMINOPHEN 500 MG PO TABS
1000.0000 mg | ORAL_TABLET | Freq: Once | ORAL | Status: AC
Start: 2021-11-19 — End: 2021-11-19
  Administered 2021-11-19: 1000 mg via ORAL
  Filled 2021-11-19: qty 2

## 2021-11-19 MED ORDER — FENTANYL CITRATE PF 50 MCG/ML IJ SOSY
PREFILLED_SYRINGE | INTRAMUSCULAR | Status: AC
  Filled 2021-11-19: qty 1

## 2021-11-19 MED ORDER — CEPHALEXIN 500 MG PO CAPS: 500.0000 mg | ORAL_CAPSULE | Freq: Two times a day (BID) | ORAL | 0 refills | Status: AC

## 2021-11-19 MED ORDER — GENTAMICIN SULFATE 40 MG/ML IJ SOLN
5.0000 mg/kg | INTRAVENOUS | Status: AC
  Administered 2021-11-19: 260 mg via INTRAVENOUS
  Filled 2021-11-19: qty 6.5

## 2021-11-19 MED ORDER — SODIUM CHLORIDE 0.9 % IR SOLN
Status: DC | PRN
  Administered 2021-11-19: 6000 mL

## 2021-11-19 SURGICAL SUPPLY — 22 items
BASKET LASER NITINOL 1.9FR (BASKET) ×1 IMPLANT
BSKT STON RTRVL 120 1.9FR (BASKET) ×1
CATH URETL OPEN END 6FR 70 (CATHETERS) IMPLANT
CLOTH BEACON ORANGE TIMEOUT ST (SAFETY) ×3 IMPLANT
FIBER LASER FLEXIVA 365 (UROLOGICAL SUPPLIES) IMPLANT
GLOVE BIO SURGEON STRL SZ7.5 (GLOVE) ×3 IMPLANT
GOWN STRL REUS W/TWL LRG LVL3 (GOWN DISPOSABLE) ×3 IMPLANT
GUIDEWIRE ANG ZIPWIRE 038X150 (WIRE) ×3 IMPLANT
GUIDEWIRE STR DUAL SENSOR (WIRE) ×3 IMPLANT
IV NS 1000ML (IV SOLUTION) ×2
IV NS 1000ML BAXH (IV SOLUTION) ×2 IMPLANT
IV NS IRRIG 3000ML ARTHROMATIC (IV SOLUTION) ×3 IMPLANT
KIT TURNOVER CYSTO (KITS) ×3 IMPLANT
PACK CYSTO (CUSTOM PROCEDURE TRAY) ×3 IMPLANT
SHEATH NAVIGATOR HD 11/13X28 (SHEATH) ×1 IMPLANT
STENT POLARIS 5FRX22 (STENTS) ×1 IMPLANT
SYR 10ML LL (SYRINGE) ×3 IMPLANT
TRACTIP FLEXIVA PULS ID 200XHI (Laser) IMPLANT
TRACTIP FLEXIVA PULSE ID 200 (Laser) ×2
TUBE CONNECTING 12X1/4 (SUCTIONS) ×3 IMPLANT
TUBE FEEDING 8FR 16IN STR KANG (MISCELLANEOUS) IMPLANT
TUBING UROLOGY SET (TUBING) ×3 IMPLANT

## 2021-11-19 NOTE — Op Note (Signed)
NAME: [name], CANNAN MEDICAL RECORD NO: [id number] ACCOUNT NO: [id number] DATE OF BIRTH: April 25, 1951 FACILITY: [facility] LOCATION: [facility] PHYSICIAN: Alexis Frock, MD  Operative Report   DATE OF PROCEDURE: 11/19/2021  PREOPERATIVE DIAGNOSIS:  Right ureteral stone, history of urosepsis.  PROCEDURES PERFORMED:   1.  Cystoscopy, right retrograde pyelogram interpretation. 2.  Right ureteroscopy with laser lithotripsy. 3.  Exchange of right ureteral stent.  ESTIMATED BLOOD LOSS:  Nil.  COMPLICATIONS:  None.  SPECIMEN:  Right ureteral stone fragments for composition analysis.  FINDINGS:   1.  Retrograde positioning of prior ureteral stone into kidney. 2.  Complete resolution of all accessible stone fragments larger than one-third mm following laser lithotripsy and basket extraction. 3.  Successful replacement of right ureteral stent, proximal end in the renal pelvis, distal end in urinary bladder.  INDICATIONS:  The patient is a 71 year old lady with history of inflammatory arthritis, on immune modulators.  He was found on workup of colicky flank pain, fevers and tachycardia to have a right proximal ureteral stone.  Overall, picture was concerning  for urosepsis last month.  She underwent temporizing measure with placement of a stent.  Intravenous antibiotics and cleared her infectious parameters.  She now presents for definitive stone management with ureteroscopy.  Informed consent was obtained  and placed in medical record.  PROCEDURE IN DETAIL:  The patient is being identified and verified, procedure being right ureteroscopic stone manipulation was confirmed.  Procedure timeout was performed.  Intravenous antibiotics were administered and general LMA anesthesia was induced.   The patient was placed into a low lithotomy position.  Sterile field was created, prepped and draped the patient's vagina, introitus, and proximal thighs using iodine.  Cystourethroscopy was performed using  21-French rigid cystoscope with offset lens.   Inspection of urinary bladder revealed distal end of right ureteral stent in situ.  It was grasped, brought to the level of the urethral meatus and a ZIPwire was advanced to lower pole, exchanged for an open-ended catheter and right retrograde pyelogram  was obtained.  The right retrograde pyelogram demonstrated right ureter, single system right kidney.  There was a questionable filling defect in the kidney consistent with known stone.  A ZIPwire was advanced set aside as a safety wire.  An 8-French feeding tube placed  in the urinary bladder for pressure release and semirigid ureteroscopy was performed of the entire length of the right ureter alongside a separate sensor working wire.  No mucosal abnormalities were found.  The semirigid scope was exchanged for a short  length ureteral access sheath to the level of the proximal ureter using continuous fluoroscopic guidance and flexible digital ureteroscopy was performed of the proximal right ureter and systematic inspection of the right kidney. As expected the previous  right ureteral stone had been retrograde positioned to the level of the kidney.  This was navigated into an upper pole calyx to allow for nonacute angulation. Stone appeared to be too large for simple basketing.  As such, holmium laser energy applied to  the stone using a setting of 0.2 joules and 20 Hz and the stone was fragmented into approximately three smaller pieces that were then amenable to basketing and removed in their entirety and set aside for composition analysis.  Following this, complete  resolution of all accessible stone fragments larger than one-third mm.  Entire kidney was once again inspected.  No residual or additional stones were noted.  Access sheath was removed under continuous vision.  No significant mucosal abnormalities were  found.  Given the patient's history of prior infectious parameters, it was felt that brief  interval stenting with tethered stent would be most prudent.  As such, a new 5 x 22 Polaris type stent was placed over the safety wire using fluoroscopic guidance.   Good proximal and distal planes were noted.  Tether was left in place, trimmed to length and tucked per vagina and the procedure was terminated.  The patient tolerated the procedure well, no immediate periprocedural complications.  The patient was  taken to postanesthesia care in stable condition.  Plan for discharge home.   PUS D: 11/19/2021 12:42:53 pm T: 11/19/2021 2:21:00 pm  JOB: [id number]/ [id number]

## 2021-11-19 NOTE — Brief Op Note (Signed)
11/19/2021  12:38 PM  PATIENT:  Aldean Jewett  71 y.o. female  PRE-OPERATIVE DIAGNOSIS:  RIGHT URETERAL STONE  POST-OPERATIVE DIAGNOSIS:  RIGHT URETERAL STONE  PROCEDURE:  Procedure(s) with comments: CYSTOSCOPY WITH RETROGRADE PYELOGRAM, URETEROSCOPY AND STENT EXCHANGE (Right) - 1 HR HOLMIUM LASER APPLICATION (Right)  SURGEON:  Surgeon(s) and Role:    Alexis Frock, MD - Primary  PHYSICIAN ASSISTANT:   ASSISTANTS: none   ANESTHESIA:   general  EBL:  minimal   BLOOD ADMINISTERED:none  DRAINS: none   LOCAL MEDICATIONS USED:  NONE  SPECIMEN:  Source of Specimen:  Rt ureteral stone fragments  DISPOSITION OF SPECIMEN:  [facility] for compositional analysis  COUNTS:  YES  TOURNIQUET:  * No tourniquets in log *  DICTATION: .Other Dictation: Dictation Number [id number]  PLAN OF CARE: Discharge to home after PACU  PATIENT DISPOSITION:  PACU - hemodynamically stable.   Delay start of Pharmacological VTE agent (>24hrs) due to surgical blood loss or risk of bleeding: yes

## 2021-11-19 NOTE — Anesthesia Postprocedure Evaluation (Signed)
Anesthesia Post Note  Patient: [name]  Procedure(s) Performed: CYSTOSCOPY WITH RETROGRADE PYELOGRAM, URETEROSCOPY AND STENT EXCHANGE (Right) HOLMIUM LASER APPLICATION (Right)     Patient location during evaluation: PACU Anesthesia Type: General Level of consciousness: awake and alert Pain management: pain level controlled Vital Signs Assessment: post-procedure vital signs reviewed and stable Respiratory status: spontaneous breathing, nonlabored ventilation, respiratory function stable and patient connected to nasal cannula oxygen Cardiovascular status: blood pressure returned to baseline and stable Postop Assessment: no apparent nausea or vomiting Anesthetic complications: no   No notable events documented.  Last Vitals:  Vitals:   11/19/21 1330 11/19/21 1345  BP: (!) 148/76 (!) 148/74  Pulse: 97 95  Resp: 20 (!) 22  Temp: 36.7 C   SpO2: 96% 98%    Last Pain:  Vitals:   11/19/21 1345  TempSrc:   PainSc: [name]

## 2021-11-19 NOTE — Transfer of Care (Signed)
Immediate Anesthesia Transfer of Care Note  Patient: [name]  Procedure(s) Performed: CYSTOSCOPY WITH RETROGRADE PYELOGRAM, URETEROSCOPY AND STENT EXCHANGE (Right) HOLMIUM LASER APPLICATION (Right)  Patient Location: PACU  Anesthesia Type:General  Level of Consciousness: awake  Airway & Oxygen Therapy: Patient Spontanous Breathing  Post-op Assessment: Report given to RN  Post vital signs: stable  Last Vitals:  Vitals Value Taken Time  BP 154/95 11/19/21 1248  Temp 36.9 C 11/19/21 1248  Pulse 93 11/19/21 1254  Resp 14 11/19/21 1254  SpO2 100 % 11/19/21 1254  Vitals shown include unvalidated device data.  Last Pain:  Vitals:   11/19/21 1248  TempSrc:   PainSc: 0-No pain         Complications: No notable events documented.

## 2021-11-19 NOTE — Anesthesia Procedure Notes (Signed)
Procedure Name: LMA Insertion Date/Time: 11/19/2021 12:07 PM  Performed by: Delrose Rohwer, Forest Gleason, CRNAPre-anesthesia Checklist: Patient identified, Emergency Drugs available, Suction available, Patient being monitored and Timeout performed Patient Re-evaluated:Patient Re-evaluated prior to induction Oxygen Delivery Method: Circle system utilized Preoxygenation: Pre-oxygenation with 100% oxygen Induction Type: IV induction Ventilation: Mask ventilation without difficulty LMA: LMA with gastric port inserted Number of attempts: 1 Tube secured with: Tape Dental Injury: Teeth and Oropharynx as per pre-operative assessment

## 2021-11-19 NOTE — H&P (Signed)
[name] is an 71 y.o. female.    Chief Complaint: Pre-OP RIGHT Ureteroscopic Stone Manipulation  HPI:   1 - RIGHT Ureteral Stone - 31m Rt prox stone that is solitary by ER CT 09/2021 on eval malaiose and flank pain. Previous stones treates with liothotripsy per report. Temporized with Rt ureteral stent placement.    2 - HIstory of Urosepsis - malaise, lactric acidosis, leukocytosis, tachycardia, bacterureia c/w urosepssi from obstructing stone as per above 09/2021. UCX all non-conal / no growth. Resolved all infectious parameters on empiric rocephin.  She is immune supressed with plaquenil for rheumatoid and diabetic.   PMH sig for obesity, AFib/Coumdin, DM2, RA on immune modulators.   Today "BIsra is seen to proceed with RIGHT ureteroscopic stone manipulation. NO interval fevers.   Past Medical History:  Diagnosis Date  . Asthma, mild    Intermittent  . Atrial fibrillation ([facility]    Feb 2023  . [place]   . Chronic back pain   . Echocardiogram abnormal 09/2008   With mild aortic valve and mitral valve regurgitation  . Heart murmur   . History of left breast cancer 1999  . Hyperlipidemia   . Hypertension   . Hypothyroidism   . IBS (irritable bowel syndrome)   . Lactose intolerance   . Osteoporosis   . Peptic ulcer disease   . rheumatoid Arthritis   . Type II diabetes mellitus ([place]    [facility] 08-2012 checks cbg bid  . Ulcer, duodenal, acute, with obstruction 1999  . Wears glasses     Past Surgical History:  Procedure Laterality Date  . ABDOMINAL HYSTERECTOMY    . BREAST IMPLANT REMOVAL     Breast implant infected removed implant 06/06 infected and removed 11/05  . BREAST LUMPECTOMY Left 12/1997   Left breast  . BREAST RECONSTRUCTION    . CARDIOVERSION N/A 08/11/2021   Procedure: CARDIOVERSION;  Surgeon: TRex Kras DO;  Location: [facility];  Service: Cardiovascular;  Laterality: N/A;  . CHOLECYSTECTOMY    . CYSTOSCOPY W/ URETERAL STENT PLACEMENT Left  01/19/2018   Procedure: CYSTOSCOPY WITH RETROGRADE PYELOGRAM/URETERAL STENT PLACEMENT;  Surgeon: DFranchot Gallo MD;  Location: [facility];  Service: Urology;  Laterality: Left;  . CYSTOSCOPY W/ URETERAL STENT PLACEMENT Right 10/27/2021   Procedure: CYSTOSCOPY WITH RETROGRADE PYELOGRAM/URETERAL STENT PLACEMENT;  Surgeon: MAlexis Frock MD;  [facility]  Service: Urology;  Laterality: Right;  . CYSTOSCOPY WITH STENT PLACEMENT  10/27/2021   cone  . EXTRACORPOREAL SHOCK WAVE LITHOTRIPSY Left 02/15/2018   Procedure: LEFT EXTRACORPOREAL SHOCK WAVE LITHOTRIPSY (ESWL);  Surgeon: MCleon Gustin MD;  Location: [facility];  Service: Urology;  Laterality: Left;  .Marland KitchenMASTECTOMY  07/2003   Left  . other     Left Arm FX  . OTHER SURGICAL HISTORY     Hysterectomy- Ovaries intact   . OTHER SURGICAL HISTORY     Fracture Left HIp pinning sx done  . PARTIAL GASTRECTOMY  1999   due to ulcers, earlier sx done on stomach also    Family History  Problem Relation Age of Onset  . Heart failure Mother   . Osteoporosis Mother   . Heart attack Father   . Hypertension Father   . Diabetes Father   . Diabetes Mellitus II Father   . Diabetes Sister   . Thyroid cancer Sister   . Diabetes Brother   . Colon cancer Neg Hx   . Esophageal cancer Neg Hx   . Stomach cancer Neg  Hx    Social History:  reports that she quit smoking about 21 years ago. Her smoking use included cigarettes. She has a 5.00 pack-year smoking history. She has never used smokeless tobacco. She reports that she does not drink alcohol and does not use drugs.  Allergies:  Allergies  Allergen Reactions  . Fish Allergy Anaphylaxis and Hives  . Amaryl [Glimepiride] Itching  . Avalide [Irbesartan-Hydrochlorothiazide] Itching  . Avandia [Rosiglitazone] Itching  . Byetta 10 Mcg Pen [Exenatide] Itching  . Codeine Itching    Other reaction(s): Unknown  . Demerol [Meperidine Hcl] Itching  . Glucovance [Glyburide-Metformin] Itching  .  Iodinated Contrast Media Itching    Other reaction(s):itching  . Lisinopril Itching  . Naprosyn [Naproxen] Hives  . Penicillins Itching and Other (See Comments)    Has patient had a PCN reaction causing immediate rash, facial/tongue/throat swelling, SOB or lightheadedness with hypotension: no Has patient had a PCN reaction causing severe rash involving mucus membranes or skin necrosis: no Has patient had a PCN reaction that required hospitalization: yes Has patient had a PCN reaction occurring within the last 10 years: no If all of the above answers are "NO", then may proceed with Cephalosporin use.    No medications prior to admission.    No results found for this or any previous visit (from the past 48 hour(s)). No results found.  Review of Systems  Constitutional: Negative.  Negative for chills and fever.  Genitourinary:  Positive for urgency.   Height '5\' 3"'$  (1.6 m), weight 50.3 kg. Physical Exam Vitals reviewed.  HENT:     Head: Normocephalic.  Eyes:     Pupils: Pupils are equal, round, and reactive to light.  Cardiovascular:     Rate and Rhythm: Normal rate.  Pulmonary:     Effort: Pulmonary effort is normal.  Abdominal:     General: Abdomen is flat.  Musculoskeletal:     Cervical back: Normal range of motion.  Skin:    General: Skin is warm.  Neurological:     General: No focal deficit present.     Mental Status: She is alert.  Psychiatric:        Mood and Affect: Mood normal.     Assessment/Plan  Proceed as planned with RIGHT Ureteroscopic stone manipulation. Risks, benefits, alternatives, expected peri-op course discussed previously and reiterated today.   Alexis Frock, MD 11/19/2021, 7:10 AM

## 2021-11-19 NOTE — Discharge Instructions (Addendum)
1 - You may have urinary urgency (bladder spasms) and bloody urine on / off with stent in place. This is normal.  2 - Remove tethered stent on Monday morning at home by pulling on string, then blue-white plastic tubing, and discarding. Office is open Monday if any issues arise.   3 - Call MD or go to ER for fever >102, severe pain / nausea / vomiting not relieved by medications, or acute change in medical status  

## 2021-11-19 NOTE — Anesthesia Preprocedure Evaluation (Addendum)
Anesthesia Evaluation  Patient identified by MRN, date of birth, ID band Patient awake    Reviewed: Allergy & Precautions, NPO status , Patient's Chart, lab work & pertinent test results  Airway Mallampati: III  TM Distance: >3 FB Neck ROM: Full    Dental  (+) Dental Advisory Given, Poor Dentition, Missing   Pulmonary asthma , former smoker,  Former smoker, quit smoking 2002   Pulmonary exam normal breath sounds clear to auscultation       Cardiovascular hypertension, Pt. on medications pulmonary hypertension (mod pHTN)+ dysrhythmias (coumadin) Atrial Fibrillation + Valvular Problems/Murmurs (mod AS, mod AI, mod MS, severe MR) MR, AS and AI  Rhythm:Regular Rate:Normal + Systolic murmurs Echo 05/6107 Echocardiogram 08/25/2021:  Normal LV systolic function with visual EF 55-60%. Left ventricle cavity  is small in size. Mild basal asymmetric hypertrophy of the left ventricle.  Mild concentric LVH. Normal global wall motion. Mild flattening of the  septum may be due to bundle branch block vs RV pressure overload.  Indeterminate diastolic filling pattern due to mitral apparatus  calcification, elevated LAP. Calculated EF 41%.  Left atrial cavity is severely dilated at 4.9 cm.  Mild calcification of the aortic valve annulus. Mildly restricted aortic  valve leaflets. Trileaflet aortic valve. Mild aortic stenosis. Moderate  (Grade II) aortic regurgitation. Peak velocity 2.53 m/s, Peak Pressure  Gradient 25.6 mmHg, Mean Gradient 15.6 mmHg, AVA 1.0 cm, Dimensionless  Index 0.38.  Severe calcification of the mitral valve annulus. Mild mitral valve  leaflet calcification. Moderately restricted mitral valve leaflets.  Moderate mitral stenosis. Severe (Grade III) mitral regurgitation. Mitral  valve peak pressure gradient 26.6, mean pressure gradient 13.8 mmHg,  calculated mitral valve area by pressure half-time is 1.3 cm. Marked  elevation  in the velocity is due to severe MR and flow.  Structurally normal tricuspid valve. Moderate tricuspid regurgitation.  Moderate pulmonary hypertension. RVSP measures 44 mmHg.     Neuro/Psych negative neurological ROS  negative psych ROS   GI/Hepatic Neg liver ROS, PUD, GERD  Medicated,  Endo/Other  diabetes, Poorly Controlled, Type 2, Oral Hypoglycemic AgentsHypothyroidism a1c 7.6  Renal/GU negative Renal ROS  negative genitourinary   Musculoskeletal  (+) Arthritis , Osteoarthritis and Rheumatoid disorders,  Chronic pain- morphine BID   Abdominal   Peds  Hematology  (+) Blood dyscrasia (Coumadin), ,   Anesthesia Other Findings Hx L breast ca   Reproductive/Obstetrics negative OB ROS                          Anesthesia Physical Anesthesia Plan  ASA: 4  Anesthesia Plan: General   Post-op Pain Management: Tylenol PO (pre-op)*   Induction: Intravenous  PONV Risk Score and Plan: 3 and Ondansetron, Dexamethasone, Midazolam and Treatment may vary due to age or medical condition  Airway Management Planned: LMA  Additional Equipment: None  Intra-op Plan:   Post-operative Plan: Extubation in OR  Informed Consent: I have reviewed the patients History and Physical, chart, labs and discussed the procedure including the risks, benefits and alternatives for the proposed anesthesia with the patient or authorized representative who has indicated his/her understanding and acceptance.     Dental advisory given  Plan Discussed with: CRNA  Anesthesia Plan Comments:        Anesthesia Quick Evaluation

## 2021-11-20 ENCOUNTER — Encounter (HOSPITAL_COMMUNITY): Payer: Self-pay | Admitting: Urology

## 2021-11-24 ENCOUNTER — Other Ambulatory Visit: Payer: Self-pay | Admitting: Cardiology

## 2021-12-17 ENCOUNTER — Other Ambulatory Visit: Payer: Self-pay | Admitting: Cardiology

## 2021-12-17 DIAGNOSIS — I10 Essential (primary) hypertension: Secondary | ICD-10-CM

## 2021-12-26 ENCOUNTER — Other Ambulatory Visit: Payer: Self-pay | Admitting: Cardiology

## 2021-12-26 DIAGNOSIS — I480 Paroxysmal atrial fibrillation: Secondary | ICD-10-CM

## 2021-12-31 ENCOUNTER — Ambulatory Visit: Payer: Medicare Other | Admitting: Cardiology

## 2022-01-05 DIAGNOSIS — I10 Essential (primary) hypertension: Secondary | ICD-10-CM | POA: Diagnosis not present

## 2022-01-07 DIAGNOSIS — M069 Rheumatoid arthritis, unspecified: Secondary | ICD-10-CM | POA: Diagnosis not present

## 2022-01-07 DIAGNOSIS — E1169 Type 2 diabetes mellitus with other specified complication: Secondary | ICD-10-CM | POA: Diagnosis not present

## 2022-01-07 DIAGNOSIS — G894 Chronic pain syndrome: Secondary | ICD-10-CM | POA: Diagnosis not present

## 2022-01-07 DIAGNOSIS — I480 Paroxysmal atrial fibrillation: Secondary | ICD-10-CM | POA: Diagnosis not present

## 2022-01-07 DIAGNOSIS — Z23 Encounter for immunization: Secondary | ICD-10-CM | POA: Diagnosis not present

## 2022-01-07 DIAGNOSIS — F324 Major depressive disorder, single episode, in partial remission: Secondary | ICD-10-CM | POA: Diagnosis not present

## 2022-01-07 DIAGNOSIS — E039 Hypothyroidism, unspecified: Secondary | ICD-10-CM | POA: Diagnosis not present

## 2022-01-07 DIAGNOSIS — E7800 Pure hypercholesterolemia, unspecified: Secondary | ICD-10-CM | POA: Diagnosis not present

## 2022-01-07 DIAGNOSIS — I10 Essential (primary) hypertension: Secondary | ICD-10-CM | POA: Diagnosis not present

## 2022-01-07 DIAGNOSIS — K219 Gastro-esophageal reflux disease without esophagitis: Secondary | ICD-10-CM | POA: Diagnosis not present

## 2022-01-13 ENCOUNTER — Other Ambulatory Visit: Payer: Self-pay | Admitting: Cardiology

## 2022-01-13 DIAGNOSIS — I10 Essential (primary) hypertension: Secondary | ICD-10-CM

## 2022-01-14 DIAGNOSIS — Z79899 Other long term (current) drug therapy: Secondary | ICD-10-CM | POA: Diagnosis not present

## 2022-01-14 DIAGNOSIS — M0579 Rheumatoid arthritis with rheumatoid factor of multiple sites without organ or systems involvement: Secondary | ICD-10-CM | POA: Diagnosis not present

## 2022-01-14 DIAGNOSIS — R5383 Other fatigue: Secondary | ICD-10-CM | POA: Diagnosis not present

## 2022-01-20 ENCOUNTER — Ambulatory Visit: Payer: Medicare Other | Admitting: Cardiology

## 2022-01-28 DIAGNOSIS — G894 Chronic pain syndrome: Secondary | ICD-10-CM | POA: Diagnosis not present

## 2022-01-28 DIAGNOSIS — M810 Age-related osteoporosis without current pathological fracture: Secondary | ICD-10-CM | POA: Diagnosis not present

## 2022-01-28 DIAGNOSIS — M0579 Rheumatoid arthritis with rheumatoid factor of multiple sites without organ or systems involvement: Secondary | ICD-10-CM | POA: Diagnosis not present

## 2022-01-28 DIAGNOSIS — M1991 Primary osteoarthritis, unspecified site: Secondary | ICD-10-CM | POA: Diagnosis not present

## 2022-01-28 DIAGNOSIS — Z681 Body mass index (BMI) 19 or less, adult: Secondary | ICD-10-CM | POA: Diagnosis not present

## 2022-01-28 DIAGNOSIS — Z79899 Other long term (current) drug therapy: Secondary | ICD-10-CM | POA: Diagnosis not present

## 2022-01-28 DIAGNOSIS — M79644 Pain in right finger(s): Secondary | ICD-10-CM | POA: Diagnosis not present

## 2022-02-10 ENCOUNTER — Other Ambulatory Visit: Payer: Self-pay | Admitting: Cardiology

## 2022-02-10 DIAGNOSIS — I10 Essential (primary) hypertension: Secondary | ICD-10-CM

## 2022-02-17 DIAGNOSIS — M0579 Rheumatoid arthritis with rheumatoid factor of multiple sites without organ or systems involvement: Secondary | ICD-10-CM | POA: Diagnosis not present

## 2022-03-10 ENCOUNTER — Other Ambulatory Visit: Payer: Self-pay | Admitting: Cardiology

## 2022-03-10 DIAGNOSIS — I10 Essential (primary) hypertension: Secondary | ICD-10-CM

## 2022-03-17 DIAGNOSIS — M0579 Rheumatoid arthritis with rheumatoid factor of multiple sites without organ or systems involvement: Secondary | ICD-10-CM | POA: Diagnosis not present

## 2022-03-17 DIAGNOSIS — Z79899 Other long term (current) drug therapy: Secondary | ICD-10-CM | POA: Diagnosis not present

## 2022-03-24 ENCOUNTER — Other Ambulatory Visit: Payer: Self-pay | Admitting: Cardiology

## 2022-03-24 DIAGNOSIS — I10 Essential (primary) hypertension: Secondary | ICD-10-CM

## 2022-04-04 DIAGNOSIS — M810 Age-related osteoporosis without current pathological fracture: Secondary | ICD-10-CM | POA: Diagnosis not present

## 2022-04-14 DIAGNOSIS — M0579 Rheumatoid arthritis with rheumatoid factor of multiple sites without organ or systems involvement: Secondary | ICD-10-CM | POA: Diagnosis not present

## 2022-04-20 ENCOUNTER — Other Ambulatory Visit: Payer: Self-pay | Admitting: Cardiology

## 2022-04-20 DIAGNOSIS — I10 Essential (primary) hypertension: Secondary | ICD-10-CM

## 2022-04-27 ENCOUNTER — Other Ambulatory Visit: Payer: Self-pay | Admitting: Cardiology

## 2022-04-27 DIAGNOSIS — I480 Paroxysmal atrial fibrillation: Secondary | ICD-10-CM

## 2022-05-17 ENCOUNTER — Other Ambulatory Visit: Payer: Self-pay | Admitting: Cardiology

## 2022-05-17 DIAGNOSIS — I10 Essential (primary) hypertension: Secondary | ICD-10-CM

## 2022-05-30 DIAGNOSIS — Z79899 Other long term (current) drug therapy: Secondary | ICD-10-CM | POA: Diagnosis not present

## 2022-05-30 DIAGNOSIS — M0579 Rheumatoid arthritis with rheumatoid factor of multiple sites without organ or systems involvement: Secondary | ICD-10-CM | POA: Diagnosis not present

## 2022-06-11 ENCOUNTER — Other Ambulatory Visit: Payer: Self-pay | Admitting: Cardiology

## 2022-06-11 DIAGNOSIS — I10 Essential (primary) hypertension: Secondary | ICD-10-CM

## 2022-06-27 DIAGNOSIS — M0579 Rheumatoid arthritis with rheumatoid factor of multiple sites without organ or systems involvement: Secondary | ICD-10-CM | POA: Diagnosis not present

## 2022-07-15 DIAGNOSIS — M542 Cervicalgia: Secondary | ICD-10-CM | POA: Diagnosis not present

## 2022-07-15 DIAGNOSIS — E039 Hypothyroidism, unspecified: Secondary | ICD-10-CM | POA: Diagnosis not present

## 2022-07-15 DIAGNOSIS — G894 Chronic pain syndrome: Secondary | ICD-10-CM | POA: Diagnosis not present

## 2022-07-15 DIAGNOSIS — I10 Essential (primary) hypertension: Secondary | ICD-10-CM | POA: Diagnosis not present

## 2022-07-15 DIAGNOSIS — M069 Rheumatoid arthritis, unspecified: Secondary | ICD-10-CM | POA: Diagnosis not present

## 2022-07-15 DIAGNOSIS — E1169 Type 2 diabetes mellitus with other specified complication: Secondary | ICD-10-CM | POA: Diagnosis not present

## 2022-07-15 DIAGNOSIS — I480 Paroxysmal atrial fibrillation: Secondary | ICD-10-CM | POA: Diagnosis not present

## 2022-07-15 DIAGNOSIS — Z853 Personal history of malignant neoplasm of breast: Secondary | ICD-10-CM | POA: Diagnosis not present

## 2022-07-15 DIAGNOSIS — E7800 Pure hypercholesterolemia, unspecified: Secondary | ICD-10-CM | POA: Diagnosis not present

## 2022-07-15 DIAGNOSIS — F324 Major depressive disorder, single episode, in partial remission: Secondary | ICD-10-CM | POA: Diagnosis not present

## 2022-08-17 DIAGNOSIS — G894 Chronic pain syndrome: Secondary | ICD-10-CM | POA: Diagnosis not present

## 2022-08-17 DIAGNOSIS — Z681 Body mass index (BMI) 19 or less, adult: Secondary | ICD-10-CM | POA: Diagnosis not present

## 2022-08-17 DIAGNOSIS — M1991 Primary osteoarthritis, unspecified site: Secondary | ICD-10-CM | POA: Diagnosis not present

## 2022-08-17 DIAGNOSIS — M546 Pain in thoracic spine: Secondary | ICD-10-CM | POA: Diagnosis not present

## 2022-08-17 DIAGNOSIS — Z79899 Other long term (current) drug therapy: Secondary | ICD-10-CM | POA: Diagnosis not present

## 2022-08-17 DIAGNOSIS — M0579 Rheumatoid arthritis with rheumatoid factor of multiple sites without organ or systems involvement: Secondary | ICD-10-CM | POA: Diagnosis not present

## 2022-08-17 DIAGNOSIS — M810 Age-related osteoporosis without current pathological fracture: Secondary | ICD-10-CM | POA: Diagnosis not present

## 2022-08-22 DIAGNOSIS — Z79899 Other long term (current) drug therapy: Secondary | ICD-10-CM | POA: Diagnosis not present

## 2022-08-22 DIAGNOSIS — M0579 Rheumatoid arthritis with rheumatoid factor of multiple sites without organ or systems involvement: Secondary | ICD-10-CM | POA: Diagnosis not present

## 2022-09-18 IMAGING — CT CT ABD-PELV W/O CM
2 of 4 series · 15 of 46 positions shown, 17 images · non-contrast
Comparison: Prior studies from 7648 and from 0779.

CLINICAL DATA: Weight loss, abdominal pain and nausea, history of
breast cancer

EXAM:
CT ABDOMEN AND PELVIS WITHOUT CONTRAST
TECHNIQUE: Multidetector CT imaging of the abdomen and pelvis was performed
following the standard protocol without IV contrast.

[Series 2: axial st · axial · 0.82mm/px · z∈[-377,+48]mm · 12 of 97 slices shown, 14 images]
[im 6/97  soft-tissue]
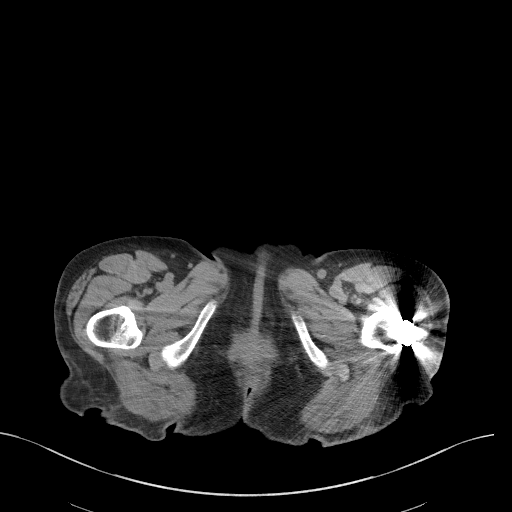
[im 6/97  bone]
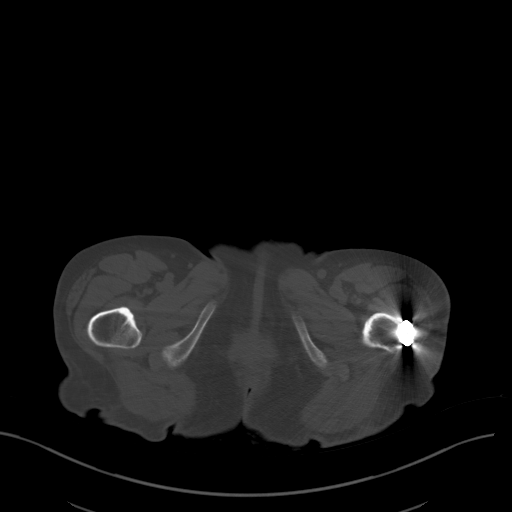
[im 17/97  soft-tissue]
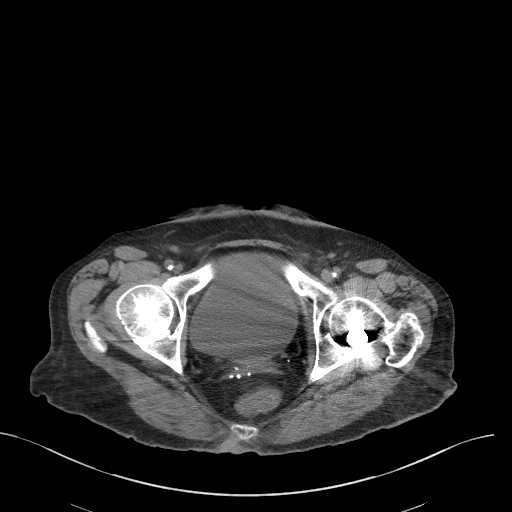
[im 23/97  soft-tissue]
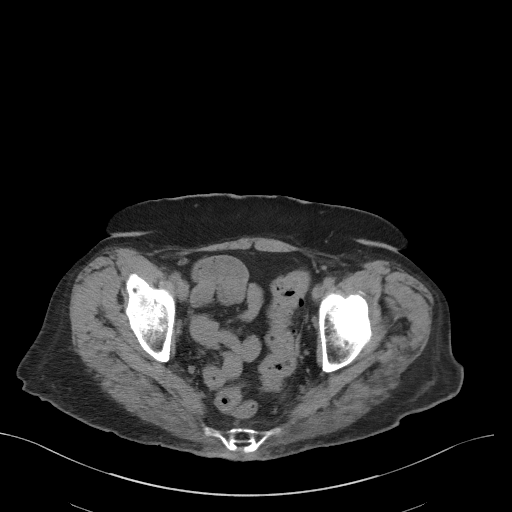
[im 29/97  soft-tissue]
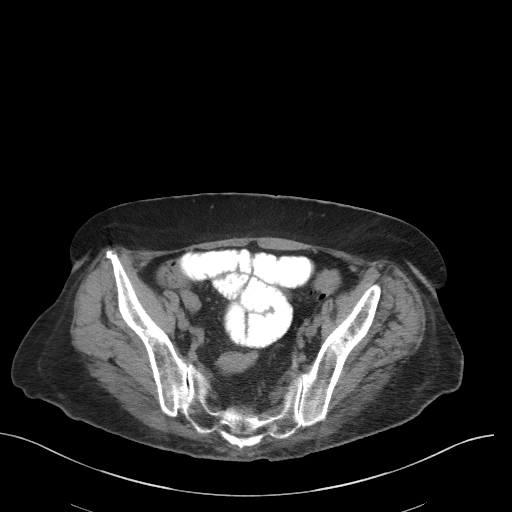
[im 40/97  soft-tissue]
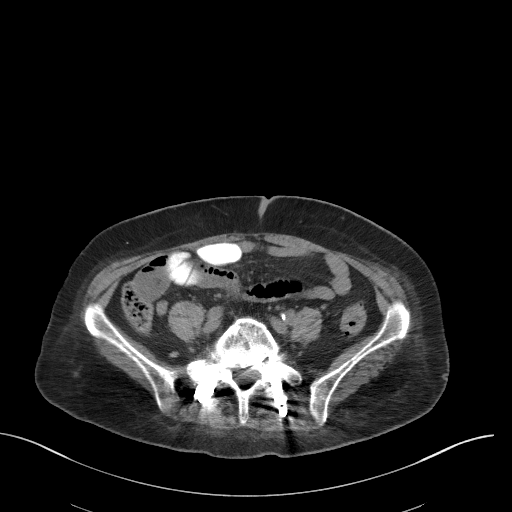
[im 46/97  soft-tissue]
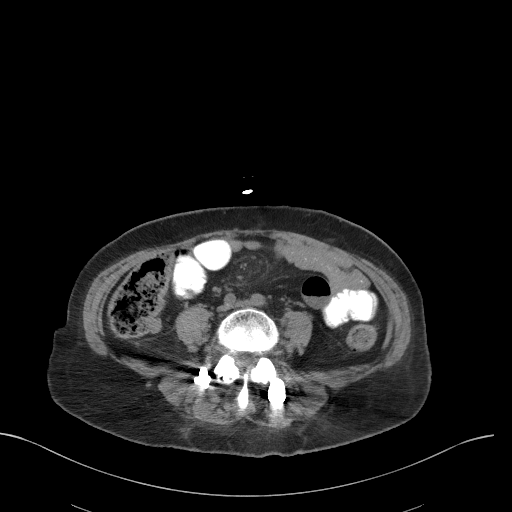
[im 51/97  soft-tissue]
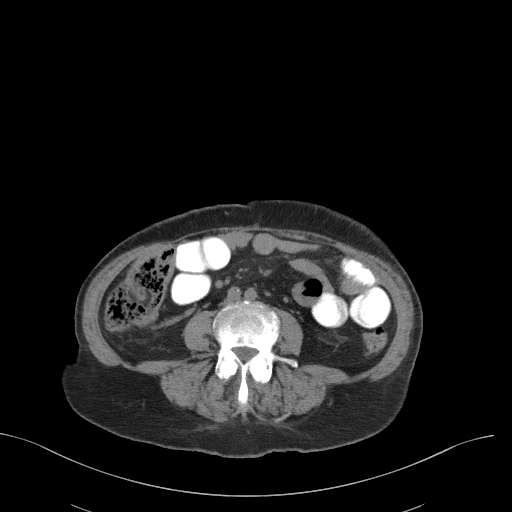
[im 63/97  soft-tissue]
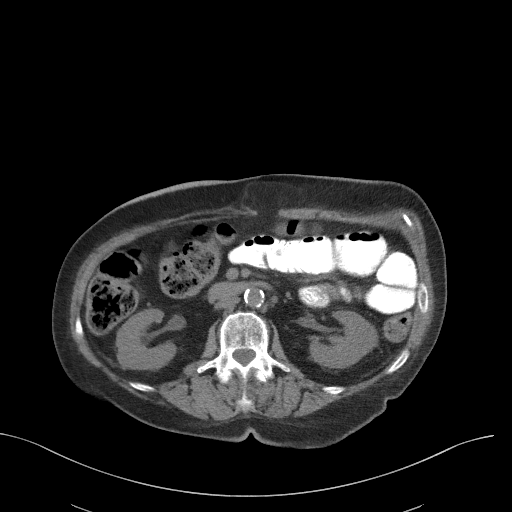
[im 68/97  soft-tissue]
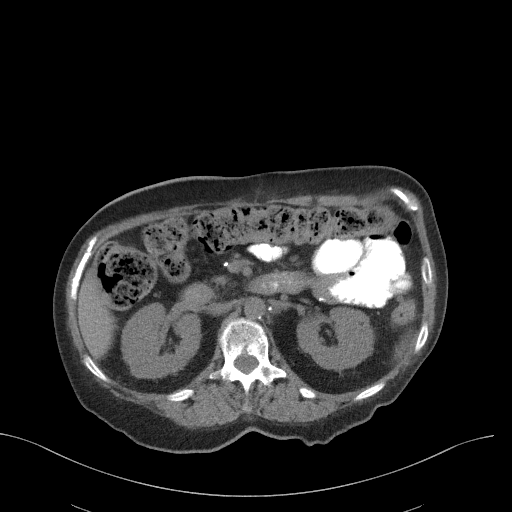
[im 68/97  bone]
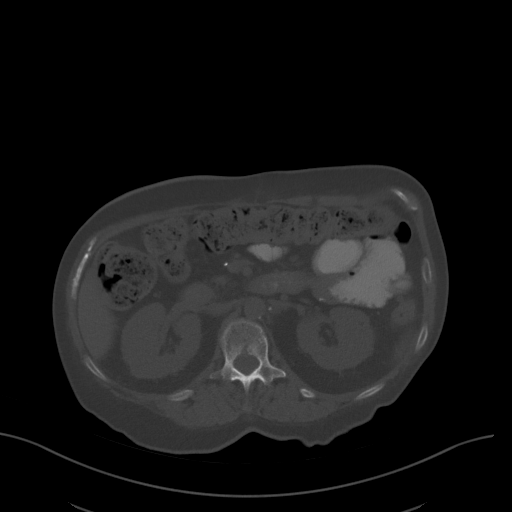
[im 74/97  soft-tissue]
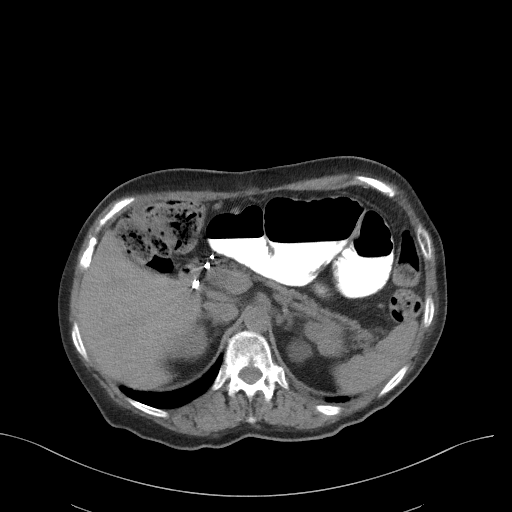
[im 85/97  soft-tissue]
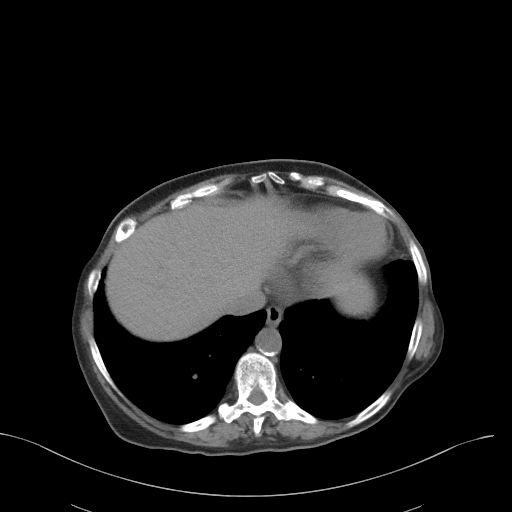
[im 91/97  soft-tissue]
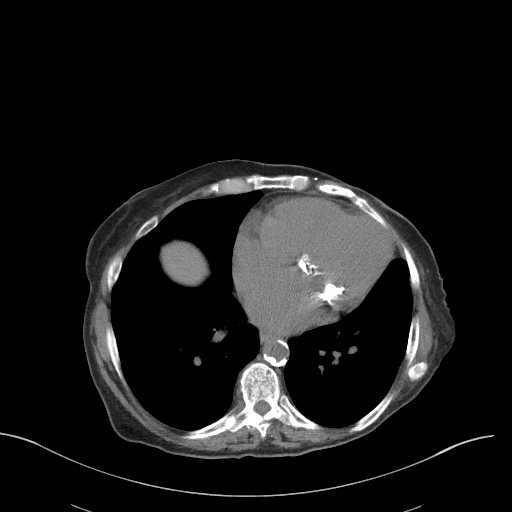

[Series 5: coronal st · coronal · 0.79mm/px · 3 of 83 slices shown]
[im 28/83  soft-tissue]
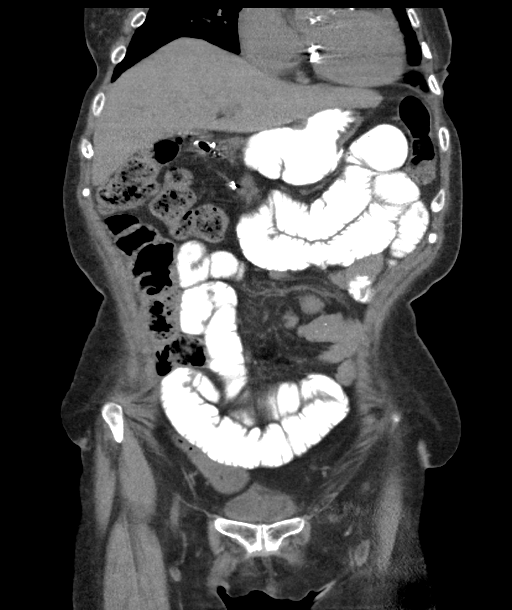
[im 37/83  soft-tissue]
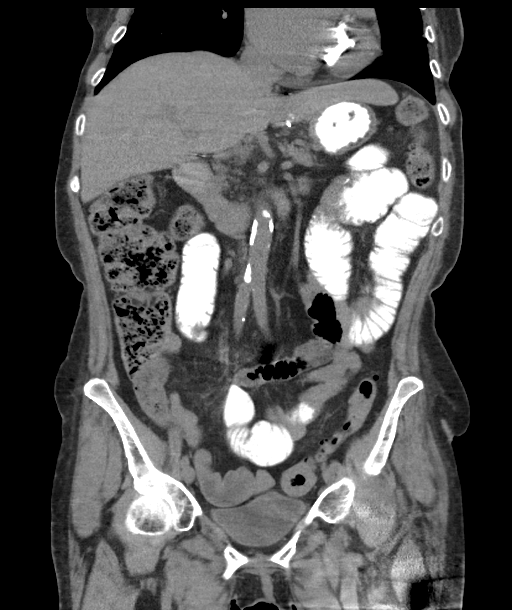
[im 46/83  soft-tissue]
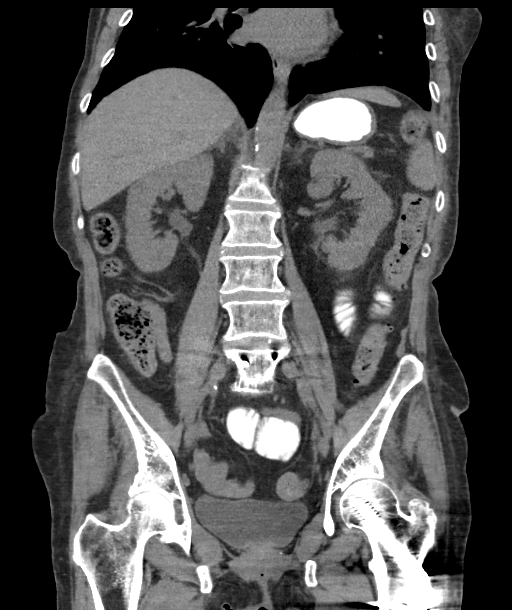

[15 of 46 positions shown; findings below may reference images not displayed]

FINDINGS: Lower chest: Extensive mitral annular calcifications. Pleural and
parenchymal scarring, no effusion or consolidation at the lung
bases.

Hepatobiliary: Liver grossly normal on noncontrast imaging. Post
cholecystectomy. No gross biliary duct distension.

Pancreas: Pancreatic atrophy.  No peripancreatic stranding.

Spleen: Spleen normal size and contour.

w

Adrenals/Urinary Tract: Adrenal glands are normal.

6 mm interpolar calculus associated with the RIGHT kidney. No
ureteral calculi. Smooth contour of the urinary bladder. Renal
cortical scarring with small angiomyolipoma in the LEFT kidney which
are unchanged and small cyst arising from the upper pole that is
also unchanged. Largest angiomyolipoma in the LEFT kidney is
medially in the upper pole approximately 14 mm.

Stomach/Bowel: Colonic diverticulosis. No acute colonic process.
Appendix not visualized but no secondary signs to suggest acute
appendicitis.

Signs of Billroth 2 with mild gastric distension and marked
distension of the jejunum near the anastomotic site up to 4.1 cm
with gradual transition occurring tracking along the jejunum beyond
the jejuno jejunal anastomosis. Transition in the LEFT upper
quadrant on image 44 of series 2 but oral contrast seen beyond this
level in the distal jejunum and proximal ileum in the pelvis and
some scattered areas of oral contrast in collapsed bowel in between.
The terminal ileum is collapsed. There is a segment of mildly
dilated small bowel in the RIGHT hemiabdomen which may be just
distal to mildly dilated small bowel in the mid abdomen that
contains ingested contrast material.

No perienteric stranding.  No pneumatosis.

Vascular/Lymphatic: Calcified atheromatous plaque in the
nonaneurysmal abdominal aorta. Flattened IVC. There is no
gastrohepatic or hepatoduodenal ligament lymphadenopathy. No
retroperitoneal or mesenteric lymphadenopathy.

Following mesenteric vasculature in the small bowel mesentery shows
no overt signs of mesenteric twist or significant mesenteric
distortion. Limited assessment of vascular structures in the abdomen
due to lack of intravenous contrast.

Reproductive: Post hysterectomy.  No pelvic adenopathy.

Other: No ascites.  No free air.

Musculoskeletal: Small fat containing abdominal wall hernia
unchanged in the mid abdomen just LEFT of midline. Post ORIF of the
LEFT proximal femur. Osteopenia. Signs of spinal fusion at L5-S1
with similar appearance.
IMPRESSION: Marked distension of the small bowel at the level of the gastro
jejunal anastomosis with transition beyond the level of the
jejunal-jejunal anastomosis in the LEFT hemiabdomen. Findings may
reflect partial small bowel obstruction and shown to have similar
appearance on prior small-bowel evaluations, therefore chronic but
with potential slight increase in proximal bowel distension over
prior studies.

Contrast seen distal to this within the more distal jejunum with
decompression of the ileum but with scattered areas of contrast in
between segments of dilated bowel. Significance uncertain [REDACTED] be
due to again to partial small bowel obstruction with some chronicity
due to adhesions. Small-bowel follow-through could potentially of
some benefit for further assessment MRI enterography could also be
considered as warranted for further assessment. No perienteric
stranding or mesenteric distortion.

Nephrolithiasis.

Aortic atherosclerosis.

## 2022-09-20 ENCOUNTER — Other Ambulatory Visit: Payer: Self-pay | Admitting: Cardiology

## 2022-09-20 DIAGNOSIS — I10 Essential (primary) hypertension: Secondary | ICD-10-CM

## 2022-10-05 DIAGNOSIS — M810 Age-related osteoporosis without current pathological fracture: Secondary | ICD-10-CM | POA: Diagnosis not present

## 2022-10-05 DIAGNOSIS — M0579 Rheumatoid arthritis with rheumatoid factor of multiple sites without organ or systems involvement: Secondary | ICD-10-CM | POA: Diagnosis not present

## 2022-10-06 DIAGNOSIS — M0579 Rheumatoid arthritis with rheumatoid factor of multiple sites without organ or systems involvement: Secondary | ICD-10-CM | POA: Diagnosis not present

## 2022-10-22 ENCOUNTER — Other Ambulatory Visit: Payer: Self-pay | Admitting: Cardiology

## 2022-10-22 DIAGNOSIS — I480 Paroxysmal atrial fibrillation: Secondary | ICD-10-CM

## 2022-10-28 DIAGNOSIS — G894 Chronic pain syndrome: Secondary | ICD-10-CM | POA: Diagnosis not present

## 2022-10-28 DIAGNOSIS — E1169 Type 2 diabetes mellitus with other specified complication: Secondary | ICD-10-CM | POA: Diagnosis not present

## 2022-10-28 DIAGNOSIS — E039 Hypothyroidism, unspecified: Secondary | ICD-10-CM | POA: Diagnosis not present

## 2022-10-28 DIAGNOSIS — M542 Cervicalgia: Secondary | ICD-10-CM | POA: Diagnosis not present

## 2022-10-28 DIAGNOSIS — F324 Major depressive disorder, single episode, in partial remission: Secondary | ICD-10-CM | POA: Diagnosis not present

## 2022-10-28 DIAGNOSIS — Z853 Personal history of malignant neoplasm of breast: Secondary | ICD-10-CM | POA: Diagnosis not present

## 2022-10-28 DIAGNOSIS — I10 Essential (primary) hypertension: Secondary | ICD-10-CM | POA: Diagnosis not present

## 2022-10-28 DIAGNOSIS — I480 Paroxysmal atrial fibrillation: Secondary | ICD-10-CM | POA: Diagnosis not present

## 2022-10-28 DIAGNOSIS — M069 Rheumatoid arthritis, unspecified: Secondary | ICD-10-CM | POA: Diagnosis not present

## 2022-10-28 DIAGNOSIS — E7800 Pure hypercholesterolemia, unspecified: Secondary | ICD-10-CM | POA: Diagnosis not present

## 2022-11-07 ENCOUNTER — Other Ambulatory Visit: Payer: Self-pay

## 2022-11-07 DIAGNOSIS — I480 Paroxysmal atrial fibrillation: Secondary | ICD-10-CM

## 2022-11-07 MED ORDER — DILTIAZEM HCL ER BEADS 300 MG PO CP24: 300.0000 mg | ORAL_CAPSULE | Freq: Every day | ORAL | 3 refills | Status: AC

## 2022-12-06 ENCOUNTER — Ambulatory Visit: Payer: Medicare Other | Admitting: Cardiology

## 2022-12-08 ENCOUNTER — Ambulatory Visit: Payer: Medicare Other | Admitting: Cardiology

## 2022-12-21 ENCOUNTER — Other Ambulatory Visit: Payer: Self-pay | Admitting: Cardiology

## 2022-12-21 DIAGNOSIS — I10 Essential (primary) hypertension: Secondary | ICD-10-CM

## 2022-12-22 ENCOUNTER — Other Ambulatory Visit: Payer: Self-pay | Admitting: Cardiology

## 2022-12-23 ENCOUNTER — Other Ambulatory Visit: Payer: Self-pay | Admitting: Cardiology

## 2022-12-23 DIAGNOSIS — I10 Essential (primary) hypertension: Secondary | ICD-10-CM

## 2022-12-28 DIAGNOSIS — H35373 Puckering of macula, bilateral: Secondary | ICD-10-CM | POA: Diagnosis not present

## 2022-12-28 DIAGNOSIS — H2513 Age-related nuclear cataract, bilateral: Secondary | ICD-10-CM | POA: Diagnosis not present

## 2022-12-28 DIAGNOSIS — H5203 Hypermetropia, bilateral: Secondary | ICD-10-CM | POA: Diagnosis not present

## 2023-01-04 NOTE — Telephone Encounter (Signed)
This encounter was created in error - please disregard.

## 2023-01-05 ENCOUNTER — Ambulatory Visit: Payer: Medicare Other | Admitting: Cardiology

## 2023-01-05 ENCOUNTER — Encounter: Payer: Self-pay | Admitting: Cardiology

## 2023-01-05 VITALS — BP 122/60 | HR 84 | Resp 16 | Ht 63.0 in | Wt 113.0 lb

## 2023-01-05 DIAGNOSIS — Z7901 Long term (current) use of anticoagulants: Secondary | ICD-10-CM | POA: Diagnosis not present

## 2023-01-05 DIAGNOSIS — I350 Nonrheumatic aortic (valve) stenosis: Secondary | ICD-10-CM | POA: Diagnosis not present

## 2023-01-05 DIAGNOSIS — I10 Essential (primary) hypertension: Secondary | ICD-10-CM | POA: Diagnosis not present

## 2023-01-05 DIAGNOSIS — I700 Atherosclerosis of aorta: Secondary | ICD-10-CM

## 2023-01-05 DIAGNOSIS — E119 Type 2 diabetes mellitus without complications: Secondary | ICD-10-CM

## 2023-01-05 DIAGNOSIS — I480 Paroxysmal atrial fibrillation: Secondary | ICD-10-CM

## 2023-01-05 DIAGNOSIS — I080 Rheumatic disorders of both mitral and aortic valves: Secondary | ICD-10-CM | POA: Diagnosis not present

## 2023-01-05 DIAGNOSIS — Z5181 Encounter for therapeutic drug level monitoring: Secondary | ICD-10-CM | POA: Diagnosis not present

## 2023-01-05 LAB — POCT INR: INR: 2.6 (ref 2.0–3.0)

## 2023-01-05 NOTE — Progress Notes (Signed)
ID:  [name], DOB Mar 02, 1951, MRN [id number]  PCP:  Noberto Retort, MD  Cardiologist:  Tessa Lerner, DO, [facility] (established care 06/28/21)  Date: 01/05/23 Last Office Visit: 09/30/2021  Chief Complaint  Patient presents with   Atrial Fibrillation   Follow-up   Medication Refill    HPI  [name] is a 72 y.o. Caucasian female whose past medical history and cardiovascular risk factors include: Hx of breast cancer twice (index event lumpectomy/chemo/radiation, second episode left mastectomy), HTN, Atrial fibrillation, mitral stenosis/regurgitation, aortic stenosis hypothyroidism, NIDDM Type II, RA, post menopausal, advance age, former smoker, aortic atherosclerosis.   In 2023 patient was diagnosed with atrial fibrillation and was started on medical therapy.  However due to symptomatic A-fib she did undergo direct-current cardioversion.  Follow-up echo and stress test were performed due to new diagnosis of A-fib.  Echocardiogram noted mitral stenosis as well as mitral regurgitation and therefore DOACs were transitioned to Coumadin.  She has not had recent INR checks. She presents today for follow-up.  Clinically denies anginal chest pain.  Does notice shortness of breath with effort related activities but overall intensity frequency and duration has not changed since last office encounter.  Patient's INR's were initially checked by the office but later the assumption that PCP has had been checking them. However, they were not checked as regularly as needed either by our practice or PCP.   Patient understands the importance of INR monitoring to make sure she has adequate amount of anticoagulation.  FUNCTIONAL STATUS: No structured exercise program or daily routine.   ALLERGIES: Allergies  Allergen Reactions   Fish Allergy Anaphylaxis and Hives   Amaryl [Glimepiride] Itching   Avalide [Irbesartan-Hydrochlorothiazide] Itching   Avandia [Rosiglitazone] Itching   Byetta  10 Mcg Pen [Exenatide] Itching   Codeine Itching    Other reaction(s): Unknown   Demerol [Meperidine Hcl] Itching   Glucovance [Glyburide-Metformin] Itching   Iodinated Contrast Media Itching    Other reaction(s):itching   Lisinopril Itching   Naprosyn [Naproxen] Hives   Penicillins Itching and Other (See Comments)    Has patient had a PCN reaction causing immediate rash, facial/tongue/throat swelling, SOB or lightheadedness with hypotension: no Has patient had a PCN reaction causing severe rash involving mucus membranes or skin necrosis: no Has patient had a PCN reaction that required hospitalization: yes Has patient had a PCN reaction occurring within the last 10 years: no If all of the above answers are "NO", then may proceed with Cephalosporin use.    MEDICATION LIST PRIOR TO VISIT: Current Meds  Medication Sig   abatacept (ORENCIA) 250 MG injection Inject 250 mg into the vein every 30 (thirty) days.   buPROPion (WELLBUTRIN XL) 150 MG 24 hr tablet Take 150 mg by mouth every evening.   denosumab (PROLIA) 60 MG/ML SOSY injection Inject 60 mg into the skin every 6 (six) months.   diltiazem (TIADYLT ER) 300 MG 24 hr capsule Take 1 capsule (300 mg total) by mouth daily.   escitalopram (LEXAPRO) 10 MG tablet Take 10 mg by mouth every evening.   esomeprazole (NEXIUM) 20 MG capsule Take 40 mg by mouth every evening.   folic acid (FOLVITE) 1 MG tablet Take 2 mg by mouth every evening.   hydrochlorothiazide (HYDRODIURIL) 25 MG tablet TAKE 1 TABLET BY MOUTH EVERY DAY IN THE MORNING   HYDROcodone-acetaminophen (NORCO) 10-325 MG tablet Take 1 tablet by mouth every 6 (six) hours as needed for severe pain (post-operatively).   hydrocortisone (ANUSOL-HC)  25 MG suppository Place 25 mg rectally 2 (two) times daily as needed for hemorrhoids.    hydroxychloroquine (PLAQUENIL) 200 MG tablet Take 200 mg by mouth every evening.   levothyroxine (SYNTHROID) 175 MCG tablet Take 175 mcg by mouth every  evening.   losartan (COZAAR) 50 MG tablet TAKE 1 TABLET BY MOUTH EVERY DAY   metFORMIN (GLUCOPHAGE-XR) 500 MG 24 hr tablet Take 2,000 mg by mouth every evening.   morphine (MS CONTIN) 30 MG 12 hr tablet Take 30 mg by mouth every 12 (twelve) hours. 300 am and 300 pm   Multiple Vitamin (MULTIVITAMIN WITH MINERALS) TABS tablet Take 1 tablet by mouth every evening.   pravastatin (PRAVACHOL) 40 MG tablet Take 40 mg by mouth every evening.   warfarin (COUMADIN) 5 MG tablet TAKE 1 TABLET (5 MG TOTAL) BY MOUTH DAILY. (Patient taking differently: Take 2.5 mg by mouth daily.)     PAST MEDICAL HISTORY: Past Medical History:  Diagnosis Date   Asthma, mild    Intermittent   Atrial fibrillation [facility])    Feb 2023   Cancer [facility])    Chronic back pain    Echocardiogram abnormal 09/2008   With mild aortic valve and mitral valve regurgitation   Heart murmur    History of left breast cancer 1999   Hyperlipidemia    Hypertension    Hypothyroidism    IBS (irritable bowel syndrome)    Lactose intolerance    Osteoporosis    Peptic ulcer disease    rheumatoid Arthritis    Type II diabetes mellitus (HCC)    [facility] 08-2012 checks cbg bid   Ulcer, duodenal, acute, with obstruction 1999   Wears glasses     PAST SURGICAL HISTORY: Past Surgical History:  Procedure Laterality Date   ABDOMINAL HYSTERECTOMY     BREAST IMPLANT REMOVAL     Breast implant infected removed implant 06/06 infected and removed 11/05   BREAST LUMPECTOMY Left 12/1997   Left breast   BREAST RECONSTRUCTION     CARDIOVERSION N/A 08/11/2021   Procedure: CARDIOVERSION;  Surgeon: Tessa Lerner, DO;  Location: [facility];  Service: Cardiovascular;  Laterality: N/A;   CHOLECYSTECTOMY     CYSTOSCOPY W/ URETERAL STENT PLACEMENT Left 01/19/2018   Procedure: CYSTOSCOPY WITH RETROGRADE PYELOGRAM/URETERAL STENT PLACEMENT;  Surgeon: Marcine Matar, MD;  Location: [facility];  Service: Urology;  Laterality: Left;   CYSTOSCOPY W/ URETERAL STENT  PLACEMENT Right 10/27/2021   Procedure: CYSTOSCOPY WITH RETROGRADE PYELOGRAM/URETERAL STENT PLACEMENT;  Surgeon: Sebastian Ache, MD;  Location: [facility] OR;  Service: Urology;  Laterality: Right;   CYSTOSCOPY WITH RETROGRADE PYELOGRAM, URETEROSCOPY AND STENT PLACEMENT Right 11/19/2021   Procedure: CYSTOSCOPY WITH RETROGRADE PYELOGRAM, URETEROSCOPY AND STENT EXCHANGE;  Surgeon: Sebastian Ache, MD;  Location: [facility];  Service: Urology;  Laterality: Right;  1 HR   CYSTOSCOPY WITH STENT PLACEMENT  10/27/2021   cone   EXTRACORPOREAL SHOCK WAVE LITHOTRIPSY Left 02/15/2018   Procedure: LEFT EXTRACORPOREAL SHOCK WAVE LITHOTRIPSY (ESWL);  Surgeon: Malen Gauze, MD;  Location: [facility];  Service: Urology;  Laterality: Left;   HOLMIUM LASER APPLICATION Right 11/19/2021   Procedure: HOLMIUM LASER APPLICATION;  Surgeon: Sebastian Ache, MD;  Location: [facility];  Service: Urology;  Laterality: Right;   MASTECTOMY  07/2003   Left   other     Left Arm FX   OTHER SURGICAL HISTORY     Hysterectomy- Ovaries intact    OTHER SURGICAL HISTORY     Fracture Left HIp pinning sx done  PARTIAL GASTRECTOMY  1999   due to ulcers, earlier sx done on stomach also    FAMILY HISTORY: The patient family history includes Diabetes in her brother, father, and sister; Diabetes Mellitus II in her father; Heart attack in her father; Heart failure in her mother; Hypertension in her father; Osteoporosis in her mother; Thyroid cancer in her sister.  SOCIAL HISTORY:  The patient  reports that she quit smoking about 22 years ago. Her smoking use included cigarettes. She started smoking about 27 years ago. She has a 5 pack-year smoking history. She has never used smokeless tobacco. She reports that she does not drink alcohol and does not use drugs.  REVIEW OF SYSTEMS: Review of Systems  Cardiovascular:  Positive for dyspnea on exertion (intermittent). Negative for chest pain, claudication, irregular heartbeat, leg swelling,  near-syncope, orthopnea, palpitations, paroxysmal nocturnal dyspnea and syncope.  Hematologic/Lymphatic: Negative for bleeding problem.  Musculoskeletal:  Negative for muscle cramps and myalgias.  Neurological:  Negative for dizziness and light-headedness.    PHYSICAL EXAM:    01/05/2023    2:54 PM 11/19/2021    1:45 PM 11/19/2021    1:30 PM  Vitals with BMI  Height 5\' 3"     Weight 113 lbs    BMI 20.02    Systolic 122 148 295  Diastolic 60 74 76  Pulse 84 95 97    Physical Exam  Constitutional: No distress.  Age appropriate, hemodynamically stable.   Neck: No JVD present.  Cardiovascular: Normal rate, regular rhythm, S1 normal, S2 normal, intact distal pulses and normal pulses. Exam reveals no gallop, no S3 and no S4.  Murmur heard. Systolic murmur is present with a grade of 3/6 at the upper right sternal border. Holosystolic  murmur of grade 3/6 is also present at the apex. Crescendo diastolic murmur is present with a grade of 3/4 at the apex. Pulmonary/Chest: Effort normal and breath sounds normal. No stridor. She has no wheezes. She has no rales.  Left mastectomy.  Abdominal: Soft. Bowel sounds are normal. She exhibits no distension. There is no abdominal tenderness.  Musculoskeletal:        General: Edema (Trace bilateral) present.     Cervical back: Neck supple.  Neurological: She is alert and oriented to person, place, and time. She has intact cranial nerves (2-12).  Skin: Skin is warm and moist.   CARDIAC DATABASE: Direct-current cardioversion: 08/11/2021 150 J x 1 converted to normal sinus rhythm.  EKG: 06/28/2021: Atrial fibrillation, 107pm, nonspecific ST-T changes, without underlying injury pattern.  August 23, 2021: Normal sinus rhythm, 84 bpm, LVH per voltage criteria, without underlying ischemia or injury pattern. 01/05/2023: Ectopic atrial rhythm, 83 bpm, nonspecific ST-T changes.  Echocardiogram: 08/25/2021:  Normal LV systolic function with visual EF  55-60%. Left ventricle cavity is small in size. Mild basal asymmetric hypertrophy of the left ventricle. Mild concentric LVH. Normal global wall motion. Mild flattening of the septum may be due to bundle branch block vs RV pressure overload. Indeterminate diastolic filling pattern due to mitral apparatus calcification, elevated LAP.  Left atrial cavity is severely dilated at 4.9 cm. Mild calcification of the aortic valve annulus. Mildly restricted aortic valve leaflets. Trileaflet aortic valve. Mild aortic stenosis. Moderate (Grade II) aortic regurgitation. Peak velocity 2.53 m/s, Peak Pressure Gradient 25.6 mmHg, Mean Gradient 15.6 mmHg, AVA 1.0 cm, Dimensionless Index 0.38. Severe calcification of the mitral valve annulus. Mild mitral valve leaflet calcification. Moderately restricted mitral valve leaflets. Moderate mitral stenosis. Severe (Grade III) mitral  regurgitation. Mitral valve peak pressure gradient 26.6, mean pressure gradient 13.8 mmHg, calculated mitral valve area by pressure half-time is 1.3 cm. Marked elevation in the velocity is due to severe MR and flow. Moderate tricuspid regurgitation. Moderate pulmonary hypertension. RVSP measures 44 mmHg. Pericardium is normal. Insignificant pericardial effusion. IVC is normal with poor inspiration collapse consistent with elevated right atrial pressure (8mm Hg).    Stress Testing: Lexiscan Tetrofosmin stress test 08/31/2021: 1 Day Rest/Stress Protocol. Stress ECG negative for ischemia. Small size, mild intensity, reversible perfusion defect in apical lateral segment suggestive of possible ischemia in the distal LCx distribution. No convincing evidence of prior infarct. Left ventricular wall thickness and size preserved. Calculated LVEF 66%, visually appears hyperdynamic. No prior studies available for comparison. Low risk study.  Heart Catheterization: None  LABORATORY DATA:    Latest Ref Rng & Units 10/30/2021    3:00 AM 10/29/2021     2:59 AM 10/28/2021    5:45 AM  CBC  WBC 4.0 - 10.5 K/uL 29.4  30.8  40.6   Hemoglobin 12.0 - 15.0 g/dL 86.5  9.1  9.2   Hematocrit 36.0 - 46.0 % 33.8  28.5  28.4   Platelets 150 - 400 K/uL 463  298  334        Latest Ref Rng & Units 10/30/2021    3:00 AM 10/29/2021    2:59 AM 10/28/2021    5:45 AM  CMP  Glucose 70 - 99 mg/dL 784  696  295   BUN 8 - 23 mg/dL 17  22  21    Creatinine 0.44 - 1.00 mg/dL 2.84  1.32  4.40   Sodium 135 - 145 mmol/L 135  128  138   Potassium 3.5 - 5.1 mmol/L 4.1  5.4  3.8   Chloride 98 - 111 mmol/L 94  90  95   CO2 22 - 32 mmol/L 29  26  31    Calcium 8.9 - 10.3 mg/dL 8.4  8.1  8.2   Total Protein 6.5 - 8.1 g/dL 6.0     Total Bilirubin 0.3 - 1.2 mg/dL 0.8     Alkaline Phos 38 - 126 U/L 120     AST 15 - 41 U/L 83     ALT 0 - 44 U/L 71       Lipid Panel  No results found for: "CHOL", "TRIG", "HDL", "CHOLHDL", "VLDL", "LDLCALC", "LDLDIRECT", "LABVLDL"  No components found for: "NTPROBNP" No results for input(s): "PROBNP" in the last 8760 hours.  No results for input(s): "TSH" in the last 8760 hours.  BMP No results for input(s): "NA", "K", "CL", "CO2", "GLUCOSE", "BUN", "CREATININE", "CALCIUM", "GFRNONAA", "GFRAA" in the last 8760 hours.   HEMOGLOBIN A1C Lab Results  Component Value Date   HGBA1C 7.6 (H) 10/27/2021   MPG 171 10/27/2021    External Labs: Collected: 03/12/2021 provided by referring physician. TSH 0.11 (below normal limits). Free T4   1.27 (above normal limits.) Total cholesterol 114, triglycerides 110, HDL 67, LDL 27 Hemoglobin N0U 6.8.  External Labs: Collected: 06/21/2021 provided by referring physician. TSH 0.87 (within normal limits)  External Labs: Collected: May 31, 2022: Provided by the patient. Hemoglobin 11.7 g/dL  Collected: August 22, 2022 provided by the patient. BUN 16, creatinine 0.64. Sodium 134, potassium 4.3, chloride 94, bicarb 20. AST 18, ALT 15, alkaline phosphatase 68 Hemoglobin 10.8, hematocrit  34.1%   IMPRESSION:    ICD-10-CM   1. Paroxysmal atrial fibrillation (HCC)  I48.0 EKG 12-Lead  POCT INR    2. Long term (current) use of anticoagulants  Z79.01 CBC    Basic metabolic panel    3. Benign hypertension  I10     4. Non-insulin dependent type 2 diabetes mellitus (HCC)  E11.9     5. Atherosclerosis of aorta (HCC)  I70.0     6. Mitral stenosis and aortic regurgitation  I08.0 ECHOCARDIOGRAM COMPLETE    7. Nonrheumatic aortic valve stenosis  I35.0 ECHOCARDIOGRAM COMPLETE        RECOMMENDATIONS: SKYLEA MEINE is a 72 y.o. Caucasian female whose past medical history and cardiac risk factors include: Hx of breast cancer twice (index event lumpectomy/chemo/radiation, second episode left mastectomy), HTN, Atrial fibrillation, hypothyroidism, NIDDM Type II, RA, post menopausal, advance age, former smoker, aortic atherosclerosis.   Paroxysmal valvular atrial fibrillation (HCC) Rate control: Diltiazem. Rhythm control N/A. Thromboembolic prophylaxis Coumadin Discovered in February 2023. Click Here to Calculate/Change CHADS2VASc Score The patient's CHADS2-VASc score is 5, indicating a 7.2% annual risk of stroke.   CHF History: No HTN History: Yes Diabetes History: Yes Stroke History: No Vascular Disease History: Yes Status post direct-current cardioversion August 11, 2021 to normal sinus rhythm at 150 J x 1. EKG today illustrates ectopic atrial rhythm-will monitor for now.  Long term (current) use of anticoagulants Indication: Paroxysmal atrial fibrillation. Currently on Coumadin 2.5 mg p.o. daily. INR today is 2.6 Patient is aware that she will need INR checks as directed by Coumadin clinic even if he remains stable she needs to be seen every 4 weeks.  Will arrange INR check in 4 weeks  Benign hypertension Office blood pressures are not well controlled. Continue current medical therapy.  No changes warranted. Outside labs independently reviewed and noted  above  Mitral stenosis and mitral regurgitation Nonrheumatic aortic valve stenosis Overall symptoms remain stable-dyspnea with over exertional activities. Denies near-syncope or syncopal events. Echo will be ordered to evaluate for structural heart disease and left ventricular systolic function.   FINAL MEDICATION LIST END OF ENCOUNTER: No orders of the defined types were placed in this encounter.   Medications Discontinued During This Encounter  Medication Reason   TIADYLT ER 360 MG 24 hr capsule       Current Outpatient Medications:    abatacept (ORENCIA) 250 MG injection, Inject 250 mg into the vein every 30 (thirty) days., Disp: , Rfl:    buPROPion (WELLBUTRIN XL) 150 MG 24 hr tablet, Take 150 mg by mouth every evening., Disp: , Rfl:    denosumab (PROLIA) 60 MG/ML SOSY injection, Inject 60 mg into the skin every 6 (six) months., Disp: , Rfl:    diltiazem (TIADYLT ER) 300 MG 24 hr capsule, Take 1 capsule (300 mg total) by mouth daily., Disp: 90 capsule, Rfl: 3   escitalopram (LEXAPRO) 10 MG tablet, Take 10 mg by mouth every evening., Disp: , Rfl:    esomeprazole (NEXIUM) 20 MG capsule, Take 40 mg by mouth every evening., Disp: , Rfl:    folic acid (FOLVITE) 1 MG tablet, Take 2 mg by mouth every evening., Disp: , Rfl:    hydrochlorothiazide (HYDRODIURIL) 25 MG tablet, TAKE 1 TABLET BY MOUTH EVERY DAY IN THE MORNING, Disp: 90 tablet, Rfl: 1   HYDROcodone-acetaminophen (NORCO) 10-325 MG tablet, Take 1 tablet by mouth every 6 (six) hours as needed for severe pain (post-operatively)., Disp: 20 tablet, Rfl: 0   hydrocortisone (ANUSOL-HC) 25 MG suppository, Place 25 mg rectally 2 (two) times daily as needed for hemorrhoids. , Disp: , Rfl:  hydroxychloroquine (PLAQUENIL) 200 MG tablet, Take 200 mg by mouth every evening., Disp: , Rfl:    levothyroxine (SYNTHROID) 175 MCG tablet, Take 175 mcg by mouth every evening., Disp: , Rfl:    losartan (COZAAR) 50 MG tablet, TAKE 1 TABLET BY MOUTH  EVERY DAY, Disp: 90 tablet, Rfl: 1   metFORMIN (GLUCOPHAGE-XR) 500 MG 24 hr tablet, Take 2,000 mg by mouth every evening., Disp: , Rfl:    morphine (MS CONTIN) 30 MG 12 hr tablet, Take 30 mg by mouth every 12 (twelve) hours. 300 am and 300 pm, Disp: , Rfl:    Multiple Vitamin (MULTIVITAMIN WITH MINERALS) TABS tablet, Take 1 tablet by mouth every evening., Disp: , Rfl:    pravastatin (PRAVACHOL) 40 MG tablet, Take 40 mg by mouth every evening., Disp: , Rfl:    warfarin (COUMADIN) 5 MG tablet, TAKE 1 TABLET (5 MG TOTAL) BY MOUTH DAILY. (Patient taking differently: Take 2.5 mg by mouth daily.), Disp: 90 tablet, Rfl: 3  Orders Placed This Encounter  Procedures   CBC   Basic metabolic panel   POCT INR   EKG 12-Lead   ECHOCARDIOGRAM COMPLETE    There are no Patient Instructions on file for this visit.   --Continue cardiac medications as reconciled in final medication list. --Return in about 6 weeks (around 02/16/2023) for Follow up aortic and mitral valve disease, follow-up echo.. Or sooner if needed. --Continue follow-up with your primary care physician regarding the management of your other chronic comorbid conditions.  Patient's questions and concerns were addressed to her satisfaction. She voices understanding of the instructions provided during this encounter.   This note was created using a voice recognition software as a result there may be grammatical errors inadvertently enclosed that do not reflect the nature of this encounter. Every attempt is made to correct such errors.  Tessa Lerner, [place], [facility]  Pager: [phone] Office: [phone]

## 2023-01-06 DIAGNOSIS — H2513 Age-related nuclear cataract, bilateral: Secondary | ICD-10-CM | POA: Diagnosis not present

## 2023-01-06 DIAGNOSIS — H35033 Hypertensive retinopathy, bilateral: Secondary | ICD-10-CM | POA: Diagnosis not present

## 2023-01-06 DIAGNOSIS — H43823 Vitreomacular adhesion, bilateral: Secondary | ICD-10-CM | POA: Diagnosis not present

## 2023-01-06 LAB — BASIC METABOLIC PANEL
BUN/Creatinine Ratio: 26 (ref 12–28)
BUN: 20 mg/dL (ref 8–27)
CO2: 22 mmol/L (ref 20–29)
Calcium: 9.7 mg/dL (ref 8.7–10.3)
Chloride: 97 mmol/L (ref 96–106)
Creatinine, Ser: 0.76 mg/dL (ref 0.57–1.00)
Glucose: 185 mg/dL — ABNORMAL HIGH (ref 70–99)
Potassium: 4.8 mmol/L (ref 3.5–5.2)
Sodium: 139 mmol/L (ref 134–144)
eGFR: 83 mL/min/{1.73_m2} (ref >59)

## 2023-01-06 LAB — CBC
Hematocrit: 33.9 % — ABNORMAL LOW (ref 34.0–46.6)
Hemoglobin: 10.5 g/dL — ABNORMAL LOW (ref 11.1–15.9)
MCH: 26.4 pg — ABNORMAL LOW (ref 26.6–33.0)
MCHC: 31 g/dL — ABNORMAL LOW (ref 31.5–35.7)
MCV: 85 fL (ref 79–97)
Platelets: 408 10*3/uL (ref 150–450)
RBC: 3.97 x10E6/uL (ref 3.77–5.28)
RDW: 14.8 % (ref 11.7–15.4)
WBC: 10.4 10*3/uL (ref 3.4–10.8)

## 2023-01-09 NOTE — Progress Notes (Signed)
Called and spoke with patient regarding her recent lab results.

## 2023-01-30 ENCOUNTER — Encounter (HOSPITAL_COMMUNITY): Payer: Self-pay | Admitting: Cardiology

## 2023-01-30 ENCOUNTER — Ambulatory Visit (HOSPITAL_COMMUNITY): Payer: Medicare Other | Attending: Cardiology

## 2023-02-09 ENCOUNTER — Ambulatory Visit: Payer: Self-pay | Admitting: Cardiology

## 2023-02-09 DIAGNOSIS — E039 Hypothyroidism, unspecified: Secondary | ICD-10-CM | POA: Diagnosis not present

## 2023-02-09 DIAGNOSIS — R600 Localized edema: Secondary | ICD-10-CM | POA: Diagnosis not present

## 2023-02-09 DIAGNOSIS — N3000 Acute cystitis without hematuria: Secondary | ICD-10-CM | POA: Diagnosis not present

## 2023-02-09 DIAGNOSIS — E1169 Type 2 diabetes mellitus with other specified complication: Secondary | ICD-10-CM | POA: Diagnosis not present

## 2023-02-09 DIAGNOSIS — Z23 Encounter for immunization: Secondary | ICD-10-CM | POA: Diagnosis not present

## 2023-02-09 DIAGNOSIS — I10 Essential (primary) hypertension: Secondary | ICD-10-CM | POA: Diagnosis not present

## 2023-02-09 DIAGNOSIS — G894 Chronic pain syndrome: Secondary | ICD-10-CM | POA: Diagnosis not present

## 2023-02-09 DIAGNOSIS — Z853 Personal history of malignant neoplasm of breast: Secondary | ICD-10-CM | POA: Diagnosis not present

## 2023-02-09 DIAGNOSIS — F324 Major depressive disorder, single episode, in partial remission: Secondary | ICD-10-CM | POA: Diagnosis not present

## 2023-02-09 DIAGNOSIS — E7800 Pure hypercholesterolemia, unspecified: Secondary | ICD-10-CM | POA: Diagnosis not present

## 2023-02-09 DIAGNOSIS — I480 Paroxysmal atrial fibrillation: Secondary | ICD-10-CM | POA: Diagnosis not present

## 2023-02-27 ENCOUNTER — Telehealth: Payer: Self-pay | Admitting: *Deleted

## 2023-02-27 ENCOUNTER — Other Ambulatory Visit: Payer: Self-pay

## 2023-02-27 ENCOUNTER — Ambulatory Visit: Payer: Medicare Other | Attending: Cardiovascular Disease

## 2023-02-27 DIAGNOSIS — Z6820 Body mass index (BMI) 20.0-20.9, adult: Secondary | ICD-10-CM | POA: Diagnosis not present

## 2023-02-27 DIAGNOSIS — Z79899 Other long term (current) drug therapy: Secondary | ICD-10-CM | POA: Diagnosis not present

## 2023-02-27 DIAGNOSIS — R5383 Other fatigue: Secondary | ICD-10-CM | POA: Diagnosis not present

## 2023-02-27 DIAGNOSIS — I4891 Unspecified atrial fibrillation: Secondary | ICD-10-CM

## 2023-02-27 DIAGNOSIS — M0579 Rheumatoid arthritis with rheumatoid factor of multiple sites without organ or systems involvement: Secondary | ICD-10-CM | POA: Diagnosis not present

## 2023-02-27 DIAGNOSIS — M1991 Primary osteoarthritis, unspecified site: Secondary | ICD-10-CM | POA: Diagnosis not present

## 2023-02-27 DIAGNOSIS — M810 Age-related osteoporosis without current pathological fracture: Secondary | ICD-10-CM | POA: Diagnosis not present

## 2023-02-27 DIAGNOSIS — G894 Chronic pain syndrome: Secondary | ICD-10-CM | POA: Diagnosis not present

## 2023-02-27 NOTE — Telephone Encounter (Signed)
Called pt since she missed her new Anticoagulation Clinic appt today at 230pm. There was no answer so left her a message to call back regarding this.   Last OV 01/05/23 by Dr. Odis Hollingshead and per OV note it states: Long term (current) use of anticoagulants Indication: Paroxysmal atrial fibrillation. Currently on Coumadin 2.5 mg p.o. daily. INR today is 2.6 Patient is aware that she will need INR checks as directed by Coumadin clinic even if he remains stable she needs to be seen every 4 weeks.  Will arrange INR check in 4 weeks  Prior to Dr. Odis Hollingshead checking INR in office last INR was 10/19/21

## 2023-03-01 ENCOUNTER — Telehealth: Payer: Self-pay | Admitting: Cardiology

## 2023-03-01 DIAGNOSIS — I080 Rheumatic disorders of both mitral and aortic valves: Secondary | ICD-10-CM

## 2023-03-01 DIAGNOSIS — I4891 Unspecified atrial fibrillation: Secondary | ICD-10-CM

## 2023-03-01 DIAGNOSIS — I350 Nonrheumatic aortic (valve) stenosis: Secondary | ICD-10-CM

## 2023-03-01 NOTE — Telephone Encounter (Signed)
Pt c/o swelling/edema: STAT if pt has developed SOB within 24 hours  If swelling, where is the swelling located?   Both feet, but more in left foot  How much weight have you gained and in what time span?   Not sure  Have you gained 2 pounds in a day or 5 pounds in a week?  No  Do you have a log of your daily weights (if so, list)?   No  Are you currently taking a fluid pill?   Yes  Are you currently SOB?   Yes, a little  Have you traveled recently in a car or plane for an extended period of time?  No  Patient stated she does not think her fluid medication is strong enough as she has been having swelling in her feet during the day for the past week.  Patient stated the swelling goes down at night but comes back in the morning.

## 2023-03-01 NOTE — Telephone Encounter (Signed)
Spoke with Pt. States for the last week she has been having swelling in her feet and legs. Swelling is happening more at night. Says there is more swelling in left foot than right but not a huge increase in swelling nor pain or heat.States that Dr Tiburcio Pea increased her hydrochlorothiazide from 25 mg to 50 mg at last visit. Pt states she takes this medication at night. I told her it was probably a better idea to that this medication in the morning. She stated she is having some SOB with swelling at night that goes away once she puts her legs up. Patient states Dr Tiburcio Pea use to put her on something that started with an S (she denied in was Spirolactone when asked) that took the fluid right off of her. Told pt to keep elevating her legs, wear compression stockings and watch her salt intake and that I would forward to Dr Odis Hollingshead. Pt stated understanding.

## 2023-03-02 NOTE — Telephone Encounter (Signed)
Spoke with the patient and advised on recommendations from Dr. Odis Hollingshead. Echo has been ordered.

## 2023-03-02 NOTE — Telephone Encounter (Signed)
Agree.   Please get an echo before the next visit. She has valve disease want to make sure it has not worsened.   Javarion Douty [place], DO, [facility]

## 2023-03-02 NOTE — Addendum Note (Signed)
Addended by: Frutoso Schatz on: 03/02/2023 10:50 AM   Modules accepted: Orders

## 2023-03-07 ENCOUNTER — Ambulatory Visit: Payer: Medicare Other | Admitting: Cardiology

## 2023-03-13 DIAGNOSIS — H2513 Age-related nuclear cataract, bilateral: Secondary | ICD-10-CM | POA: Diagnosis not present

## 2023-03-13 DIAGNOSIS — H25013 Cortical age-related cataract, bilateral: Secondary | ICD-10-CM | POA: Diagnosis not present

## 2023-03-13 DIAGNOSIS — H43823 Vitreomacular adhesion, bilateral: Secondary | ICD-10-CM | POA: Diagnosis not present

## 2023-03-15 NOTE — Telephone Encounter (Signed)
Patient is calling back to say that she still has swelling still going on. Please advise

## 2023-03-15 NOTE — Telephone Encounter (Signed)
Patient returned call

## 2023-03-15 NOTE — Telephone Encounter (Signed)
Left voicemail to return call to office.

## 2023-03-15 NOTE — Telephone Encounter (Signed)
Spoke with patient and she states she tried all of your recommendations and her hydrochlorothiazide was increased to 50 mg daily by PCP and she has not seen an improvement. She has not gained weight. Denies chest pain, SOB, headache, nausea or vomiting. She would like to know what else do you recommend

## 2023-03-16 NOTE — Telephone Encounter (Signed)
She states the swelling is not resolved in the morning. She has swelling throughout the days and the night as well. She also states she wears compressions stockings, she has a wedge and elevate her legs, she also monitor her salt intake and none of this has helped her swelling. There has been no change in her swelling since her first call. She denies orthopnea. She does get SOB when lying down, but once she sit up its better.

## 2023-03-16 NOTE — Telephone Encounter (Signed)
If the swelling is improving by the time she wakes up she is likely secondary to chronic venous insufficiency for which recommend elevating the legs and compression stockings about 6 to 8 hours a day with reducing salt intake.  They have already increased her diuretics as noted above.  Further increasing her diuretics may also affect her renal function.  Will await the results of the echocardiogram?  Please have her keep a log of her blood pressures and weight on a daily basis.  Ask if she has any orthopnea or PND.  Please reconcile her medications including the medication names and dosages.  Cookie Pore [place], DO, [facility]

## 2023-03-17 MED ORDER — FUROSEMIDE 20 MG PO TABS: 20.0000 mg | ORAL_TABLET | Freq: Every day | ORAL | 0 refills | Status: DC

## 2023-03-17 NOTE — Addendum Note (Signed)
Addended by: Judene Companion on: 03/17/2023 04:32 PM   Modules accepted: Orders

## 2023-03-17 NOTE — Telephone Encounter (Signed)
Spoke with patient and she will start lasix today. BMET one week. She verbalized understanding

## 2023-03-17 NOTE — Telephone Encounter (Signed)
Send in a script for lasix 20mg  po qday. BMP in one week.   Brynden Thune [place], DO, [facility]

## 2023-03-21 ENCOUNTER — Ambulatory Visit (HOSPITAL_COMMUNITY): Payer: Medicare Other | Attending: Cardiology

## 2023-03-23 ENCOUNTER — Ambulatory Visit (HOSPITAL_COMMUNITY): Payer: Medicare Other | Attending: Cardiology

## 2023-03-23 ENCOUNTER — Encounter (HOSPITAL_COMMUNITY): Payer: Self-pay | Admitting: Cardiology

## 2023-04-05 ENCOUNTER — Inpatient Hospital Stay (HOSPITAL_COMMUNITY): Payer: Medicare Other

## 2023-04-05 ENCOUNTER — Encounter (HOSPITAL_COMMUNITY): Payer: Self-pay

## 2023-04-05 ENCOUNTER — Other Ambulatory Visit: Payer: Self-pay

## 2023-04-05 ENCOUNTER — Inpatient Hospital Stay (HOSPITAL_COMMUNITY)
Admission: EM | Admit: 2023-04-05 | Discharge: 2023-04-14 | DRG: 291 | Disposition: A | Discharge status: Hospice | Payer: Medicare Other | Attending: Internal Medicine | Admitting: Internal Medicine

## 2023-04-05 ENCOUNTER — Emergency Department (HOSPITAL_COMMUNITY): Payer: Medicare Other

## 2023-04-05 DIAGNOSIS — E1169 Type 2 diabetes mellitus with other specified complication: Secondary | ICD-10-CM | POA: Diagnosis present

## 2023-04-05 DIAGNOSIS — Z79890 Hormone replacement therapy (postmenopausal): Secondary | ICD-10-CM

## 2023-04-05 DIAGNOSIS — Z7984 Long term (current) use of oral hypoglycemic drugs: Secondary | ICD-10-CM

## 2023-04-05 DIAGNOSIS — Z9012 Acquired absence of left breast and nipple: Secondary | ICD-10-CM

## 2023-04-05 DIAGNOSIS — M069 Rheumatoid arthritis, unspecified: Secondary | ICD-10-CM | POA: Diagnosis present

## 2023-04-05 DIAGNOSIS — Z952 Presence of prosthetic heart valve: Secondary | ICD-10-CM

## 2023-04-05 DIAGNOSIS — F32A Depression, unspecified: Secondary | ICD-10-CM | POA: Diagnosis present

## 2023-04-05 DIAGNOSIS — I3481 Nonrheumatic mitral (valve) annulus calcification: Secondary | ICD-10-CM | POA: Diagnosis not present

## 2023-04-05 DIAGNOSIS — I4891 Unspecified atrial fibrillation: Secondary | ICD-10-CM | POA: Diagnosis present

## 2023-04-05 DIAGNOSIS — R42 Dizziness and giddiness: Secondary | ICD-10-CM | POA: Diagnosis not present

## 2023-04-05 DIAGNOSIS — Z833 Family history of diabetes mellitus: Secondary | ICD-10-CM

## 2023-04-05 DIAGNOSIS — E876 Hypokalemia: Secondary | ICD-10-CM | POA: Diagnosis present

## 2023-04-05 DIAGNOSIS — I10 Essential (primary) hypertension: Secondary | ICD-10-CM | POA: Diagnosis not present

## 2023-04-05 DIAGNOSIS — R55 Syncope and collapse: Secondary | ICD-10-CM | POA: Diagnosis not present

## 2023-04-05 DIAGNOSIS — E785 Hyperlipidemia, unspecified: Secondary | ICD-10-CM | POA: Diagnosis not present

## 2023-04-05 DIAGNOSIS — M810 Age-related osteoporosis without current pathological fracture: Secondary | ICD-10-CM | POA: Diagnosis not present

## 2023-04-05 DIAGNOSIS — L89322 Pressure ulcer of left buttock, stage 2: Secondary | ICD-10-CM | POA: Diagnosis not present

## 2023-04-05 DIAGNOSIS — I509 Heart failure, unspecified: Secondary | ICD-10-CM | POA: Diagnosis not present

## 2023-04-05 DIAGNOSIS — M419 Scoliosis, unspecified: Secondary | ICD-10-CM | POA: Diagnosis present

## 2023-04-05 DIAGNOSIS — Z8249 Family history of ischemic heart disease and other diseases of the circulatory system: Secondary | ICD-10-CM

## 2023-04-05 DIAGNOSIS — I083 Combined rheumatic disorders of mitral, aortic and tricuspid valves: Secondary | ICD-10-CM | POA: Diagnosis present

## 2023-04-05 DIAGNOSIS — Z87891 Personal history of nicotine dependence: Secondary | ICD-10-CM

## 2023-04-05 DIAGNOSIS — R64 Cachexia: Secondary | ICD-10-CM | POA: Diagnosis present

## 2023-04-05 DIAGNOSIS — Z91013 Allergy to seafood: Secondary | ICD-10-CM

## 2023-04-05 DIAGNOSIS — Z66 Do not resuscitate: Secondary | ICD-10-CM | POA: Diagnosis present

## 2023-04-05 DIAGNOSIS — I059 Rheumatic mitral valve disease, unspecified: Secondary | ICD-10-CM | POA: Diagnosis not present

## 2023-04-05 DIAGNOSIS — Z7901 Long term (current) use of anticoagulants: Secondary | ICD-10-CM

## 2023-04-05 DIAGNOSIS — E039 Hypothyroidism, unspecified: Secondary | ICD-10-CM | POA: Diagnosis present

## 2023-04-05 DIAGNOSIS — E119 Type 2 diabetes mellitus without complications: Secondary | ICD-10-CM

## 2023-04-05 DIAGNOSIS — Z91041 Radiographic dye allergy status: Secondary | ICD-10-CM

## 2023-04-05 DIAGNOSIS — R579 Shock, unspecified: Secondary | ICD-10-CM | POA: Diagnosis not present

## 2023-04-05 DIAGNOSIS — Z9221 Personal history of antineoplastic chemotherapy: Secondary | ICD-10-CM

## 2023-04-05 DIAGNOSIS — Z885 Allergy status to narcotic agent status: Secondary | ICD-10-CM

## 2023-04-05 DIAGNOSIS — R570 Cardiogenic shock: Secondary | ICD-10-CM | POA: Diagnosis not present

## 2023-04-05 DIAGNOSIS — I350 Nonrheumatic aortic (valve) stenosis: Secondary | ICD-10-CM | POA: Diagnosis not present

## 2023-04-05 DIAGNOSIS — Z923 Personal history of irradiation: Secondary | ICD-10-CM

## 2023-04-05 DIAGNOSIS — Z515 Encounter for palliative care: Secondary | ICD-10-CM

## 2023-04-05 DIAGNOSIS — R0902 Hypoxemia: Secondary | ICD-10-CM | POA: Diagnosis not present

## 2023-04-05 DIAGNOSIS — N390 Urinary tract infection, site not specified: Secondary | ICD-10-CM | POA: Diagnosis not present

## 2023-04-05 DIAGNOSIS — R54 Age-related physical debility: Secondary | ICD-10-CM | POA: Diagnosis present

## 2023-04-05 DIAGNOSIS — I700 Atherosclerosis of aorta: Secondary | ICD-10-CM | POA: Diagnosis not present

## 2023-04-05 DIAGNOSIS — Z79899 Other long term (current) drug therapy: Secondary | ICD-10-CM

## 2023-04-05 DIAGNOSIS — I5031 Acute diastolic (congestive) heart failure: Secondary | ICD-10-CM | POA: Diagnosis not present

## 2023-04-05 DIAGNOSIS — Z888 Allergy status to other drugs, medicaments and biological substances status: Secondary | ICD-10-CM

## 2023-04-05 DIAGNOSIS — L89312 Pressure ulcer of right buttock, stage 2: Secondary | ICD-10-CM | POA: Diagnosis present

## 2023-04-05 DIAGNOSIS — I342 Nonrheumatic mitral (valve) stenosis: Secondary | ICD-10-CM | POA: Diagnosis not present

## 2023-04-05 DIAGNOSIS — I340 Nonrheumatic mitral (valve) insufficiency: Secondary | ICD-10-CM | POA: Diagnosis not present

## 2023-04-05 DIAGNOSIS — I517 Cardiomegaly: Secondary | ICD-10-CM | POA: Diagnosis not present

## 2023-04-05 DIAGNOSIS — Z6821 Body mass index (BMI) 21.0-21.9, adult: Secondary | ICD-10-CM

## 2023-04-05 DIAGNOSIS — F1190 Opioid use, unspecified, uncomplicated: Secondary | ICD-10-CM | POA: Diagnosis present

## 2023-04-05 DIAGNOSIS — D649 Anemia, unspecified: Secondary | ICD-10-CM | POA: Diagnosis not present

## 2023-04-05 DIAGNOSIS — Z794 Long term (current) use of insulin: Secondary | ICD-10-CM

## 2023-04-05 DIAGNOSIS — I480 Paroxysmal atrial fibrillation: Secondary | ICD-10-CM | POA: Diagnosis present

## 2023-04-05 DIAGNOSIS — Z79891 Long term (current) use of opiate analgesic: Secondary | ICD-10-CM

## 2023-04-05 DIAGNOSIS — I110 Hypertensive heart disease with heart failure: Principal | ICD-10-CM | POA: Diagnosis present

## 2023-04-05 DIAGNOSIS — I052 Rheumatic mitral stenosis with insufficiency: Secondary | ICD-10-CM | POA: Diagnosis not present

## 2023-04-05 DIAGNOSIS — B9620 Unspecified Escherichia coli [E. coli] as the cause of diseases classified elsewhere: Secondary | ICD-10-CM | POA: Diagnosis not present

## 2023-04-05 DIAGNOSIS — R441 Visual hallucinations: Secondary | ICD-10-CM | POA: Diagnosis not present

## 2023-04-05 DIAGNOSIS — I272 Pulmonary hypertension, unspecified: Secondary | ICD-10-CM | POA: Diagnosis not present

## 2023-04-05 DIAGNOSIS — J45909 Unspecified asthma, uncomplicated: Secondary | ICD-10-CM | POA: Diagnosis present

## 2023-04-05 DIAGNOSIS — G894 Chronic pain syndrome: Secondary | ICD-10-CM | POA: Diagnosis present

## 2023-04-05 DIAGNOSIS — E1165 Type 2 diabetes mellitus with hyperglycemia: Secondary | ICD-10-CM | POA: Diagnosis not present

## 2023-04-05 DIAGNOSIS — Z886 Allergy status to analgesic agent status: Secondary | ICD-10-CM

## 2023-04-05 DIAGNOSIS — I5033 Acute on chronic diastolic (congestive) heart failure: Secondary | ICD-10-CM | POA: Diagnosis present

## 2023-04-05 DIAGNOSIS — I21A1 Myocardial infarction type 2: Secondary | ICD-10-CM | POA: Diagnosis present

## 2023-04-05 DIAGNOSIS — Z880 Allergy status to penicillin: Secondary | ICD-10-CM

## 2023-04-05 DIAGNOSIS — R0602 Shortness of breath: Secondary | ICD-10-CM | POA: Diagnosis not present

## 2023-04-05 DIAGNOSIS — Z853 Personal history of malignant neoplasm of breast: Secondary | ICD-10-CM

## 2023-04-05 DIAGNOSIS — R531 Weakness: Secondary | ICD-10-CM | POA: Diagnosis not present

## 2023-04-05 DIAGNOSIS — Z7189 Other specified counseling: Secondary | ICD-10-CM | POA: Diagnosis not present

## 2023-04-05 LAB — PROTIME-INR
INR: 1.3 — ABNORMAL HIGH (ref 0.8–1.2)
Prothrombin Time: 16.1 s — ABNORMAL HIGH (ref 11.4–15.2)

## 2023-04-05 LAB — GLUCOSE, CAPILLARY: Glucose-Capillary: 168 mg/dL — ABNORMAL HIGH (ref 70–99)

## 2023-04-05 LAB — URINALYSIS, W/ REFLEX TO CULTURE (INFECTION SUSPECTED)
Bilirubin Urine: NEGATIVE
Glucose, UA: NEGATIVE mg/dL
Ketones, ur: NEGATIVE mg/dL
Nitrite: NEGATIVE
Protein, ur: >=300 mg/dL — AB
Specific Gravity, Urine: 1.018 (ref 1.005–1.030)
pH: 5 (ref 5.0–8.0)

## 2023-04-05 LAB — CBC WITH DIFFERENTIAL/PLATELET
Abs Immature Granulocytes: 0.05 10*3/uL (ref 0.00–0.07)
Basophils Absolute: 0 10*3/uL (ref 0.0–0.1)
Basophils Relative: 0 %
Eosinophils Absolute: 0 10*3/uL (ref 0.0–0.5)
Eosinophils Relative: 0 %
HCT: 35.2 % — ABNORMAL LOW (ref 36.0–46.0)
Hemoglobin: 9.9 g/dL — ABNORMAL LOW (ref 12.0–15.0)
Immature Granulocytes: 0 %
Lymphocytes Relative: 7 %
Lymphs Abs: 0.9 10*3/uL (ref 0.7–4.0)
MCH: 23.6 pg — ABNORMAL LOW (ref 26.0–34.0)
MCHC: 28.1 g/dL — ABNORMAL LOW (ref 30.0–36.0)
MCV: 83.8 fL (ref 80.0–100.0)
Monocytes Absolute: 0.6 10*3/uL (ref 0.1–1.0)
Monocytes Relative: 5 %
Neutro Abs: 10.9 10*3/uL — ABNORMAL HIGH (ref 1.7–7.7)
Neutrophils Relative %: 88 %
Platelets: 330 10*3/uL (ref 150–400)
RBC: 4.2 MIL/uL (ref 3.87–5.11)
RDW: 17.1 % — ABNORMAL HIGH (ref 11.5–15.5)
WBC: 12.5 10*3/uL — ABNORMAL HIGH (ref 4.0–10.5)
nRBC: 0 % (ref 0.0–0.2)

## 2023-04-05 LAB — COMPREHENSIVE METABOLIC PANEL
ALT: 18 U/L (ref 0–44)
AST: 22 U/L (ref 15–41)
Albumin: 3.3 g/dL — ABNORMAL LOW (ref 3.5–5.0)
Alkaline Phosphatase: 60 U/L (ref 38–126)
Anion gap: 13 (ref 5–15)
BUN: 20 mg/dL (ref 8–23)
CO2: 27 mmol/L (ref 22–32)
Calcium: 8.6 mg/dL — ABNORMAL LOW (ref 8.9–10.3)
Chloride: 99 mmol/L (ref 98–111)
Creatinine, Ser: 0.84 mg/dL (ref 0.44–1.00)
GFR, Estimated: >60 mL/min (ref >60)
Glucose, Bld: 207 mg/dL — ABNORMAL HIGH (ref 70–99)
Potassium: 3 mmol/L — ABNORMAL LOW (ref 3.5–5.1)
Sodium: 139 mmol/L (ref 135–145)
Total Bilirubin: 0.6 mg/dL (ref <1.2)
Total Protein: 6.7 g/dL (ref 6.5–8.1)

## 2023-04-05 LAB — MAGNESIUM: Magnesium: 1.2 mg/dL — ABNORMAL LOW (ref 1.7–2.4)

## 2023-04-05 LAB — TROPONIN I (HIGH SENSITIVITY)
Troponin I (High Sensitivity): 71 ng/L — ABNORMAL HIGH (ref <18)
Troponin I (High Sensitivity): 67 ng/L — ABNORMAL HIGH (ref <18)

## 2023-04-05 LAB — BRAIN NATRIURETIC PEPTIDE: B Natriuretic Peptide: 1498.3 pg/mL — ABNORMAL HIGH (ref 0.0–100.0)

## 2023-04-05 MED ORDER — POTASSIUM CHLORIDE CRYS ER 20 MEQ PO TBCR
40.0000 meq | EXTENDED_RELEASE_TABLET | ORAL | Status: AC
  Administered 2023-04-05 – 2023-04-06 (×2): 40 meq via ORAL
  Filled 2023-04-05 (×2): qty 2

## 2023-04-05 MED ORDER — SODIUM CHLORIDE 0.9 % IV SOLN: 250.0000 mL | INTRAVENOUS | Status: AC | PRN

## 2023-04-05 MED ORDER — FUROSEMIDE 10 MG/ML IJ SOLN
60.0000 mg | Freq: Once | INTRAMUSCULAR | Status: AC
  Administered 2023-04-05: 60 mg via INTRAVENOUS
  Filled 2023-04-05: qty 8

## 2023-04-05 MED ORDER — FUROSEMIDE 10 MG/ML IJ SOLN
40.0000 mg | Freq: Two times a day (BID) | INTRAMUSCULAR | Status: DC
Start: 2023-04-06 — End: 2023-04-08
  Administered 2023-04-06 – 2023-04-08 (×5): 40 mg via INTRAVENOUS
  Filled 2023-04-05 (×5): qty 4

## 2023-04-05 MED ORDER — ACETAMINOPHEN 325 MG PO TABS
650.0000 mg | ORAL_TABLET | ORAL | Status: DC | PRN
Start: 2023-04-05 — End: 2023-04-14
  Administered 2023-04-09: 650 mg via ORAL
  Filled 2023-04-05: qty 2

## 2023-04-05 MED ORDER — MORPHINE SULFATE ER 15 MG PO TBCR
30.0000 mg | EXTENDED_RELEASE_TABLET | Freq: Once | ORAL | Status: AC
  Administered 2023-04-05: 30 mg via ORAL
  Filled 2023-04-05: qty 2

## 2023-04-05 MED ORDER — OXYCODONE-ACETAMINOPHEN 7.5-325 MG PO TABS
1.0000 | ORAL_TABLET | Freq: Four times a day (QID) | ORAL | Status: DC | PRN
  Administered 2023-04-06 – 2023-04-14 (×13): 1 via ORAL
  Filled 2023-04-05 (×13): qty 1

## 2023-04-05 MED ORDER — POTASSIUM CHLORIDE 10 MEQ/100ML IV SOLN
10.0000 meq | INTRAVENOUS | Status: AC
  Administered 2023-04-05 (×2): 10 meq via INTRAVENOUS
  Filled 2023-04-05 (×2): qty 100

## 2023-04-05 MED ORDER — ONDANSETRON HCL 4 MG/2ML IJ SOLN
4.0000 mg | Freq: Four times a day (QID) | INTRAMUSCULAR | Status: DC | PRN
  Administered 2023-04-11: 4 mg via INTRAVENOUS
  Filled 2023-04-05 (×2): qty 2

## 2023-04-05 MED ORDER — PRAVASTATIN SODIUM 40 MG PO TABS
40.0000 mg | ORAL_TABLET | Freq: Every evening | ORAL | Status: DC
  Administered 2023-04-05 – 2023-04-12 (×6): 40 mg via ORAL
  Filled 2023-04-05 (×5): qty 1
  Filled 2023-04-05 (×2): qty 2

## 2023-04-05 MED ORDER — MAGNESIUM SULFATE 4 GM/100ML IV SOLN
4.0000 g | Freq: Once | INTRAVENOUS | Status: AC
  Administered 2023-04-05: 4 g via INTRAVENOUS
  Filled 2023-04-05: qty 100

## 2023-04-05 MED ORDER — SODIUM CHLORIDE 0.9% FLUSH
3.0000 mL | Freq: Two times a day (BID) | INTRAVENOUS | Status: DC
  Administered 2023-04-07 – 2023-04-14 (×12): 3 mL via INTRAVENOUS

## 2023-04-05 MED ORDER — HEPARIN BOLUS VIA INFUSION
2700.0000 [IU] | Freq: Once | INTRAVENOUS | Status: AC
  Administered 2023-04-06: 2700 [IU] via INTRAVENOUS
  Filled 2023-04-05: qty 2700

## 2023-04-05 MED ORDER — METOPROLOL TARTRATE 5 MG/5ML IV SOLN
2.5000 mg | INTRAVENOUS | Status: DC | PRN
Start: 2023-04-05 — End: 2023-04-12

## 2023-04-05 MED ORDER — HEPARIN (PORCINE) 25000 UT/250ML-% IV SOLN
1000.0000 [IU]/h | INTRAVENOUS | Status: DC
  Administered 2023-04-06: 800 [IU]/h via INTRAVENOUS
  Administered 2023-04-07 – 2023-04-09 (×4): 950 [IU]/h via INTRAVENOUS
  Administered 2023-04-10 – 2023-04-12 (×3): 1000 [IU]/h via INTRAVENOUS
  Filled 2023-04-05 (×7): qty 250

## 2023-04-05 MED ORDER — SODIUM CHLORIDE 0.9% FLUSH: 3.0000 mL | INTRAVENOUS | Status: DC | PRN

## 2023-04-05 MED ORDER — HYDROCODONE-ACETAMINOPHEN 10-325 MG PO TABS: 1.0000 | ORAL_TABLET | Freq: Four times a day (QID) | ORAL | Status: DC | PRN

## 2023-04-05 MED ORDER — INSULIN ASPART 100 UNIT/ML IJ SOLN: 0.0000 [IU] | Freq: Every day | INTRAMUSCULAR | Status: DC

## 2023-04-05 MED ORDER — INSULIN ASPART 100 UNIT/ML IJ SOLN
0.0000 [IU] | Freq: Three times a day (TID) | INTRAMUSCULAR | Status: DC
  Administered 2023-04-06: 1 [IU] via SUBCUTANEOUS
  Administered 2023-04-06 – 2023-04-07 (×4): 2 [IU] via SUBCUTANEOUS
  Administered 2023-04-07: 1 [IU] via SUBCUTANEOUS
  Administered 2023-04-08: 2 [IU] via SUBCUTANEOUS
  Administered 2023-04-08: 3 [IU] via SUBCUTANEOUS
  Administered 2023-04-09 – 2023-04-10 (×3): 2 [IU] via SUBCUTANEOUS
  Administered 2023-04-11: 1 [IU] via SUBCUTANEOUS
  Administered 2023-04-11: 2 [IU] via SUBCUTANEOUS
  Administered 2023-04-11: 7 [IU] via SUBCUTANEOUS
  Administered 2023-04-12: 3 [IU] via SUBCUTANEOUS
  Administered 2023-04-12 (×2): 2 [IU] via SUBCUTANEOUS

## 2023-04-05 NOTE — Progress Notes (Addendum)
PHARMACY - ANTICOAGULATION CONSULT NOTE  Pharmacy Consult for heparin Indication: atrial fibrillation  Allergies  Allergen Reactions   Fish Allergy Anaphylaxis and Hives   Amaryl [Glimepiride] Itching   Avalide [Irbesartan-Hydrochlorothiazide] Itching   Avandia [Rosiglitazone] Itching   Byetta 10 Mcg Pen [Exenatide] Itching   Codeine Itching    Other reaction(s): Unknown   Demerol [Meperidine Hcl] Itching   Glucovance [Glyburide-Metformin] Itching   Iodinated Contrast Media Itching    Other reaction(s):itching   Lisinopril Itching   Naprosyn [Naproxen] Hives   Penicillins Itching and Other (See Comments)    Has patient had a PCN reaction causing immediate rash, facial/tongue/throat swelling, SOB or lightheadedness with hypotension: no Has patient had a PCN reaction causing severe rash involving mucus membranes or skin necrosis: no Has patient had a PCN reaction that required hospitalization: yes Has patient had a PCN reaction occurring within the last 10 years: no If all of the above answers are "NO", then may proceed with Cephalosporin use.    Patient Measurements: Height: 5\' 3"  (160 cm) IBW/kg (Calculated) : 52.4 Heparin Dosing Weight: 54 kg (weight from October 2024)  Vital Signs: Temp: 97.7 F (36.5 C) (12/04 2122) Temp Source: Oral (12/04 2122) BP: 148/97 (12/04 2122) Pulse Rate: 110 (12/04 2122)  Labs: Recent Labs    04/05/23 1703 04/05/23 1922  HGB 9.9*  --   HCT 35.2*  --   PLT 330  --   LABPROT 16.1*  --   INR 1.3*  --   CREATININE 0.84  --   TROPONINIHS 71* 67*    CrCl cannot be calculated (Unknown ideal weight.).   Medical History: Past Medical History:  Diagnosis Date   Asthma, mild    Intermittent   Atrial fibrillation [facility])    Feb 2023   Cancer [facility])    Chronic back pain    Echocardiogram abnormal 09/2008   With mild aortic valve and mitral valve regurgitation   Heart murmur    History of left breast cancer 1999   Hyperlipidemia     Hypertension    Hypothyroidism    IBS (irritable bowel syndrome)    Lactose intolerance    Osteoporosis    Peptic ulcer disease    rheumatoid Arthritis    Type II diabetes mellitus (HCC)    [facility] 08-2012 checks cbg bid   Ulcer, duodenal, acute, with obstruction 1999   Wears glasses     Medications:  Warfarin PTA - currently on hold  Assessment: 72 year old female with history of atrial fibrillation on warfarin PTA. INR subtherapeutic on admission. Warfarin to be held pending rule out need for procedure. Pharmacy consulted for heparin management.  Goal of Therapy:  Heparin level 0.3-0.7 units/ml Monitor platelets by anticoagulation protocol: Yes   Plan:  -Heparin 2700 unit bolus -Start heparin infusion at 800 units/hr -Check 8 hour heparin level -Daily CBC -Follow up appropriate restart of warfarin  Pricilla Riffle, PharmD, BCPS Clinical Pharmacist 04/05/2023 9:44 PM

## 2023-04-05 NOTE — ED Provider Notes (Signed)
[place] EMERGENCY DEPARTMENT AT [facility] Provider Note   CSN: [id number] Arrival date & time: 04/05/23  1539     History {Add pertinent medical, surgical, social history, OB history to HPI:1} Chief Complaint  Patient presents with   Near Syncope   Weakness    [name] is a 72 y.o. female.  Patient is a 72 year old female with a history of diabetes, hypertension, asthma, PUD, IBS, atrial fibrillation on diltiazem and warfarin who is presenting today with multiple complaints.  Patient reports over the last 2 to 3 weeks she has had worsening swelling in bilateral lower extremities, dyspnea on exertion and intermittent episodes of dizziness.  She has been communicating with her doctor and reports taking a fluid pill which has not changed the swelling in her legs.  She does not think its gotten a lot worse in the last few days but is definitely not gotten better.  She has not had any chest pain, cough, fever.  However today she started having abdominal cramping and felt like she was going to have diarrhea and needed to go to the bathroom.  However she reports when she got to the bathroom and had the loose stool she started feeling lightheaded like she was going to pass out and could not catch her breath.  She reports that now she feels better.  She denies any shortness of breath at the moment.  She has not had any black or tarry stools.  She denies any nausea or vomiting and has been eating.  Other than the diuretic her doctor had ordered her she has not changed any of her medications.  She was supposed to get surgery on her eyes tomorrow but as she continues to feel worse she called 911 today for help.  The history is provided by the patient and medical records.  Near Syncope  Weakness Associated symptoms: near-syncope        Home Medications Prior to Admission medications   Medication Sig Start Date End Date Taking? Authorizing Provider  abatacept (ORENCIA) 250  MG injection Inject 250 mg into the vein every 30 (thirty) days.    [provider]  buPROPion (WELLBUTRIN XL) 150 MG 24 hr tablet Take 150 mg by mouth every evening. 01/31/20   [provider]  denosumab (PROLIA) 60 MG/ML SOSY injection Inject 60 mg into the skin every 6 (six) months.    [provider]  diltiazem (TIADYLT ER) 300 MG 24 hr capsule Take 1 capsule (300 mg total) by mouth daily. 11/07/22   Tolia, Sunit, DO  escitalopram (LEXAPRO) 10 MG tablet Take 10 mg by mouth every evening.    [provider]  esomeprazole (NEXIUM) 20 MG capsule Take 40 mg by mouth every evening.    [provider]  folic acid (FOLVITE) 1 MG tablet Take 2 mg by mouth every evening.    [provider]  furosemide (LASIX) 20 MG tablet Take 1 tablet (20 mg total) by mouth daily. 03/17/23 03/16/24  Tolia, Sunit, DO  hydrochlorothiazide (HYDRODIURIL) 25 MG tablet TAKE 1 TABLET BY MOUTH EVERY DAY IN THE MORNING 12/21/22   Tolia, Sunit, DO  HYDROcodone-acetaminophen (NORCO) 10-325 MG tablet Take 1 tablet by mouth every 6 (six) hours as needed for severe pain (post-operatively). 11/19/21   Loletta Parish., MD  hydrocortisone (ANUSOL-HC) 25 MG suppository Place 25 mg rectally 2 (two) times daily as needed for hemorrhoids.     [provider]  hydroxychloroquine (PLAQUENIL) 200  MG tablet Take 200 mg by mouth every evening.    [provider]  levothyroxine (SYNTHROID) 175 MCG tablet Take 175 mcg by mouth every evening. 03/18/21   [provider]  losartan (COZAAR) 50 MG tablet TAKE 1 TABLET BY MOUTH EVERY DAY 12/26/22   Tolia, Sunit, DO  metFORMIN (GLUCOPHAGE-XR) 500 MG 24 hr tablet Take 2,000 mg by mouth every evening.    [provider]  morphine (MS CONTIN) 30 MG 12 hr tablet Take 30 mg by mouth every 12 (twelve) hours. 300 am and 300 pm 07/06/20   [provider]  Multiple Vitamin (MULTIVITAMIN WITH MINERALS) TABS tablet Take  1 tablet by mouth every evening.    [provider]  oxyCODONE-acetaminophen (PERCOCET) 7.5-325 MG tablet Take 1 tablet by mouth every 6 (six) hours as needed for moderate pain (pain score 4-6) or severe pain (pain score 7-10). 02/20/23   [provider]  pravastatin (PRAVACHOL) 40 MG tablet Take 40 mg by mouth every evening.    [provider]  warfarin (COUMADIN) 5 MG tablet TAKE 1 TABLET (5 MG TOTAL) BY MOUTH DAILY. Patient taking differently: Take 2.5 mg by mouth daily. 12/22/22   Tolia, Sunit, DO      Allergies    Fish allergy, Amaryl [glimepiride], Avalide [irbesartan-hydrochlorothiazide], Avandia [rosiglitazone], Byetta 10 mcg pen [exenatide], Codeine, Demerol [meperidine hcl], Glucovance [glyburide-metformin], Iodinated contrast media, Lisinopril, Naprosyn [naproxen], and Penicillins    Review of Systems   Review of Systems  Cardiovascular:  Positive for near-syncope.  Neurological:  Positive for weakness.    Physical Exam Updated Vital Signs BP 135/79   Pulse (!) 118   Temp 98.4 F (36.9 C) (Oral)   Resp 15   Ht 5\' 3"  (1.6 m)   SpO2 96%   BMI 20.02 kg/m  Physical Exam Vitals and nursing note reviewed.  Constitutional:      General: She is not in acute distress.    Appearance: She is well-developed.  HENT:     Head: Normocephalic and atraumatic.  Eyes:     Pupils: Pupils are equal, round, and reactive to light.  Cardiovascular:     Rate and Rhythm: Regular rhythm. Tachycardia present.     Pulses: Normal pulses.     Heart sounds: Normal heart sounds. No murmur heard.    No friction rub.  Pulmonary:     Effort: Pulmonary effort is normal.     Breath sounds: Examination of the right-middle field reveals rales. Examination of the left-middle field reveals rales. Examination of the right-lower field reveals rales. Examination of the left-lower field reveals rales. Rales present. No wheezing.  Abdominal:     General: Bowel sounds are normal.  There is no distension.     Palpations: Abdomen is soft.     Tenderness: There is no abdominal tenderness. There is no guarding or rebound.  Musculoskeletal:        General: No tenderness. Normal range of motion.     Right lower leg: Edema present.     Left lower leg: Edema present.     Comments: No edema  Skin:    General: Skin is warm and dry.     Coloration: Skin is pale.     Findings: No rash.  Neurological:     Mental Status: She is alert and oriented to person, place, and time. Mental status is at baseline.     Cranial Nerves: No cranial nerve deficit.  Psychiatric:  Behavior: Behavior normal.     ED Results / Procedures / Treatments   Labs (all labs ordered are listed, but only abnormal results are displayed) Labs Reviewed  CBC WITH DIFFERENTIAL/PLATELET - Abnormal; Notable for the following components:      Result Value   WBC 12.5 (*)    Hemoglobin 9.9 (*)    HCT 35.2 (*)    MCH 23.6 (*)    MCHC 28.1 (*)    RDW 17.1 (*)    Neutro Abs 10.9 (*)    All other components within normal limits  COMPREHENSIVE METABOLIC PANEL - Abnormal; Notable for the following components:   Potassium 3.0 (*)    Glucose, Bld 207 (*)    Calcium 8.6 (*)    Albumin 3.3 (*)    All other components within normal limits  URINALYSIS, W/ REFLEX TO CULTURE (INFECTION SUSPECTED) - Abnormal; Notable for the following components:   Color, Urine AMBER (*)    APPearance CLOUDY (*)    Hgb urine dipstick SMALL (*)    Protein, ur >=300 (*)    Leukocytes,Ua LARGE (*)    Bacteria, UA MANY (*)    All other components within normal limits  BRAIN NATRIURETIC PEPTIDE - Abnormal; Notable for the following components:   B Natriuretic Peptide 1,498.3 (*)    All other components within normal limits  PROTIME-INR - Abnormal; Notable for the following components:   Prothrombin Time 16.1 (*)    INR 1.3 (*)    All other components within normal limits  TROPONIN I (HIGH SENSITIVITY) - Abnormal; Notable  for the following components:   Troponin I (High Sensitivity) 71 (*)    All other components within normal limits  TROPONIN I (HIGH SENSITIVITY) - Abnormal; Notable for the following components:   Troponin I (High Sensitivity) 67 (*)    All other components within normal limits  URINE CULTURE    EKG EKG Interpretation Date/Time:  Wednesday April 05 2023 16:52:44 EST Ventricular Rate:  127 PR Interval:  158 QRS Duration:  76 QT Interval:  324 QTC Calculation: 471 R Axis:   125  Text Interpretation: new Sinus tachycardia Atrial premature complex Probable left atrial enlargement Right axis deviation Borderline repolarization abnormality Confirmed by Gwyneth Sprout ([id number]) on 04/05/2023 5:15:07 PM  Radiology DG Chest Port 1 View  Result Date: 04/05/2023 CLINICAL DATA:  c/o dizziness, sob, and weakness on exertion. Per EMS pt felt like she was about to have a bowel movement and went to the restroom. She couldn't go so she stood up and started feeling dizzy. EXAM: PORTABLE CHEST 1 VIEW COMPARISON:  Chest x-ray 05/14/2018 FINDINGS: Marked patient rotation. Enlarged cardiac silhouette with abnormal left heart border. No focal consolidation. No pulmonary edema. No pleural effusion. No pneumothorax. No acute osseous abnormality. IMPRESSION: Enlarged cardiac silhouette with abnormal left heart border. Finding may be due to patient rotation/positioning. Recommend repeat PA and lateral view of the chest for further evaluation. If patient clinically unstable, consider CT chest WITH intravenous contrast (CTA or CTPA if clinically indicated) for further evaluation. Electronically Signed   By: Tish Frederickson M.D.   On: 04/05/2023 18:06    Procedures Procedures  {Document cardiac monitor, telemetry assessment procedure when appropriate:1}  Medications Ordered in ED Medications  oxyCODONE-acetaminophen (PERCOCET) 7.5-325 MG per tablet 1 tablet (has no administration in time range)  potassium  chloride 10 mEq in 100 mL IVPB (has no administration in time range)  morphine (MS CONTIN) 12 hr tablet 30 mg (30  mg Oral Given 04/05/23 1929)  furosemide (LASIX) injection 60 mg (60 mg Intravenous Given 04/05/23 1929)    ED Course/ Medical Decision Making/ A&P   {   Click here for ABCD2, HEART and other calculatorsREFRESH Note before signing :1}                              Medical Decision Making Amount and/or Complexity of Data Reviewed External Data Reviewed: notes. Labs: ordered. Decision-making details documented in ED Course. Radiology: ordered and independent interpretation performed. Decision-making details documented in ED Course. ECG/medicine tests: ordered and independent interpretation performed. Decision-making details documented in ED Course.  Risk Prescription drug management. Decision regarding hospitalization.   Pt with multiple medical problems and comorbidities and presenting today with a complaint that caries a high risk for morbidity and mortality.  Here today with dyspnea on exertion, any syncope, noted persistent tachycardia.  Patient is on continuous cardiac monitoring which I independently interpreted and she is in sinus rhythm consistently without evidence of A-fib at this time.  Patient is denying any chest pain and low suspicion for ACS at this time and she denies any infectious symptoms so low suspicion for pneumonia however patient is pale and on blood thinner and concern for anemia versus CHF due to the significant swelling versus new renal disease or liver disease.  There is no wheezing on exam and sats are 94% on room air.  I independently interpreted patient's labs and EKG which shows a sinus tachycardia but no significant ST changes.  CBC today with leukocytosis of 12 without significant change in patient's hemoglobin, UA does have a large leukocytes with 21-50 white blood cells and many bacteria most likely chronic colonization as patient is not having  symptoms at this time.  CMP with hypokalemia of 3.0 and normal creatinine and troponin elevated at 71 and BNP elevated at 1498, INR is subtherapeutic at 1.3.  I have independently visualized and interpreted pt's images today.  Chest x-ray today with significant enlargement of the heart.  Bedside ultrasound without significant pericardial effusion and low suspicion for tamponade.  Radiology does report that this could be positioning and patient can have a repeat AP lateral or patient could have a CT with contrast.  She is not currently unstable at this time.  She also has a contrast allergy.  Patient given IV Lasix and potassium replacement.  Will admit for further care.  Will consult with cardiology.  Patient will require an echo.  8:00 PM Spoke with Dr. Wyline Mood with cardiology and they will consult on the patient.  Will admit to hospitalist.  CRITICAL CARE Performed by: Chantel Teti Total critical care time: 30 minutes Critical care time was exclusive of separately billable procedures and treating other patients. Critical care was necessary to treat or prevent imminent or life-threatening deterioration. Critical care was time spent personally by me on the following activities: development of treatment plan with patient and/or surrogate as well as nursing, discussions with consultants, evaluation of patient's response to treatment, examination of patient, obtaining history from patient or surrogate, ordering and performing treatments and interventions, ordering and review of laboratory studies, ordering and review of radiographic studies, pulse oximetry and re-evaluation of patient's condition.     {Document critical care time when appropriate:1} {Document review of labs and clinical decision tools ie heart score, Chads2Vasc2 etc:1}  {Document your independent review of radiology images, and any outside records:1} {Document your discussion with family  members, caretakers, and with  consultants:1} {Document social determinants of health affecting pt's care:1} {Document your decision making why or why not admission, treatments were needed:1} Final Clinical Impression(s) / ED Diagnoses Final diagnoses:  Acute congestive heart failure, unspecified heart failure type (HCC)  Hypokalemia    Rx / DC Orders ED Discharge Orders     None

## 2023-04-05 NOTE — ED Triage Notes (Addendum)
Pt BIBA from home, c/o dizziness, sob, and weakness on exertion.  Per EMS pt felt like she was about to have a bowel movement and went to the restroom. She couldn't go so she stood up and started feeling dizzy.  She later had fecal incontinence. Denies LOC, fall, and head strike.  Hx of A fib and mastectomy on left side. Pt is also scheduled for cataract surgery tomorrow. Pt stated she is getting a work up for CHF.   BP 156/86 HR 109 O2 96 RA

## 2023-04-05 NOTE — H&P (Signed)
History and Physical    Patient: [name] ZOX:[id number] DOB: [birth year] DOA: 04/05/2023 DOS: the patient was seen and examined on 04/05/2023 PCP: Noberto Retort, MD  Patient coming from: Home. Lives alone.   Chief Complaint:  Chief Complaint  Patient presents with   Near Syncope   Weakness   HPI: [name] is a 72 y.o. female with PMH of diastolic CHF, DM-2, A-fib on warfarin, RA, chronic pain syndrome, HTN, osteoporosis hypothyroidism presented to ED with near syncope and weakness.  Patient felt like going to bathroom about 12 PM this afternoon but was too weak and dizzy to get up and ended up having bowel accident.  After that, she felt short of breath and with an outside to get some air.  She denies abdominal pain, nausea or vomiting.  She has progressive BLE edema that did not improve by increasing HCTZ, leg elevation and wearing compression socks.  She also reports dyspnea with exertion, orthopnea and PND.  She reports good compliance with her diuretics including Lasix and HCTZ.  Dilator was increased from 25 to 50 mg by her PCP about 3 weeks ago.  She have called her cardiologist office previously and does to get echocardiogram that has not happened yet.  She denies fever, chills, runny nose, sore throat, chest pain, nausea, vomiting, abdominal pain or UTI symptoms.  Patient also reports compliance with her warfarin.   Reports living alone.  Denies smoking cigarette, drinking alcohol recreational drug use.  She is not interested in cardiopulmonary resuscitation in an event of sudden cardiopulmonary arrest.  She confirms DO NOT RESUSCITATE/DO NOT INTUBATE status.  In ED, tachycardic to 120s but improved 110s.  Other vital stable.  K3.0.  Glucose 207.  WBC 12.5 with left shift.  Hgb 9.9 (10.5 in 01/2023).  BNP elevated to 1500.  Troponin 71 and 67.  EKG features sinus tachycardia at 127 with PACs.  Personally reviewed portable CXR with increased cardiac silhouette and abnormal  left heart border.  Per EDP, bedside ultrasound without pericardial effusion.  Recent left IV Lasix 60 mg x 1 and MS Contin.  Cardiology consulted.  Admission requested for acute CHF.   Review of Systems: As mentioned in the history of present illness. All other systems reviewed and are negative. Past Medical History:  Diagnosis Date   Asthma, mild    Intermittent   Atrial fibrillation [facility])    Feb 2023   Cancer [facility])    Chronic back pain    Echocardiogram abnormal 09/2008   With mild aortic valve and mitral valve regurgitation   Heart murmur    History of left breast cancer 1999   Hyperlipidemia    Hypertension    Hypothyroidism    IBS (irritable bowel syndrome)    Lactose intolerance    Osteoporosis    Peptic ulcer disease    rheumatoid Arthritis    Type II diabetes mellitus (HCC)    [facility] 08-2012 checks cbg bid   Ulcer, duodenal, acute, with obstruction 1999   Wears glasses    Past Surgical History:  Procedure Laterality Date   ABDOMINAL HYSTERECTOMY     BREAST IMPLANT REMOVAL     Breast implant infected removed implant 06/06 infected and removed 11/05   BREAST LUMPECTOMY Left 12/1997   Left breast   BREAST RECONSTRUCTION     CARDIOVERSION N/A 08/11/2021   Procedure: CARDIOVERSION;  Surgeon: Tessa Lerner, DO;  Location: [facility];  Service: Cardiovascular;  Laterality: N/A;   CHOLECYSTECTOMY  CYSTOSCOPY W/ URETERAL STENT PLACEMENT Left 01/19/2018   Procedure: CYSTOSCOPY WITH RETROGRADE PYELOGRAM/URETERAL STENT PLACEMENT;  Surgeon: Marcine Matar, MD;  Location: [facility];  Service: Urology;  Laterality: Left;   CYSTOSCOPY W/ URETERAL STENT PLACEMENT Right 10/27/2021   Procedure: CYSTOSCOPY WITH RETROGRADE PYELOGRAM/URETERAL STENT PLACEMENT;  Surgeon: Sebastian Ache, MD;  Location: [facility] OR;  Service: Urology;  Laterality: Right;   CYSTOSCOPY WITH RETROGRADE PYELOGRAM, URETEROSCOPY AND STENT PLACEMENT Right 11/19/2021   Procedure: CYSTOSCOPY WITH RETROGRADE PYELOGRAM,  URETEROSCOPY AND STENT EXCHANGE;  Surgeon: Sebastian Ache, MD;  Location: [facility];  Service: Urology;  Laterality: Right;  1 HR   CYSTOSCOPY WITH STENT PLACEMENT  10/27/2021   cone   EXTRACORPOREAL SHOCK WAVE LITHOTRIPSY Left 02/15/2018   Procedure: LEFT EXTRACORPOREAL SHOCK WAVE LITHOTRIPSY (ESWL);  Surgeon: Malen Gauze, MD;  Location: [facility];  Service: Urology;  Laterality: Left;   HOLMIUM LASER APPLICATION Right 11/19/2021   Procedure: HOLMIUM LASER APPLICATION;  Surgeon: Sebastian Ache, MD;  Location: [facility];  Service: Urology;  Laterality: Right;   MASTECTOMY  07/2003   Left   other     Left Arm FX   OTHER SURGICAL HISTORY     Hysterectomy- Ovaries intact    OTHER SURGICAL HISTORY     Fracture Left HIp pinning sx done   PARTIAL GASTRECTOMY  1999   due to ulcers, earlier sx done on stomach also   Social History:  reports that she quit smoking about 22 years ago. Her smoking use included cigarettes. She started smoking about 27 years ago. She has a 5 pack-year smoking history. She has never used smokeless tobacco. She reports that she does not drink alcohol and does not use drugs.  Allergies  Allergen Reactions   Fish Allergy Anaphylaxis and Hives   Amaryl [Glimepiride] Itching   Avalide [Irbesartan-Hydrochlorothiazide] Itching   Avandia [Rosiglitazone] Itching   Byetta 10 Mcg Pen [Exenatide] Itching   Codeine Itching    Other reaction(s): Unknown   Demerol [Meperidine Hcl] Itching   Glucovance [Glyburide-Metformin] Itching   Iodinated Contrast Media Itching    Other reaction(s):itching   Lisinopril Itching   Naprosyn [Naproxen] Hives   Penicillins Itching and Other (See Comments)    Has patient had a PCN reaction causing immediate rash, facial/tongue/throat swelling, SOB or lightheadedness with hypotension: no Has patient had a PCN reaction causing severe rash involving mucus membranes or skin necrosis: no Has patient had a PCN reaction that required hospitalization:  yes Has patient had a PCN reaction occurring within the last 10 years: no If all of the above answers are "NO", then may proceed with Cephalosporin use.    Family History  Problem Relation Age of Onset   Heart failure Mother    Osteoporosis Mother    Heart attack Father    Hypertension Father    Diabetes Father    Diabetes Mellitus II Father    Diabetes Sister    Thyroid cancer Sister    Diabetes Brother    Colon cancer Neg Hx    Esophageal cancer Neg Hx    Stomach cancer Neg Hx     Prior to Admission medications   Medication Sig Start Date End Date Taking? Authorizing Provider  abatacept (ORENCIA) 250 MG injection Inject 250 mg into the vein every 30 (thirty) days.    [provider]  buPROPion (WELLBUTRIN XL) 150 MG 24 hr tablet Take 150 mg by mouth every evening. 01/31/20   [provider]  denosumab (PROLIA) 60 MG/ML  SOSY injection Inject 60 mg into the skin every 6 (six) months.    [provider]  diltiazem (TIADYLT ER) 300 MG 24 hr capsule Take 1 capsule (300 mg total) by mouth daily. 11/07/22   Tolia, Sunit, DO  escitalopram (LEXAPRO) 10 MG tablet Take 10 mg by mouth every evening.    [provider]  esomeprazole (NEXIUM) 20 MG capsule Take 40 mg by mouth every evening.    [provider]  folic acid (FOLVITE) 1 MG tablet Take 2 mg by mouth every evening.    [provider]  furosemide (LASIX) 20 MG tablet Take 1 tablet (20 mg total) by mouth daily. 03/17/23 03/16/24  Tolia, Sunit, DO  hydrochlorothiazide (HYDRODIURIL) 25 MG tablet TAKE 1 TABLET BY MOUTH EVERY DAY IN THE MORNING 12/21/22   Tolia, Sunit, DO  HYDROcodone-acetaminophen (NORCO) 10-325 MG tablet Take 1 tablet by mouth every 6 (six) hours as needed for severe pain (post-operatively). 11/19/21   Loletta Parish., MD  hydrocortisone (ANUSOL-HC) 25 MG suppository Place 25 mg rectally 2 (two) times daily as needed for hemorrhoids.     [provider]   hydroxychloroquine (PLAQUENIL) 200 MG tablet Take 200 mg by mouth every evening.    [provider]  levothyroxine (SYNTHROID) 175 MCG tablet Take 175 mcg by mouth every evening. 03/18/21   [provider]  losartan (COZAAR) 50 MG tablet TAKE 1 TABLET BY MOUTH EVERY DAY 12/26/22   Tolia, Sunit, DO  metFORMIN (GLUCOPHAGE-XR) 500 MG 24 hr tablet Take 2,000 mg by mouth every evening.    [provider]  morphine (MS CONTIN) 30 MG 12 hr tablet Take 30 mg by mouth every 12 (twelve) hours. 300 am and 300 pm 07/06/20   [provider]  Multiple Vitamin (MULTIVITAMIN WITH MINERALS) TABS tablet Take 1 tablet by mouth every evening.    [provider]  oxyCODONE-acetaminophen (PERCOCET) 7.5-325 MG tablet Take 1 tablet by mouth every 6 (six) hours as needed for moderate pain (pain score 4-6) or severe pain (pain score 7-10). 02/20/23   [provider]  pravastatin (PRAVACHOL) 40 MG tablet Take 40 mg by mouth every evening.    [provider]  warfarin (COUMADIN) 5 MG tablet TAKE 1 TABLET (5 MG TOTAL) BY MOUTH DAILY. Patient taking differently: Take 2.5 mg by mouth daily. 12/22/22   Tessa Lerner, DO    Physical Exam: Vitals:   04/05/23 1830 04/05/23 1845 04/05/23 1930 04/05/23 2049  BP: 136/86  135/79   Pulse: (!) 120 (!) 121 (!) 118 (!) 110  Resp: (!) 21 15 15 20   Temp:    98.2 F (36.8 C)  TempSrc:      SpO2: 93% 93% 96% 100%  Height:       GENERAL: No apparent distress.  Nontoxic. HEENT: MMM.  Vision and hearing grossly intact.  NECK: Supple.  No apparent JVD.  RESP:  No IWOB.  Fair aeration bilaterally. CVS: Tachycardic to 110s./6 SEM over RUSB and LUSB. ABD/GI/GU: BS+. Abd soft, NTND.  MSK/EXT:   No apparent deformity. Moves extremities.  2+ BLE edema. SKIN: no apparent skin lesion or wound NEURO: Awake and alert. Oriented appropriately.  No apparent focal neuro deficit. PSYCH: Calm. Normal affect.   Data Reviewed: See  HPI  Assessment and Plan: Principal Problem:   Acute on chronic diastolic (congestive) heart failure (HCC) Active Problems:   Rheumatoid arthritis (HCC)   Atrial fibrillation (HCC)   Chronic pain syndrome  DM2 (diabetes mellitus, type 2) (HCC)   HTN (hypertension)   Chronic narcotic use  Acute on chronic diastolic CHF: Presents with cardinal symptoms including dyspnea, DOE, orthopnea, PND and BLE edema.  No improvement despite increasing HCTZ and taking Lasix.  BNP elevated to 1500.  CXR with enlarged cardiac silhouette and abnormal left heart border.  Per EDP, bedside ultrasound without effusion.  Received IV Lasix 60 mg x 1 in ED.  Cardiology consulted. -Continue IV Lasix 40 mg twice daily starting tomorrow morning -Check echocardiogram -Strict intake and output, daily weight, renal functions and electrolytes  Near syncope: Vasovagal?  Reportedly felt like having a bowel movement but felt dizzy and weak and had bowel accident.  She has murmur on exam but chronic.  EKG with sinus tachycardia. -Echocardiogram as above  Elevated troponin/type II myocardial infarction: Likely due to CHF.  No significant delta to suggest ACS.  No chest pain either.  EKG without acute ischemic finding. -Manage CHF as above -Echocardiogram  Paroxysmal A-fib: Currently in sinus rhythm with PAC.  INR subtherapeutic.  Reports compliance with warfarin. -Will do heparin bridge pending cardiology evaluation in case of procedure -Continue home Cardizem after med rec. This might not be a great choice with CHF -IV metoprolol as needed -Optimize electrolytes.  Replenish K and Mg aggressively  Hypokalemia/hypomagnesemia: Could be due to HCTZ and Lasix. -Replenish and recheck in the morning  NIDDM-2 with hyperglycemia: A1c 7.6% in 2023.  On metformin at home -SSI-sensitive -Check hemoglobin A1c  Abnormal chest x-ray: Portable CXR showed abnormal left heart control -Check two-view CXR -Echocardiogram as  above.  Chronic pain syndrome -Resume home meds after med rec  Rheumatoid arthritis: On monthly Orencia injection and Plaquenil. -Continue home Plaquenil  Hypothyroidism -Continue home Synthroid after med rec  Mood disorder: Stable -Continue home med after med rec  Generalized weakness -PT/OT eval    Advance Care Planning:   Code Status: Limited: Do not attempt resuscitation (DNR) -DNR-LIMITED -Do Not Intubate/DNI -confirmed with patient.  Consults: Cardiology  Family Communication: None at bedside  Severity of Illness: The appropriate patient status for this patient is INPATIENT. Inpatient status is judged to be reasonable and necessary in order to provide the required intensity of service to ensure the patient's safety. The patient's presenting symptoms, physical exam findings, and initial radiographic and laboratory data in the context of their chronic comorbidities is felt to place them at high risk for further clinical deterioration. Furthermore, it is not anticipated that the patient will be medically stable for discharge from the hospital within 2 midnights of admission.   * I certify that at the point of admission it is my clinical judgment that the patient will require inpatient hospital care spanning beyond 2 midnights from the point of admission due to high intensity of service, high risk for further deterioration and high frequency of surveillance required.*  Author: Almon Hercules, MD 04/05/2023 9:10 PM  For on call review [email or web address].

## 2023-04-05 NOTE — ED Notes (Addendum)
ED TO INPATIENT HANDOFF REPORT  Name/Age/Gender [name] 72 y.o. female  Code Status Code Status History     Date Active Date Inactive Code Status Order ID Comments User Context   10/27/2021 0237 10/30/2021 2217 Full Code [id number]  Hillary Bow, DO ED   01/19/2018 1451 01/22/2018 1823 Full Code [id number]  Leatha Gilding, MD Inpatient       Home/SNF/Other Home  Chief Complaint Acute on chronic diastolic (congestive) heart failure (HCC) [I50.33]  Level of Care/Admitting Diagnosis ED Disposition     ED Disposition  Admit   Condition  --   Comment  Hospital Area: [facility] [[id number]]  Level of Care: Telemetry [5]  Admit to tele based on following criteria: Acute CHF  May admit patient to [facility] or [facility] if equivalent level of care is available:: No  Covid Evaluation: Asymptomatic - no recent exposure (last 10 days) testing not required  Diagnosis: Acute on chronic diastolic (congestive) heart failure [facility]) [[id number]]  Admitting Physician: Almon Hercules [id number]]  Attending Physician: Almon Hercules [id number]  Certification:: I certify this patient will need inpatient services for at least 2 midnights  Expected Medical Readiness: 04/07/2023          Medical History Past Medical History:  Diagnosis Date   Asthma, mild    Intermittent   Atrial fibrillation (HCC)    Feb 2023   Cancer [facility])    Chronic back pain    Echocardiogram abnormal 09/2008   With mild aortic valve and mitral valve regurgitation   Heart murmur    History of left breast cancer 1999   Hyperlipidemia    Hypertension    Hypothyroidism    IBS (irritable bowel syndrome)    Lactose intolerance    Osteoporosis    Peptic ulcer disease    rheumatoid Arthritis    Type II diabetes mellitus (HCC)    [facility] 08-2012 checks cbg bid   Ulcer, duodenal, acute, with obstruction 1999   Wears glasses     Allergies Allergies  Allergen Reactions   Fish Allergy  Anaphylaxis and Hives   Amaryl [Glimepiride] Itching   Avalide [Irbesartan-Hydrochlorothiazide] Itching   Avandia [Rosiglitazone] Itching   Byetta 10 Mcg Pen [Exenatide] Itching   Codeine Itching    Other reaction(s): Unknown   Demerol [Meperidine Hcl] Itching   Glucovance [Glyburide-Metformin] Itching   Iodinated Contrast Media Itching    Other reaction(s):itching   Lisinopril Itching   Naprosyn [Naproxen] Hives   Penicillins Itching and Other (See Comments)    Has patient had a PCN reaction causing immediate rash, facial/tongue/throat swelling, SOB or lightheadedness with hypotension: no Has patient had a PCN reaction causing severe rash involving mucus membranes or skin necrosis: no Has patient had a PCN reaction that required hospitalization: yes Has patient had a PCN reaction occurring within the last 10 years: no If all of the above answers are "NO", then may proceed with Cephalosporin use.    IV Location/Drains/Wounds Patient Lines/Drains/Airways Status     Active Line/Drains/Airways     Name Placement date Placement time Site Days   Peripheral IV 04/05/23 20 G Anterior;Proximal;Right Forearm 04/05/23  1708  Forearm  less than 1   Ureteral Drain/Stent Right ureter 5 Fr. 11/19/21  1234  Right ureter  502            Labs/Imaging Results for orders placed or performed during the hospital encounter of 04/05/23 (from the past 48 hour(s))  CBC  with Differential/Platelet     Status: Abnormal   Collection Time: 04/05/23  5:03 PM  Result Value Ref Range   WBC 12.5 (H) 4.0 - 10.5 K/uL   RBC 4.20 3.87 - 5.11 MIL/uL   Hemoglobin 9.9 (L) 12.0 - 15.0 g/dL   HCT 78.2 (L) 95.6 - 21.3 %   MCV 83.8 80.0 - 100.0 fL   MCH 23.6 (L) 26.0 - 34.0 pg   MCHC 28.1 (L) 30.0 - 36.0 g/dL   RDW 08.6 (H) 57.8 - 46.9 %   Platelets 330 150 - 400 K/uL   nRBC 0.0 0.0 - 0.2 %   Neutrophils Relative % 88 %   Neutro Abs 10.9 (H) 1.7 - 7.7 K/uL   Lymphocytes Relative 7 %   Lymphs Abs 0.9 0.7 -  4.0 K/uL   Monocytes Relative 5 %   Monocytes Absolute 0.6 0.1 - 1.0 K/uL   Eosinophils Relative 0 %   Eosinophils Absolute 0.0 0.0 - 0.5 K/uL   Basophils Relative 0 %   Basophils Absolute 0.0 0.0 - 0.1 K/uL   Immature Granulocytes 0 %   Abs Immature Granulocytes 0.05 0.00 - 0.07 K/uL    Comment: Performed at [facility], [address], [place], [place] [address]  Comprehensive metabolic panel     Status: Abnormal   Collection Time: 04/05/23  5:03 PM  Result Value Ref Range   Sodium 139 135 - 145 mmol/L   Potassium 3.0 (L) 3.5 - 5.1 mmol/L   Chloride 99 98 - 111 mmol/L   CO2 27 22 - 32 mmol/L   Glucose, Bld 207 (H) 70 - 99 mg/dL    Comment: Glucose reference range applies only to samples taken after fasting for at least 8 hours.   BUN 20 8 - 23 mg/dL   Creatinine, Ser 8.41 0.44 - 1.00 mg/dL   Calcium 8.6 (L) 8.9 - 10.3 mg/dL   Total Protein 6.7 6.5 - 8.1 g/dL   Albumin 3.3 (L) 3.5 - 5.0 g/dL   AST 22 15 - 41 U/L   ALT 18 0 - 44 U/L   Alkaline Phosphatase 60 38 - 126 U/L   Total Bilirubin 0.6 <1.2 mg/dL   GFR, Estimated >32 >44 mL/min    Comment: (NOTE) Calculated using the CKD-EPI Creatinine Equation (2021)    Anion gap 13 5 - 15    Comment: Performed at [facility], [address] [place], [place] [address]  Troponin I (High Sensitivity)     Status: Abnormal   Collection Time: 04/05/23  5:03 PM  Result Value Ref Range   Troponin I (High Sensitivity) 71 (H) <18 ng/L    Comment: (NOTE) Elevated high sensitivity troponin I (hsTnI) values and significant  changes across serial measurements may suggest ACS but many other  chronic and acute conditions are known to elevate hsTnI results.  Refer to the "Links" section for chest pain algorithms and additional  guidance. Performed at [facility], [address], [place], [place] [address]   Brain natriuretic peptide     Status: Abnormal   Collection Time: 04/05/23   5:03 PM  Result Value Ref Range   B Natriuretic Peptide 1,498.3 (H) 0.0 - 100.0 pg/mL    Comment: Performed at [facility], [address], [place], [place] [address]  Protime-INR     Status: Abnormal   Collection Time: 04/05/23  5:03 PM  Result Value Ref Range   Prothrombin Time 16.1 (  H) 11.4 - 15.2 seconds   INR 1.3 (H) 0.8 - 1.2    Comment: (NOTE) INR goal varies based on device and disease states. Performed at [facility], [address], [place], [place] [address]   Urinalysis, w/ Reflex to Culture (Infection Suspected) -Urine, Clean Catch     Status: Abnormal   Collection Time: 04/05/23  5:57 PM  Result Value Ref Range   Specimen Source URINE, CLEAN CATCH    Color, Urine AMBER (A) YELLOW    Comment: BIOCHEMICALS MAY BE AFFECTED BY COLOR   APPearance CLOUDY (A) CLEAR   Specific Gravity, Urine 1.018 1.005 - 1.030   pH 5.0 5.0 - 8.0   Glucose, UA NEGATIVE NEGATIVE mg/dL   Hgb urine dipstick SMALL (A) NEGATIVE   Bilirubin Urine NEGATIVE NEGATIVE   Ketones, ur NEGATIVE NEGATIVE mg/dL   Protein, ur >=119 (A) NEGATIVE mg/dL   Nitrite NEGATIVE NEGATIVE   Leukocytes,Ua LARGE (A) NEGATIVE   RBC / HPF 11-20 0 - 5 RBC/hpf   WBC, UA 21-50 0 - 5 WBC/hpf    Comment:        Reflex urine culture not performed if WBC <=10, OR if Squamous epithelial cells >5. If Squamous epithelial cells >5 suggest recollection.    Bacteria, UA MANY (A) NONE SEEN   Squamous Epithelial / HPF 0-5 0 - 5 /HPF   WBC Clumps PRESENT    Mucus PRESENT     Comment: Performed at [facility], [address] [place], [place] [address]  Troponin I (High Sensitivity)     Status: Abnormal   Collection Time: 04/05/23  7:22 PM  Result Value Ref Range   Troponin I (High Sensitivity) 67 (H) <18 ng/L    Comment: (NOTE) Elevated high sensitivity troponin I (hsTnI) values and significant  changes across serial measurements may suggest ACS but many other   chronic and acute conditions are known to elevate hsTnI results.  Refer to the "Links" section for chest pain algorithms and additional  guidance. Performed at [facility], [address] [place], [place] [address]    DG Chest Port 1 View  Result Date: 04/05/2023 CLINICAL DATA:  c/o dizziness, sob, and weakness on exertion. Per EMS pt felt like she was about to have a bowel movement and went to the restroom. She couldn't go so she stood up and started feeling dizzy. EXAM: PORTABLE CHEST 1 VIEW COMPARISON:  Chest x-ray 05/14/2018 FINDINGS: Marked patient rotation. Enlarged cardiac silhouette with abnormal left heart border. No focal consolidation. No pulmonary edema. No pleural effusion. No pneumothorax. No acute osseous abnormality. IMPRESSION: Enlarged cardiac silhouette with abnormal left heart border. Finding may be due to patient rotation/positioning. Recommend repeat PA and lateral view of the chest for further evaluation. If patient clinically unstable, consider CT chest WITH intravenous contrast (CTA or CTPA if clinically indicated) for further evaluation. Electronically Signed   By: Tish Frederickson M.D.   On: 04/05/2023 18:06    Pending Labs Unresulted Labs (From admission, onward)     Start     Ordered   04/05/23 2018  Magnesium  Add-on,   AD        04/05/23 2017   04/05/23 1757  Urine Culture  Once,   R        04/05/23 1757            Vitals/Pain Today's Vitals   04/05/23 1800 04/05/23 1830 04/05/23 1845 04/05/23 1930  BP: (!) 129/94  136/86  135/79  Pulse: (!) 122 (!) 120 (!) 121 (!) 118  Resp: 20 (!) 21 15 15   Temp:      TempSrc:      SpO2: 94% 93% 93% 96%  Height:      PainSc:        Isolation Precautions No active isolations  Medications Medications  oxyCODONE-acetaminophen (PERCOCET) 7.5-325 MG per tablet 1 tablet (has no administration in time range)  potassium chloride 10 mEq in 100 mL IVPB (10 mEq Intravenous New Bag/Given 04/05/23  2012)  potassium chloride SA (KLOR-CON M) CR tablet 40 mEq (has no administration in time range)  morphine (MS CONTIN) 12 hr tablet 30 mg (30 mg Oral Given 04/05/23 1929)  furosemide (LASIX) injection 60 mg (60 mg Intravenous Given 04/05/23 1929)    Mobility Walks with assistance

## 2023-04-06 ENCOUNTER — Inpatient Hospital Stay (HOSPITAL_COMMUNITY): Payer: Medicare Other

## 2023-04-06 DIAGNOSIS — I350 Nonrheumatic aortic (valve) stenosis: Secondary | ICD-10-CM | POA: Diagnosis not present

## 2023-04-06 DIAGNOSIS — I5033 Acute on chronic diastolic (congestive) heart failure: Secondary | ICD-10-CM | POA: Diagnosis not present

## 2023-04-06 DIAGNOSIS — I5031 Acute diastolic (congestive) heart failure: Secondary | ICD-10-CM

## 2023-04-06 DIAGNOSIS — I052 Rheumatic mitral stenosis with insufficiency: Secondary | ICD-10-CM | POA: Insufficient documentation

## 2023-04-06 DIAGNOSIS — I059 Rheumatic mitral valve disease, unspecified: Secondary | ICD-10-CM | POA: Insufficient documentation

## 2023-04-06 DIAGNOSIS — E119 Type 2 diabetes mellitus without complications: Secondary | ICD-10-CM | POA: Insufficient documentation

## 2023-04-06 DIAGNOSIS — Z7901 Long term (current) use of anticoagulants: Secondary | ICD-10-CM | POA: Insufficient documentation

## 2023-04-06 LAB — ECHOCARDIOGRAM COMPLETE
AR max vel: 0.78 cm2
AV Area VTI: 0.78 cm2
AV Area mean vel: 0.71 cm2
AV Mean grad: 16 mm[Hg]
AV Peak grad: 29.3 mm[Hg]
Ao pk vel: 2.71 m/s
Area-P 1/2: 6.65 cm2
Height: 63 in
MV M vel: 5.88 m/s
MV Peak grad: 138.1 mm[Hg]
MV VTI: 0.53 cm2
P 1/2 time: 304 ms
S' Lateral: 2.13 cm
Weight: 1887.14 [oz_av]

## 2023-04-06 LAB — RENAL FUNCTION PANEL
Albumin: 3.2 g/dL — ABNORMAL LOW (ref 3.5–5.0)
Anion gap: 12 (ref 5–15)
BUN: 16 mg/dL (ref 8–23)
CO2: 27 mmol/L (ref 22–32)
Calcium: 8.6 mg/dL — ABNORMAL LOW (ref 8.9–10.3)
Chloride: 98 mmol/L (ref 98–111)
Creatinine, Ser: 0.64 mg/dL (ref 0.44–1.00)
GFR, Estimated: >60 mL/min (ref >60)
Glucose, Bld: 155 mg/dL — ABNORMAL HIGH (ref 70–99)
Phosphorus: 4.6 mg/dL (ref 2.5–4.6)
Potassium: 3.5 mmol/L (ref 3.5–5.1)
Sodium: 137 mmol/L (ref 135–145)

## 2023-04-06 LAB — GLUCOSE, CAPILLARY
Glucose-Capillary: 135 mg/dL — ABNORMAL HIGH (ref 70–99)
Glucose-Capillary: 151 mg/dL — ABNORMAL HIGH (ref 70–99)
Glucose-Capillary: 165 mg/dL — ABNORMAL HIGH (ref 70–99)
Glucose-Capillary: 162 mg/dL — ABNORMAL HIGH (ref 70–99)

## 2023-04-06 LAB — HEPARIN LEVEL (UNFRACTIONATED)
Heparin Unfractionated: 0.21 [IU]/mL — ABNORMAL LOW (ref 0.30–0.70)
Heparin Unfractionated: 0.49 [IU]/mL (ref 0.30–0.70)

## 2023-04-06 LAB — CBC
HCT: 32.9 % — ABNORMAL LOW (ref 36.0–46.0)
Hemoglobin: 9.3 g/dL — ABNORMAL LOW (ref 12.0–15.0)
MCH: 23.5 pg — ABNORMAL LOW (ref 26.0–34.0)
MCHC: 28.3 g/dL — ABNORMAL LOW (ref 30.0–36.0)
MCV: 83.3 fL (ref 80.0–100.0)
Platelets: 263 10*3/uL (ref 150–400)
RBC: 3.95 MIL/uL (ref 3.87–5.11)
RDW: 17.3 % — ABNORMAL HIGH (ref 11.5–15.5)
WBC: 9.9 10*3/uL (ref 4.0–10.5)
nRBC: 0.2 % (ref 0.0–0.2)

## 2023-04-06 LAB — MAGNESIUM: Magnesium: 1.9 mg/dL (ref 1.7–2.4)

## 2023-04-06 LAB — TSH: TSH: 2.2 u[IU]/mL (ref 0.350–4.500)

## 2023-04-06 LAB — HEMOGLOBIN A1C
Hgb A1c MFr Bld: 9 % — ABNORMAL HIGH (ref 4.8–5.6)
Mean Plasma Glucose: 211.6 mg/dL

## 2023-04-06 MED ORDER — HYDROCORTISONE ACETATE 25 MG RE SUPP
25.0000 mg | Freq: Two times a day (BID) | RECTAL | Status: DC | PRN
  Filled 2023-04-06: qty 1

## 2023-04-06 MED ORDER — HYDROXYCHLOROQUINE SULFATE 200 MG PO TABS
200.0000 mg | ORAL_TABLET | Freq: Every evening | ORAL | Status: DC
  Administered 2023-04-06 – 2023-04-12 (×5): 200 mg via ORAL
  Filled 2023-04-06 (×8): qty 1

## 2023-04-06 MED ORDER — MORPHINE SULFATE ER 30 MG PO TBCR
30.0000 mg | EXTENDED_RELEASE_TABLET | Freq: Two times a day (BID) | ORAL | Status: DC
  Administered 2023-04-06 – 2023-04-14 (×17): 30 mg via ORAL
  Filled 2023-04-06 (×18): qty 1

## 2023-04-06 MED ORDER — DILTIAZEM HCL ER COATED BEADS 300 MG PO CP24
300.0000 mg | ORAL_CAPSULE | Freq: Every day | ORAL | Status: DC
  Administered 2023-04-06 – 2023-04-14 (×9): 300 mg via ORAL
  Filled 2023-04-06 (×9): qty 1

## 2023-04-06 MED ORDER — FOLIC ACID 1 MG PO TABS
2.0000 mg | ORAL_TABLET | Freq: Every evening | ORAL | Status: DC
  Administered 2023-04-06 – 2023-04-12 (×5): 2 mg via ORAL
  Filled 2023-04-06 (×6): qty 2

## 2023-04-06 MED ORDER — ESCITALOPRAM OXALATE 10 MG PO TABS
10.0000 mg | ORAL_TABLET | Freq: Every evening | ORAL | Status: DC
  Administered 2023-04-06 – 2023-04-12 (×5): 10 mg via ORAL
  Filled 2023-04-06 (×6): qty 1

## 2023-04-06 MED ORDER — BUPROPION HCL ER (XL) 150 MG PO TB24
150.0000 mg | ORAL_TABLET | Freq: Every evening | ORAL | Status: DC
  Administered 2023-04-06 – 2023-04-13 (×6): 150 mg via ORAL
  Filled 2023-04-06 (×7): qty 1

## 2023-04-06 MED ORDER — METFORMIN HCL ER 750 MG PO TB24
2000.0000 mg | ORAL_TABLET | Freq: Every evening | ORAL | Status: DC
  Administered 2023-04-06 – 2023-04-07 (×2): 2000 mg via ORAL
  Filled 2023-04-06 (×3): qty 1

## 2023-04-06 MED ORDER — LEVOTHYROXINE SODIUM 75 MCG PO TABS
175.0000 ug | ORAL_TABLET | Freq: Every evening | ORAL | Status: DC
  Administered 2023-04-06 – 2023-04-13 (×6): 175 ug via ORAL
  Filled 2023-04-06 (×7): qty 1

## 2023-04-06 NOTE — Plan of Care (Signed)
  Problem: Education: Goal: Knowledge of General Education information will improve Description: Including pain rating scale, medication(s)/side effects and non-pharmacologic comfort measures Outcome: Progressing   Problem: Clinical Measurements: Goal: Respiratory complications will improve Outcome: Progressing   Problem: Nutrition: Goal: Adequate nutrition will be maintained Outcome: Progressing   Problem: Coping: Goal: Level of anxiety will decrease Outcome: Progressing   Problem: Pain Management: Goal: General experience of comfort will improve Outcome: Progressing   Problem: Safety: Goal: Ability to remain free from injury will improve Outcome: Progressing

## 2023-04-06 NOTE — TOC CM/SW Note (Signed)
Transition of Care [facility]) - Inpatient Brief Assessment   Patient Details  Name: [name] MRN: [id number] Date of Birth: Sep 27, 1950  Transition of Care [facility]) CM/SW Contact:    Larrie Kass, LCSW Phone Number: 04/06/2023, 2:11 PM      Transition of Care Asessment: Insurance and Status: Insurance coverage has been reviewed Patient has primary care physician: Yes Home environment has been reviewed: home with self Prior level of function:: independent Prior/Current Home Services: No current home services Social Determinants of Health Reivew: SDOH reviewed no interventions necessary Readmission risk has been reviewed: Yes Transition of care needs: no transition of care needs at this time

## 2023-04-06 NOTE — Evaluation (Signed)
Occupational Therapy Evaluation and Discharge Patient Details Name: [name] MRN: [id number] DOB: 07-14-1950 Today's Date: 04/06/2023   History of Present Illness Pt is a 72 year old woman admitted on 12/4 with near syncope, weakness and worsening LE edema. + CHF exacerbation and suspected vasovagal episode. PMH: d CHF, DM2, afib, RA, chronic pain, HTN, osteoporosis, hypothyroidism.breast ca.   Clinical Impression   Pt is independent, lives alone and drives. She has PRN assist of her sons and daughter in law. Pt ambulated to the bathroom and completed ADLs independently with OT managing IV pole. She does have decreased vision and needs large print. She was scheduled for cataract surgery today and also has retinal disease. Reinforced daily weights, sodium reduced diet and adhering to fluid restrictions. No further OT needs. Recommend ADLs with nursing staff.       If plan is discharge home, recommend the following:      Functional Status Assessment  Patient has not had a recent decline in their functional status  Equipment Recommendations  None recommended by OT    Recommendations for Other Services       Precautions / Restrictions Precautions Precautions: None Restrictions Weight Bearing Restrictions: No      Mobility Bed Mobility Overal bed mobility: Modified Independent                  Transfers Overall transfer level: Independent Equipment used: None                      Balance Overall balance assessment: Needs assistance   Sitting balance-Leahy Scale: Normal       Standing balance-Leahy Scale: Good                             ADL either performed or assessed with clinical judgement   ADL Overall ADL's : Modified independent                                             Vision Baseline Vision/History: 1 Wears glasses Ability to See in Adequate Light: 1 Impaired Patient Visual Report: No change from  baseline Additional Comments: pt was supposed to have cataract sx today, needs large print     Perception         Praxis         Pertinent Vitals/Pain Pain Assessment Pain Assessment: Faces Faces Pain Scale: No hurt     Extremity/Trunk Assessment Upper Extremity Assessment Upper Extremity Assessment: Overall WFL for tasks assessed   Lower Extremity Assessment Lower Extremity Assessment: Defer to PT evaluation   Cervical / Trunk Assessment Cervical / Trunk Assessment: Kyphotic   Communication Communication Communication: No apparent difficulties   Cognition Arousal: Alert Behavior During Therapy: WFL for tasks assessed/performed Overall Cognitive Status: Within Functional Limits for tasks assessed                                 General Comments: fiesty     General Comments       Exercises     Shoulder Instructions      Home Living Family/patient expects to be discharged to:: Private residence Living Arrangements: Alone Available Help at Discharge: Family;Available PRN/intermittently Type of Home: Mobile home Home Access: Stairs to  enter Entrance Stairs-Number of Steps: 3   Home Layout: One level     Bathroom Shower/Tub: Producer, television/film/video: Standard     Home Equipment: None          Prior Functioning/Environment Prior Level of Function : Independent/Modified Independent;Driving             Mobility Comments: walks without AD          OT Problem List:        OT Treatment/Interventions:      OT Goals(Current goals can be found in the care plan section)    OT Frequency:      Co-evaluation              AM-PAC OT "6 Clicks" Daily Activity     Outcome Measure Help from another person eating meals?: None Help from another person taking care of personal grooming?: None Help from another person toileting, which includes using toliet, bedpan, or urinal?: None Help from another person bathing (including  washing, rinsing, drying)?: None Help from another person to put on and taking off regular upper body clothing?: None Help from another person to put on and taking off regular lower body clothing?: None 6 Click Score: 24   End of Session    Activity Tolerance: Patient tolerated treatment well Patient left: in chair;with call bell/phone within reach;with nursing/sitter in room  OT Visit Diagnosis: Muscle weakness (generalized) (M62.81)                Time: 3710-6269 OT Time Calculation (min): 16 min Charges:  OT General Charges $OT Visit: 1 Visit OT Evaluation $OT Eval Low Complexity: 1 Low  Berna Spare, OTR/L Acute Rehabilitation Services Office: [phone]  Evern Bio 04/06/2023, 10:14 AM

## 2023-04-06 NOTE — Progress Notes (Signed)
PHARMACY - ANTICOAGULATION CONSULT NOTE  Pharmacy Consult for heparin Indication: atrial fibrillation  Allergies  Allergen Reactions   Fish Allergy Anaphylaxis and Hives   Amaryl [Glimepiride] Itching   Avalide [Irbesartan-Hydrochlorothiazide] Itching   Avandia [Rosiglitazone] Itching   Byetta 10 Mcg Pen [Exenatide] Itching   Codeine Itching    Other reaction(s): Unknown   Demerol [Meperidine Hcl] Itching   Glucovance [Glyburide-Metformin] Itching   Iodinated Contrast Media Itching    Other reaction(s):itching   Lisinopril Itching   Naprosyn [Naproxen] Hives   Penicillins Itching and Other (See Comments)    Has patient had a PCN reaction causing immediate rash, facial/tongue/throat swelling, SOB or lightheadedness with hypotension: no Has patient had a PCN reaction causing severe rash involving mucus membranes or skin necrosis: no Has patient had a PCN reaction that required hospitalization: yes Has patient had a PCN reaction occurring within the last 10 years: no If all of the above answers are "NO", then may proceed with Cephalosporin use.    Patient Measurements: Height: 5\' 3"  (160 cm) Weight: 57.1 kg (125 lb 14.1 oz) IBW/kg (Calculated) : 52.4 Heparin Dosing Weight: 57.1 kg  Vital Signs: Temp: 98.6 F (37 C) (12/05 2009) Temp Source: Oral (12/05 2009) BP: 134/70 (12/05 2009) Pulse Rate: 90 (12/05 2009)  Labs: Recent Labs    04/05/23 1703 04/05/23 1922 04/06/23 0425 04/06/23 1020 04/06/23 2103  HGB 9.9*  --  9.3*  --   --   HCT 35.2*  --  32.9*  --   --   PLT 330  --  263  --   --   LABPROT 16.1*  --   --   --   --   INR 1.3*  --   --   --   --   HEPARINUNFRC  --   --   --  0.21* 0.49  CREATININE 0.84  --  0.64  --   --   TROPONINIHS 71* 67*  --   --   --     Estimated Creatinine Clearance: 51.8 mL/min (by C-G formula based on SCr of 0.64 mg/dL).   Medical History: Past Medical History:  Diagnosis Date   Asthma, mild    Intermittent   Atrial  fibrillation [facility])    Feb 2023   Cancer [facility])    Chronic back pain    Echocardiogram abnormal 09/2008   With mild aortic valve and mitral valve regurgitation   Heart murmur    History of left breast cancer 1999   Hyperlipidemia    Hypertension    Hypothyroidism    IBS (irritable bowel syndrome)    Lactose intolerance    Osteoporosis    Peptic ulcer disease    rheumatoid Arthritis    Type II diabetes mellitus (HCC)    [facility] 08-2012 checks cbg bid   Ulcer, duodenal, acute, with obstruction 1999   Wears glasses     Medications: Warfarin PTA - currently on hold  Assessment: 72 year old female with history of atrial fibrillation on warfarin PTA. INR subtherapeutic on admission. Warfarin to be held pending rule out need for procedure. Pharmacy consulted for heparin management.  Today, 04/06/23: Heparin level 0.49 - therapeutic on heparin @950  units/hr Hgb 9.3, plts 263 - stable  No s/sx of bleeding or infusion related concerns reported by RN  Goal of Therapy:  Heparin level 0.3-0.7 units/ml Monitor platelets by anticoagulation protocol: Yes   Plan:  -Continue heparin infusion at 950 units/hr -Check confirmatory heparin level with  morning labs -Daily CBC -Follow up appropriate restart of warfarin  Junita Push, PharmD, BCPS 12/5/202410:40 PM

## 2023-04-06 NOTE — Consult Note (Incomplete)
Advanced Heart Failure Team Consult Note   Primary Physician: Noberto Retort, MD PCP-Cardiologist:  Tessa Lerner, DO  Reason for Consultation: HFpEF w/ RV failure  HPI:    [name] is seen today for evaluation of HFpEF with RV failure/valvular heart disease at the request of [facility]. 72 y.o. female with history of PAF on warfarin, mitral stenosis/regurgitation, aortic valve stenosis, DM II, hx breast cancer twice (initial event 1990s left lumpectomy/chemo/radiation, recurrence 2000s left mastectomy), RA on plaquenil and Orencia, former smoker.   Patient developed atrial fibrillation in 2023, underwent DCCV.  Echo 4/23 showed EF 55-60%, mild LVH, LV small in size, severe LAE, moderate AI/AS with mean gradient of 15 mmHg and AVA of 1.0 cm2, severely calcified mitral valve with moderate MS and severe MR, moderate TR, RVSP 44 mmHg.  She was managed medically at that time.   She called her Cardiologist's (Dr. Odis Hollingshead) office on 03/16/23 with concerns about worsening lower extremity edema and dyspnea and was started on 20 mg po lasix daily.   Patient presented to the [facility] ED on 04/05/23 with complaints of worsening lower extremity edema, dyspnea for 2-3 weeks + episode of near syncope. Reported no improvement in symptoms after recent addition of diuretic. Rhythm was sinus tachycardia 110s-120s. Labs significant for Scr 0.84, K 3.0, Na 139, CO2 27, HS troponin 71>67, BNP 1498, INR 1.3, UA with bacteria/WBCs/leukocytes, now urine culture growing E coli. She was given 60 mg lasix IV and admitted under hospitalist service for management of acute on chronic CHF.   I reviewed echo this admission, on my read it showed EF vigorous 65-70% with underfilled LV, LV intracavitary gradient peak 21 mmHg, moderate LVH, grade III DD, interventricular septum flattened in systole and diastole c/w RV pressure and volume overlad, RV mildly reduced systolic function and mildly dilated, RVSP 78 mmHg,  severe BAE, moderate MR, severe MS with mean gradient of 21 mmHg, moderate-severe low flow low gradient AS with mean gradient of 19 mmHg and AVA 0.78 cm2, mild-moderate AI, moderate TR.   Advanced Heart Failure asked to see to assist with management of HFpEF/RV failure in setting of valvular disease.  Home Medications Prior to Admission medications   Medication Sig Start Date End Date Taking? Authorizing Provider  abatacept (ORENCIA) 250 MG injection Inject 250 mg into the vein every 30 (thirty) days.   Yes [provider]  buPROPion (WELLBUTRIN XL) 150 MG 24 hr tablet Take 150 mg by mouth every evening. 01/31/20  Yes [provider]  denosumab (PROLIA) 60 MG/ML SOSY injection Inject 60 mg into the skin every 6 (six) months.   Yes [provider]  diltiazem (TIADYLT ER) 300 MG 24 hr capsule Take 1 capsule (300 mg total) by mouth daily. 11/07/22  Yes Tolia, Sunit, DO  escitalopram (LEXAPRO) 10 MG tablet Take 10 mg by mouth every evening.   Yes [provider]  esomeprazole (NEXIUM) 20 MG capsule Take 40 mg by mouth every evening.   Yes [provider]  folic acid (FOLVITE) 1 MG tablet Take 2 mg by mouth every evening.   Yes [provider]  hydrochlorothiazide (HYDRODIURIL) 25 MG tablet TAKE 1 TABLET BY MOUTH EVERY DAY IN THE MORNING 12/21/22  Yes Tolia, Sunit, DO  HYDROcodone-acetaminophen (NORCO) 10-325 MG tablet Take 1 tablet by mouth every 6 (six) hours as needed for severe pain (post-operatively). 11/19/21  Yes Manny, Delbert Phenix., MD  hydrocortisone (ANUSOL-HC) 25 MG suppository Place 25  mg rectally 2 (two) times daily as needed for hemorrhoids.    Yes [provider]  hydroxychloroquine (PLAQUENIL) 200 MG tablet Take 200 mg by mouth every evening.   Yes [provider]  levothyroxine (SYNTHROID) 175 MCG tablet Take 175 mcg by mouth every evening. 03/18/21  Yes [provider]  losartan (COZAAR) 50 MG tablet TAKE 1  TABLET BY MOUTH EVERY DAY 12/26/22  Yes Tolia, Sunit, DO  metFORMIN (GLUCOPHAGE-XR) 500 MG 24 hr tablet Take 2,000 mg by mouth every evening.   Yes [provider]  morphine (MS CONTIN) 30 MG 12 hr tablet Take 30 mg by mouth every 12 (twelve) hours. 300 am and 300 pm 07/06/20  Yes [provider]  Multiple Vitamin (MULTIVITAMIN WITH MINERALS) TABS tablet Take 1 tablet by mouth daily.   Yes [provider]  pravastatin (PRAVACHOL) 40 MG tablet Take 40 mg by mouth every evening.   Yes [provider]  warfarin (COUMADIN) 5 MG tablet TAKE 1 TABLET (5 MG TOTAL) BY MOUTH DAILY. Patient taking differently: Take 2.5 mg by mouth daily. 12/22/22  Yes Tolia, Sunit, DO  furosemide (LASIX) 20 MG tablet Take 1 tablet (20 mg total) by mouth daily. Patient not taking: Reported on 04/05/2023 03/17/23 03/16/24  Tessa Lerner, DO    Past Medical History: 1. Type 2 diabetes 2. Atrial fibrillation: Paroxysmal, DCCV in 2023.  3. Breast cancer: On left.  In 1990s had left lumpectomy, radiation, and chemotherapy.  Recurrence in 2000s, had mastectomy on left.  4. Hypothyroidism 5. Rheumatoid arthritis 6. HFpEF with RV failure: In setting of significant valvular heart disease. Echo (12/24) with EF vigorous 65-70% with underfilled LV, LV intracavitary gradient peak 21 mmHg, moderate LVH, grade III DD, interventricular septum flattened in systole and diastole c/w RV pressure and volume overlad, RV mildly reduced systolic function and mildly dilated, RVSP 78 mmHg, severe BAE, moderate MR, severe MS with mean gradient of 21 mmHg, moderate-severe low flow low gradient AS with mean gradient of 19 mmHg and AVA 0.78 cm2, mild-moderate AI, moderate TR.  7. Mitral valve disorder: 12/24 echo with severe MS, moderate MR.  ?Rheumatic vs due to prior radiation.  8. Aortic valve disorder: 12/24 echo with low flow/low gradient moderate-severe AS and mild-moderate AI.  ?Rheumatic vs due to prior radiation.   9. Prior smoker  Past Surgical History: Past Surgical History:  Procedure Laterality Date   ABDOMINAL HYSTERECTOMY     BREAST IMPLANT REMOVAL     Breast implant infected removed implant 06/06 infected and removed 11/05   BREAST LUMPECTOMY Left 12/1997   Left breast   BREAST RECONSTRUCTION     CARDIOVERSION N/A 08/11/2021   Procedure: CARDIOVERSION;  Surgeon: Tessa Lerner, DO;  Location: [facility];  Service: Cardiovascular;  Laterality: N/A;   CHOLECYSTECTOMY     CYSTOSCOPY W/ URETERAL STENT PLACEMENT Left 01/19/2018   Procedure: CYSTOSCOPY WITH RETROGRADE PYELOGRAM/URETERAL STENT PLACEMENT;  Surgeon: Marcine Matar, MD;  Location: [facility];  Service: Urology;  Laterality: Left;   CYSTOSCOPY W/ URETERAL STENT PLACEMENT Right 10/27/2021   Procedure: CYSTOSCOPY WITH RETROGRADE PYELOGRAM/URETERAL STENT PLACEMENT;  Surgeon: Sebastian Ache, MD;  Location: [facility] OR;  Service: Urology;  Laterality: Right;   CYSTOSCOPY WITH RETROGRADE PYELOGRAM, URETEROSCOPY AND STENT PLACEMENT Right 11/19/2021   Procedure: CYSTOSCOPY WITH RETROGRADE PYELOGRAM, URETEROSCOPY AND STENT EXCHANGE;  Surgeon: Sebastian Ache, MD;  Location: [facility];  Service: Urology;  Laterality: Right;  1 HR   CYSTOSCOPY WITH STENT PLACEMENT  10/27/2021  cone   EXTRACORPOREAL SHOCK WAVE LITHOTRIPSY Left 02/15/2018   Procedure: LEFT EXTRACORPOREAL SHOCK WAVE LITHOTRIPSY (ESWL);  Surgeon: Malen Gauze, MD;  Location: [facility];  Service: Urology;  Laterality: Left;   HOLMIUM LASER APPLICATION Right 11/19/2021   Procedure: HOLMIUM LASER APPLICATION;  Surgeon: Sebastian Ache, MD;  Location: [facility];  Service: Urology;  Laterality: Right;   MASTECTOMY  07/2003   Left   other     Left Arm FX   OTHER SURGICAL HISTORY     Hysterectomy- Ovaries intact    OTHER SURGICAL HISTORY     Fracture Left HIp pinning sx done   PARTIAL GASTRECTOMY  1999   due to ulcers, earlier sx done on stomach also    Family History: Family History   Problem Relation Age of Onset   Heart failure Mother    Osteoporosis Mother    Heart attack Father    Hypertension Father    Diabetes Father    Diabetes Mellitus II Father    Diabetes Sister    Thyroid cancer Sister    Diabetes Brother    Colon cancer Neg Hx    Esophageal cancer Neg Hx    Stomach cancer Neg Hx     Social History: Social History   Socioeconomic History   Marital status: Single    Spouse name: Not on file   Number of children: 2   Years of education: Not on file   Highest education level: Not on file  Occupational History   Not on file  Tobacco Use   Smoking status: Former    Current packs/day: 0.00    Average packs/day: 1 pack/day for 5.0 years (5.0 ttl pk-yrs)    Types: Cigarettes    Start date: 05/13/1995    Quit date: 05/12/2000    Years since quitting: 22.9   Smokeless tobacco: Never  Vaping Use   Vaping status: Never Used  Substance and Sexual Activity   Alcohol use: No   Drug use: No   Sexual activity: Not on file  Other Topics Concern   Not on file  Social History Narrative   Not on file   Social Determinants of Health   Financial Resource Strain: Not on file  Food Insecurity: No Food Insecurity (04/05/2023)   Hunger Vital Sign    Worried About Running Out of Food in the Last Year: Never true    Ran Out of Food in the Last Year: Never true  Transportation Needs: No Transportation Needs (04/05/2023)   PRAPARE - Administrator, Civil Service (Medical): No    Lack of Transportation (Non-Medical): No  Physical Activity: Not on file  Stress: Not on file  Social Connections: Not on file    Allergies:  Allergies  Allergen Reactions   Fish Allergy Anaphylaxis and Hives   Amaryl [Glimepiride] Itching   Avalide [Irbesartan-Hydrochlorothiazide] Itching   Avandia [Rosiglitazone] Itching   Byetta 10 Mcg Pen [Exenatide] Itching   Codeine Itching    Other reaction(s): Unknown   Demerol [Meperidine Hcl] Itching   Glucovance  [Glyburide-Metformin] Itching   Iodinated Contrast Media Itching    Other reaction(s):itching   Lisinopril Itching   Naprosyn [Naproxen] Hives   Penicillins Itching and Other (See Comments)    Has patient had a PCN reaction causing immediate rash, facial/tongue/throat swelling, SOB or lightheadedness with hypotension: no Has patient had a PCN reaction causing severe rash involving mucus membranes or skin necrosis: no Has patient had a PCN reaction that  required hospitalization: yes Has patient had a PCN reaction occurring within the last 10 years: no If all of the above answers are "NO", then may proceed with Cephalosporin use.    Objective:    Vital Signs:   Temp:  [97.5 F (36.4 C)-98.6 F (37 C)] 97.5 F (36.4 C) (12/06 1157) Pulse Rate:  [90-96] 93 (12/06 1157) Resp:  [16-20] 20 (12/06 1157) BP: (126-135)/(70-87) 126/73 (12/06 1157) SpO2:  [93 %-94 %] 94 % (12/06 1157) Weight:  [53.3 kg] 53.3 kg (12/06 0500) Last BM Date : 04/06/23  Weight change: Filed Weights   04/06/23 0500 04/06/23 1014 04/07/23 0500  Weight: 53.5 kg 57.1 kg 53.3 kg    Intake/Output:   Intake/Output Summary (Last 24 hours) at 04/07/2023 1459 Last data filed at 04/07/2023 0820 Gross per 24 hour  Intake 990.24 ml  Output 950 ml  Net 40.24 ml      Physical Exam    General:  Well appearing. No resp difficulty HEENT: normal Neck: supple. JVP 14 cm. Carotids 2+ bilat; no bruits. No lymphadenopathy or thyromegaly appreciated. Cor: PMI nondisplaced. Regular rate & rhythm. 3/6 SEM RUSB, S2 is clearly heard.  Lungs: clear Abdomen: soft, nontender, nondistended. No hepatosplenomegaly. No bruits or masses. Good bowel sounds. Extremities: no cyanosis, clubbing, rash. 2+ edema to knees.  Neuro: alert & orientedx3, cranial nerves grossly intact. moves all 4 extremities w/o difficulty. Affect pleasant   Telemetry   NSR 90s (personally reviewed)  EKG    NSR, right axis deviation (personally  reviewed)  Labs   Basic Metabolic Panel: Recent Labs  Lab 04/05/23 1703 04/05/23 1922 04/06/23 0425 04/07/23 0432  NA 139  --  137 136  K 3.0*  --  3.5 3.6  CL 99  --  98 95*  CO2 27  --  27 25  GLUCOSE 207*  --  155* 159*  BUN 20  --  16 24*  CREATININE 0.84  --  0.64 0.68  CALCIUM 8.6*  --  8.6* 8.5*  MG  --  1.2* 1.9  --   PHOS  --   --  4.6  --     Liver Function Tests: Recent Labs  Lab 04/05/23 1703 04/06/23 0425  AST 22  --   ALT 18  --   ALKPHOS 60  --   BILITOT 0.6  --   PROT 6.7  --   ALBUMIN 3.3* 3.2*   No results for input(s): "LIPASE", "AMYLASE" in the last 168 hours. No results for input(s): "AMMONIA" in the last 168 hours.  CBC: Recent Labs  Lab 04/05/23 1703 04/06/23 0425 04/07/23 0432  WBC 12.5* 9.9 8.5  NEUTROABS 10.9*  --   --   HGB 9.9* 9.3* 9.3*  HCT 35.2* 32.9* 32.9*  MCV 83.8 83.3 83.1  PLT 330 263 254    Cardiac Enzymes: No results for input(s): "CKTOTAL", "CKMB", "CKMBINDEX", "TROPONINI" in the last 168 hours.  BNP: BNP (last 3 results) Recent Labs    04/05/23 1703  BNP 1,498.3*    ProBNP (last 3 results) No results for input(s): "PROBNP" in the last 8760 hours.   CBG: Recent Labs  Lab 04/06/23 1249 04/06/23 1634 04/06/23 2048 04/07/23 0733 04/07/23 1100  GLUCAP 151* 165* 162* 155* 128*    Coagulation Studies: Recent Labs    04/05/23 1703  LABPROT 16.1*  INR 1.3*     Imaging   No results found.   Medications:     Current Medications:  buPROPion  150 mg Oral QPM   diltiazem  300 mg Oral Daily   escitalopram  10 mg Oral QPM   folic acid  2 mg Oral QPM   furosemide  40 mg Intravenous Q12H   hydroxychloroquine  200 mg Oral QPM   insulin aspart  0-9 Units Subcutaneous TID WC   levothyroxine  175 mcg Oral QPM   metFORMIN  2,000 mg Oral QPM   morphine  30 mg Oral Q12H   pravastatin  40 mg Oral QPM   sodium chloride flush  3 mL Intravenous Q12H    Infusions:  heparin 950 Units/hr (04/07/23  0258)      Patient Profile   72 y.o. female with history of PAF on warfarin, mitral stenosis/regurgitation, aortic valve stenosis, DM II, hx breast cancer x 2 (initial event lumpectomy/chemo/radiation, recurrence left mastectomy), DM II, RA on plaquenil and Orencia, former smoker, admitted w/ acute HFpEF. Echo w/ normal LVEF and evidence of RV failure in setting of severe MS/MR and mod-severe AS. Transferred from [facility] for AHF and CT surgical team consults.   Assessment/Plan   1. Acute HFpEF w/prominent RV dysfunction and valvular heart disease: I reviewed this admission's echo, on my read it showed EF vigorous 65-70% with underfilled LV, LV intracavitary gradient peak 21 mmHg, moderate LVH, grade III DD, interventricular septum flattened in systole and diastole c/w RV pressure and volume overlad, RV mildly reduced systolic function and mildly dilated, RVSP 78 mmHg, severe BAE, moderate MR, severe MS with mean gradient of 21 mmHg, moderate-severe paradoxical low flow low gradient AS with mean gradient of 19 mmHg and AVA 0.78 cm2, mild-moderate AI, moderate TR.  Echo from 4/23 also showed valvular disease.  She is volume overloaded on exam with NYHA class IIIb symptoms at home. Needs full diuresis followed by RHC/LHC.  - Increase Lasix to 80 mg IV bid.  - Add spironolactone 12.5 daily.  - No SGLT2 inhibitor with UTI.  3. Valvular heart disease: She has severe mitral stenosis with moderate MR and a heavily calcified MV.  There is moderate-severe paradoxical low flow/low gradient AS with mean gradient 19 mmHg, AVA 0.79 cm^2.  She also has moderate TR.  Etiology is possibly rheumatic versus due to prior left-sided radiation for breast cancer in 1990s versus a combination. Her valvular disease is quite severe.  - She will need TEE to more closely assess the valves, ?Monday.  - She will need left/right heart catheterization after volume optimization.  - She will need TCTS consult after the above.   Ideally would have MV and aortic valve replacements, tricuspid valve repair, and Maze with LA appendage clipping.  - If she has cardiac surgery, I can foresee potential problems with her hyperdynamic LV leading to underfilling and hypotension post-operatively.  Will continue to use diltiazem CD and will add Toprol XL 25 mg daily to try to decrease contractility.  - if deemed too high risk for surgery will need to consider palliative care   3. Atrial fibrillation: Paroxysmal.  In setting of severe mitral valve disease (stenosis/regurgitation).  Severe biatrial enlargement on echo.  She is in NSR currently.  - Heparin gtt for now, was on warfarin at home.  - If planned for surgery, would start amiodarone pre-op given high risk for post-op atrial fibrillation.  4. RA: On Orencia/Plaquenil  5. Hypothyroidism - on Synthroid 175  6. Type 2DM:  Poorly controlled, Hgb A1c 9.0.  - Will stop metformin for now.  - SSI  - No SGLT2  inhibitor with UTI.  7. UTI: Urine culture has grown E coli.  She is allergic to PCNs, probably not cephalosporin candidate.  - Start Cipro 250 mg bid pending sensitivities.   Length of Stay: 2  Robbie Lis, PA-C  04/07/2023, 2:59 PM  Advanced Heart Failure Team Pager [phone] (M-F; 7a - 5p)  Please contact [facility] for night-coverage after hours (4p -7a ) and weekends on [email or web address]

## 2023-04-06 NOTE — Progress Notes (Signed)
PHARMACY - ANTICOAGULATION CONSULT NOTE  Pharmacy Consult for heparin Indication: atrial fibrillation  Allergies  Allergen Reactions   Fish Allergy Anaphylaxis and Hives   Amaryl [Glimepiride] Itching   Avalide [Irbesartan-Hydrochlorothiazide] Itching   Avandia [Rosiglitazone] Itching   Byetta 10 Mcg Pen [Exenatide] Itching   Codeine Itching    Other reaction(s): Unknown   Demerol [Meperidine Hcl] Itching   Glucovance [Glyburide-Metformin] Itching   Iodinated Contrast Media Itching    Other reaction(s):itching   Lisinopril Itching   Naprosyn [Naproxen] Hives   Penicillins Itching and Other (See Comments)    Has patient had a PCN reaction causing immediate rash, facial/tongue/throat swelling, SOB or lightheadedness with hypotension: no Has patient had a PCN reaction causing severe rash involving mucus membranes or skin necrosis: no Has patient had a PCN reaction that required hospitalization: yes Has patient had a PCN reaction occurring within the last 10 years: no If all of the above answers are "NO", then may proceed with Cephalosporin use.    Patient Measurements: Height: 5\' 3"  (160 cm) Weight: 57.1 kg (125 lb 14.1 oz) IBW/kg (Calculated) : 52.4 Heparin Dosing Weight: 57.1 kg  Vital Signs: Temp: 97.7 F (36.5 C) (12/05 0536) Temp Source: Oral (12/05 0536) BP: 160/85 (12/05 0536) Pulse Rate: 110 (12/05 0536)  Labs: Recent Labs    04/05/23 1703 04/05/23 1922 04/06/23 0425 04/06/23 1020  HGB 9.9*  --  9.3*  --   HCT 35.2*  --  32.9*  --   PLT 330  --  263  --   LABPROT 16.1*  --   --   --   INR 1.3*  --   --   --   HEPARINUNFRC  --   --   --  0.21*  CREATININE 0.84  --  0.64  --   TROPONINIHS 71* 67*  --   --     Estimated Creatinine Clearance: 51.8 mL/min (by C-G formula based on SCr of 0.64 mg/dL).   Medical History: Past Medical History:  Diagnosis Date   Asthma, mild    Intermittent   Atrial fibrillation [facility])    Feb 2023   Cancer [facility])     Chronic back pain    Echocardiogram abnormal 09/2008   With mild aortic valve and mitral valve regurgitation   Heart murmur    History of left breast cancer 1999   Hyperlipidemia    Hypertension    Hypothyroidism    IBS (irritable bowel syndrome)    Lactose intolerance    Osteoporosis    Peptic ulcer disease    rheumatoid Arthritis    Type II diabetes mellitus (HCC)    [facility] 08-2012 checks cbg bid   Ulcer, duodenal, acute, with obstruction 1999   Wears glasses     Medications: Warfarin PTA - currently on hold  Assessment: 72 year old female with history of atrial fibrillation on warfarin PTA. INR subtherapeutic on admission. Warfarin to be held pending rule out need for procedure. Pharmacy consulted for heparin management.  Today, 04/06/23: Heparin level 0.21 - subtherapeutic on heparin @800  units/hr Hgb 9.3, plts 263 - stable  No s/sx of bleeding reported  Goal of Therapy:  Heparin level 0.3-0.7 units/ml Monitor platelets by anticoagulation protocol: Yes   Plan:  -Increase heparin infusion to 950 units/hr -Check 8hr heparin level -Daily CBC -Follow up appropriate restart of warfarin    Cherylin Mylar, PharmD Clinical Pharmacist  12/5/202411:42 AM

## 2023-04-06 NOTE — Consult Note (Addendum)
Cardiology Consultation   Patient ID: KARNE TLATELPA MRN: [id number]; DOB: 06-23-1950  Admit date: 04/05/2023 Date of Consult: 04/06/2023  PCP:  Tammy Retort, MD   [facility] Providers Cardiologist:  Tammy Lerner, DO   {  Requesting physician: Tammy Mura, MD Reason for consultation: Heart failure, near-syncope  Patient Profile:   [name] is a 72 y.o. female with a hx of paroxysmal atrial fibrillation, mitral stenosis/regurgitation, aortic stenosis, type 2 diabetes, RA, breast cancer status postlumpectomy/chemo/radiation, former smoker, aortic atherosclerosis, hypertension who is being seen 04/06/2023 for the evaluation of CHF at the request of Dr. Mahala Jimenez .  History of Present Illness:   [name] has a complex past medical history as outlined above.  She started following cardiology in 2023 due to new onset of atrial fibrillation.  Atrial fibrillation is thought to be secondary to valvular A-fib for which she was cardioverted once and has been on Coumadin for thromboembolic prophylaxis.  She has underlying valvular heart disease but chose medical management with longitudinal follow-up.  In November 2024 she called the office noting that she was becoming more short of breath and winded with effort related activities.  Plan was to increase diuresis repeat an echocardiogram and follow-up as outpatient.  However she presented to the hospital sooner due to progressive symptoms.  She has been experiencing lower extremity swelling, orthopnea, PND, and dyspnea on exertion.  She denies anginal chest pain.    Currently patient admitted for likely vasovagal episode and CHF.  She does not remember quite exactly what brought her in however she had noted progressive orthopnea, shortness of breath over the past week or 2 however has had these complaints that have been persisting since about a year ago.  She had also reported feeling dizzy after using the restroom today  and feeling weak.  She denied losing consciousness.  On arrival she was noted to be tachycardic with heart rates in the 120s however now improved.  She was started on IV Lasix 40 mg twice daily with improvement in symptoms of shortness of breath.  Overall seems to be comfortable now and sitting upright in her chair in nonacute distress.  She was started on IV heparin due to subtherapeutic INR.  Echo was obtained that shows worsening valvular disease.  Overall patient feels much better with IV diuresis still has intermittent complaints of dizziness and some shortness of breath but mildly symptomatic now.  She reports being very functional at home and even drives to go to the grocery store.    BNP 1500, troponin 71-67.  Chest x-ray negative for acute disease.  Normal renal function.  Hemoglobin 9.3.  TSH 2.2.  A1c 9.    Past Medical History:  Diagnosis Date   Asthma, mild    Intermittent   Atrial fibrillation [facility])    Feb 2023   Cancer [facility])    Chronic back pain    Echocardiogram abnormal 09/2008   With mild aortic valve and mitral valve regurgitation   Heart murmur    History of left breast cancer 1999   Hyperlipidemia    Hypertension    Hypothyroidism    IBS (irritable bowel syndrome)    Lactose intolerance    Osteoporosis    Peptic ulcer disease    rheumatoid Arthritis    Type II diabetes mellitus (HCC)    [facility] 08-2012 checks cbg bid   Ulcer, duodenal, acute, with obstruction 1999   Wears glasses     Past Surgical  History:  Procedure Laterality Date   ABDOMINAL HYSTERECTOMY     BREAST IMPLANT REMOVAL     Breast implant infected removed implant 06/06 infected and removed 11/05   BREAST LUMPECTOMY Left 12/1997   Left breast   BREAST RECONSTRUCTION     CARDIOVERSION N/A 08/11/2021   Procedure: CARDIOVERSION;  Surgeon: Tammy Lerner, DO;  Location: [facility];  Service: Cardiovascular;  Laterality: N/A;   CHOLECYSTECTOMY     CYSTOSCOPY W/ URETERAL STENT PLACEMENT Left  01/19/2018   Procedure: CYSTOSCOPY WITH RETROGRADE PYELOGRAM/URETERAL STENT PLACEMENT;  Surgeon: Marcine Matar, MD;  Location: [facility]S;  Service: Urology;  Laterality: Left;   CYSTOSCOPY W/ URETERAL STENT PLACEMENT Right 10/27/2021   Procedure: CYSTOSCOPY WITH RETROGRADE PYELOGRAM/URETERAL STENT PLACEMENT;  Surgeon: Sebastian Ache, MD;  Location: [facility] OR;  Service: Urology;  Laterality: Right;   CYSTOSCOPY WITH RETROGRADE PYELOGRAM, URETEROSCOPY AND STENT PLACEMENT Right 11/19/2021   Procedure: CYSTOSCOPY WITH RETROGRADE PYELOGRAM, URETEROSCOPY AND STENT EXCHANGE;  Surgeon: Sebastian Ache, MD;  Location: [facility];  Service: Urology;  Laterality: Right;  1 HR   CYSTOSCOPY WITH STENT PLACEMENT  10/27/2021   cone   EXTRACORPOREAL SHOCK WAVE LITHOTRIPSY Left 02/15/2018   Procedure: LEFT EXTRACORPOREAL SHOCK WAVE LITHOTRIPSY (ESWL);  Surgeon: Malen Gauze, MD;  Location: [facility];  Service: Urology;  Laterality: Left;   HOLMIUM LASER APPLICATION Right 11/19/2021   Procedure: HOLMIUM LASER APPLICATION;  Surgeon: Sebastian Ache, MD;  Location: [facility];  Service: Urology;  Laterality: Right;   MASTECTOMY  07/2003   Left   other     Left Arm FX   OTHER SURGICAL HISTORY     Hysterectomy- Ovaries intact    OTHER SURGICAL HISTORY     Fracture Left HIp pinning sx done   PARTIAL GASTRECTOMY  1999   due to ulcers, earlier sx done on stomach also    Inpatient Medications: Scheduled Meds:  buPROPion  150 mg Oral QPM   diltiazem  300 mg Oral Daily   escitalopram  10 mg Oral QPM   folic acid  2 mg Oral QPM   furosemide  40 mg Intravenous Q12H   hydroxychloroquine  200 mg Oral QPM   insulin aspart  0-9 Units Subcutaneous TID WC   levothyroxine  175 mcg Oral QPM   metFORMIN  2,000 mg Oral QPM   morphine  30 mg Oral Q12H   pravastatin  40 mg Oral QPM   sodium chloride flush  3 mL Intravenous Q12H   Continuous Infusions:  sodium chloride     heparin 950 Units/hr (04/06/23 1150)   PRN  Meds: sodium chloride, acetaminophen, hydrocortisone, metoprolol tartrate, ondansetron (ZOFRAN) IV, oxyCODONE-acetaminophen, sodium chloride flush  Allergies:    Allergies  Allergen Reactions   Fish Allergy Anaphylaxis and Hives   Amaryl [Glimepiride] Itching   Avalide [Irbesartan-Hydrochlorothiazide] Itching   Avandia [Rosiglitazone] Itching   Byetta 10 Mcg Pen [Exenatide] Itching   Codeine Itching    Other reaction(s): Unknown   Demerol [Meperidine Hcl] Itching   Glucovance [Glyburide-Metformin] Itching   Iodinated Contrast Media Itching    Other reaction(s):itching   Lisinopril Itching   Naprosyn [Naproxen] Hives   Penicillins Itching and Other (See Comments)    Has patient had a PCN reaction causing immediate rash, facial/tongue/throat swelling, SOB or lightheadedness with hypotension: no Has patient had a PCN reaction causing severe rash involving mucus membranes or skin necrosis: no Has patient had a PCN reaction that required hospitalization: yes Has patient had a PCN reaction occurring  within the last 10 years: no If all of the above answers are "NO", then may proceed with Cephalosporin use.    Social History:   Social History   Socioeconomic History   Marital status: Single    Spouse name: Not on file   Number of children: 2   Years of education: Not on file   Highest education level: Not on file  Occupational History   Not on file  Tobacco Use   Smoking status: Former    Current packs/day: 0.00    Average packs/day: 1 pack/day for 5.0 years (5.0 ttl pk-yrs)    Types: Cigarettes    Start date: 05/13/1995    Quit date: 05/12/2000    Years since quitting: 22.9   Smokeless tobacco: Never  Vaping Use   Vaping status: Never Used  Substance and Sexual Activity   Alcohol use: No   Drug use: No   Sexual activity: Not on file  Other Topics Concern   Not on file  Social History Narrative   Not on file   Social Determinants of Health   Financial Resource  Strain: Not on file  Food Insecurity: No Food Insecurity (04/05/2023)   Hunger Vital Sign    Worried About Running Out of Food in the Last Year: Never true    Ran Out of Food in the Last Year: Never true  Transportation Needs: No Transportation Needs (04/05/2023)   PRAPARE - Administrator, Civil Service (Medical): No    Lack of Transportation (Non-Medical): No  Physical Activity: Not on file  Stress: Not on file  Social Connections: Not on file  Intimate Partner Violence: Not At Risk (04/05/2023)   Humiliation, Afraid, Rape, and Kick questionnaire    Fear of Current or Ex-Partner: No    Emotionally Abused: No    Physically Abused: No    Sexually Abused: No    Family History:   Family History  Problem Relation Age of Onset   Heart failure Mother    Osteoporosis Mother    Heart attack Father    Hypertension Father    Diabetes Father    Diabetes Mellitus II Father    Diabetes Sister    Thyroid cancer Sister    Diabetes Brother    Colon cancer Neg Hx    Esophageal cancer Neg Hx    Stomach cancer Neg Hx      ROS:  Please see the history of present illness.  All other ROS reviewed and negative.     Physical Exam/Data:   Vitals:   04/06/23 0500 04/06/23 0536 04/06/23 1014 04/06/23 1428  BP:  (!) 160/85  125/65  Pulse:  (!) 110  86  Resp:  16  18  Temp:  97.7 F (36.5 C)  98.5 F (36.9 C)  TempSrc:  Oral  Oral  SpO2:  94%  95%  Weight: 53.5 kg  57.1 kg   Height:   5\' 3"  (1.6 m)     Intake/Output Summary (Last 24 hours) at 04/06/2023 1555 Last data filed at 04/06/2023 1459 Gross per 24 hour  Intake 1876.92 ml  Output 1600 ml  Net 276.92 ml      04/06/2023   10:14 AM 04/06/2023    5:00 AM 01/05/2023    2:54 PM  Last 3 Weights  Weight (lbs) 125 lb 14.1 oz 117 lb 15.1 oz 113 lb  Weight (kg) 57.1 kg 53.5 kg 51.256 kg     Body mass index is 22.3  kg/m.  General:  Well nourished, well developed, in no acute distress HEENT: normal Neck: + JVD Vascular:  No carotid bruits; Distal pulses 2+ bilaterally Cardiac:  3/6 murmur Lungs:  clear to auscultation bilaterally, no wheezing, rhonchi or rales  Abd: soft, nontender, no hepatomegaly  Ext: 2-3+ pitting edema Musculoskeletal:  No deformities, BUE and BLE strength normal and equal Skin: warm and dry  Neuro:  CNs 2-12 intact, no focal abnormalities noted Psych:  Normal affect   EKG:  The EKG was personally reviewed and demonstrates: Sinus tachycardia, heart rate 127.  Probable LVH.  Probable repolarization abnormality. Telemetry:  Telemetry was personally reviewed and demonstrates: Normal sinus rhythm at times tachycardia with heart rates 100 210 but generally in the 80s  Relevant CV Studies: Echocardiogram 04/06/2023 1. LV intracavitary gradient of . Left ventricular ejection  fraction, by estimation, is 55 to 60%. The left ventricle has normal  function. The left ventricle has no regional wall motion abnormalities.  There is severe left ventricular hypertrophy.  Left ventricular diastolic parameters are consistent with Grade III  diastolic dysfunction (restrictive). There is the interventricular septum  is flattened in systole and diastole, consistent with right ventricular  pressure and volume overload. The  average left ventricular global longitudinal strain is -5.3 %. The global  longitudinal strain is abnormal.   2. RVSP . Right ventricular systolic function is mildly reduced.  The right ventricular size is moderately enlarged. Mildly increased right  ventricular wall thickness. There is severely elevated pulmonary artery  systolic pressure.   3. Left atrial size was severely dilated.   4. Right atrial size was severely dilated.   5. The mitral valve is rheumatic. Moderate mitral valve regurgitation.  Severe mitral stenosis. The mean mitral valve gradient is 20.5 mmHg.  Severe mitral annular calcification.   6. The tricuspid valve is degenerative. Tricuspid valve  regurgitation is  moderate.   7. Heavily calcified with SVI of 61mL/m2, DVI 0.325 consistent with at  least moderate low flow, low gradient AS. The aortic valve is normal in  structure. There is severe calcifcation of the aortic valve. There is  severe thickening of the aortic valve.  Aortic valve regurgitation is mild to moderate. Moderate to severe aortic  valve stenosis. Aortic valve area, by VTI measures 0.78 cm. Aortic valve  mean gradient measures 16.0 mmHg. Aortic valve Vmax measures 2.71 m/s.   8. Systolic notching of PA doppler waveform consistent with pulmonary  hypertension.   9. The inferior vena cava is normal in size with <50% respiratory  variability, suggesting right atrial pressure of 8 mmHg.    Laboratory Data:  High Sensitivity Troponin:   Recent Labs  Lab 04/05/23 1703 04/05/23 1922  TROPONINIHS 71* 67*     Chemistry Recent Labs  Lab 04/05/23 1703 04/05/23 1922 04/06/23 0425  NA 139  --  137  K 3.0*  --  3.5  CL 99  --  98  CO2 27  --  27  GLUCOSE 207*  --  155*  BUN 20  --  16  CREATININE 0.84  --  0.64  CALCIUM 8.6*  --  8.6*  MG  --  1.2* 1.9  GFRNONAA >60  --  >60  ANIONGAP 13  --  12    Recent Labs  Lab 04/05/23 1703 04/06/23 0425  PROT 6.7  --   ALBUMIN 3.3* 3.2*  AST 22  --   ALT 18  --   ALKPHOS 60  --  BILITOT 0.6  --    Lipids No results for input(s): "CHOL", "TRIG", "HDL", "LABVLDL", "LDLCALC", "CHOLHDL" in the last 168 hours.  Hematology Recent Labs  Lab 04/05/23 1703 04/06/23 0425  WBC 12.5* 9.9  RBC 4.20 3.95  HGB 9.9* 9.3*  HCT 35.2* 32.9*  MCV 83.8 83.3  MCH 23.6* 23.5*  MCHC 28.1* 28.3*  RDW 17.1* 17.3*  PLT 330 263   Thyroid  Recent Labs  Lab 04/06/23 0425  TSH 2.200    BNP Recent Labs  Lab 04/05/23 1703  BNP 1,498.3*    DDimer No results for input(s): "DDIMER" in the last 168 hours.   Radiology/Studies:  ECHOCARDIOGRAM COMPLETE  Result Date: 04/06/2023    ECHOCARDIOGRAM REPORT   Patient  Name:   [name] Date of Exam: 04/06/2023 Medical Rec #:  [id number]        Height:       63.0 in Accession #:    [id number]       Weight:       117.9 lb Date of Birth:  [birth year]         BSA:          1.545 m Patient Age:    72 years         BP:           160/85 mmHg Patient Gender: F                HR:           110 bpm. Exam Location:  Inpatient Procedure: 2D Echo, Cardiac Doppler, Color Doppler and Strain Analysis Indications:     CHF- Acute Diastolic I50.31  History:         Patient has prior history of Echocardiogram examinations, most                  recent 08/29/2021. CHF, Arrythmias:Atrial Fibrillation; Risk                  Factors:Hypertension, Diabetes and Dyslipidemia.  Sonographer:     Lucendia Herrlich RCS Referring Phys:  [id number] Boyce Medici GONFA Diagnosing Phys: Clearnce Hasten  Sonographer Comments: Image acquisition challenging due to uncooperative patient. Attempted Global Longitudinal Strain IMPRESSIONS  1. LV intracavitary gradient of . Left ventricular ejection fraction, by estimation, is 55 to 60%. The left ventricle has normal function. The left ventricle has no regional wall motion abnormalities. There is severe left ventricular hypertrophy. Left ventricular diastolic parameters are consistent with Grade III diastolic dysfunction (restrictive). There is the interventricular septum is flattened in systole and diastole, consistent with right ventricular pressure and volume overload. The average left ventricular global longitudinal strain is -5.3 %. The global longitudinal strain is abnormal.  2. RVSP . Right ventricular systolic function is mildly reduced. The right ventricular size is moderately enlarged. Mildly increased right ventricular wall thickness. There is severely elevated pulmonary artery systolic pressure.  3. Left atrial size was severely dilated.  4. Right atrial size was severely dilated.  5. The mitral valve is rheumatic. Moderate mitral valve regurgitation.  Severe mitral stenosis. The mean mitral valve gradient is 20.5 mmHg. Severe mitral annular calcification.  6. The tricuspid valve is degenerative. Tricuspid valve regurgitation is moderate.  7. Heavily calcified with SVI of 96mL/m2, DVI 0.325 consistent with at least moderate low flow, low gradient AS. The aortic valve is normal in structure. There is severe calcifcation of the aortic valve. There is severe thickening of the aortic valve. Aortic  valve regurgitation is mild to moderate. Moderate to severe aortic valve stenosis. Aortic valve area, by VTI measures 0.78 cm. Aortic valve mean gradient measures 16.0 mmHg. Aortic valve Vmax measures 2.71 m/s.  8. Systolic notching of PA doppler waveform consistent with pulmonary hypertension.  9. The inferior vena cava is normal in size with <50% respiratory variability, suggesting right atrial pressure of 8 mmHg. FINDINGS  Left Ventricle: LV intracavitary gradient of . Left ventricular ejection fraction, by estimation, is 55 to 60%. The left ventricle has normal function. The left ventricle has no regional wall motion abnormalities. The average left ventricular global longitudinal strain is -5.3 %. The global longitudinal strain is abnormal. The left ventricular internal cavity size was small. There is severe left ventricular hypertrophy. The interventricular septum is flattened in systole and diastole, consistent with right ventricular pressure and volume overload. Left ventricular diastolic parameters are consistent with Grade III diastolic dysfunction (restrictive). Right Ventricle: RVSP . The right ventricular size is moderately enlarged. Mildly increased right ventricular wall thickness. Right ventricular systolic function is mildly reduced. There is severely elevated pulmonary artery systolic pressure. Left Atrium: Left atrial size was severely dilated. Right Atrium: Right atrial size was severely dilated. Pericardium: There is no evidence of  pericardial effusion. Mitral Valve: The mitral valve is rheumatic. There is severe thickening of the mitral valve leaflet(s). There is severe calcification of the mitral valve leaflet(s). Moderately decreased mobility of the mitral valve leaflets. Severe mitral annular calcification. Moderate mitral valve regurgitation. Severe mitral valve stenosis. MV peak gradient, 33.4 mmHg. The mean mitral valve gradient is 20.5 mmHg. Tricuspid Valve: The tricuspid valve is degenerative in appearance. Tricuspid valve regurgitation is moderate . No evidence of tricuspid stenosis. Aortic Valve: Heavily calcified with SVI of 53mL/m2, DVI 0.325 consistent with at least moderate low flow, low gradient AS. The aortic valve is normal in structure. There is severe calcifcation of the aortic valve. There is severe thickening of the aortic valve. Aortic valve regurgitation is mild to moderate. Aortic regurgitation PHT measures 304 msec. Moderate to severe aortic stenosis is present. Aortic valve mean gradient measures 16.0 mmHg. Aortic valve peak gradient measures 29.3 mmHg. Aortic valve area, by VTI measures 0.78 cm. Pulmonic Valve: The pulmonic valve was normal in structure. Pulmonic valve regurgitation is mild. No evidence of pulmonic stenosis. Aorta: The aortic root is normal in size and structure. Pulmonary Artery: Systolic notching of PA doppler waveform consistent with pulmonary hypertension. Venous: The inferior vena cava is normal in size with less than 50% respiratory variability, suggesting right atrial pressure of 8 mmHg. IAS/Shunts: No atrial level shunt detected by color flow Doppler.  LEFT VENTRICLE PLAX 2D LVIDd:         2.80 cm   Diastology LVIDs:         2.13 cm   LV e' medial:    4.20 cm/s LV PW:         1.40 cm   LV E/e' medial:  56.4 LV IVS:        1.20 cm   LV e' lateral:   2.85 cm/s LVOT diam:     1.60 cm   LV E/e' lateral: 83.2 LV SV:         28 LV SV Index:   18        2D Longitudinal Strain LVOT Area:     2.01  cm  2D Strain GLS Avg:     -5.3 %  RIGHT VENTRICLE  IVC RV S prime:     9.11 cm/s  IVC diam: 2.10 cm TAPSE (M-mode): 1.0 cm LEFT ATRIUM              Index        RIGHT ATRIUM           Index LA diam:        5.20 cm  3.37 cm/m   RA Area:     16.50 cm LA Vol (A2C):   120.0 ml 77.67 ml/m  RA Volume:   39.80 ml  25.76 ml/m LA Vol (A4C):   69.0 ml  44.66 ml/m LA Biplane Vol: 99.0 ml  64.08 ml/m  AORTIC VALVE                     PULMONIC VALVE AV Area (Vmax):    0.78 cm      PR End Diast Vel: 6.76 msec AV Area (Vmean):   0.71 cm AV Area (VTI):     0.78 cm AV Vmax:           270.67 cm/s AV Vmean:          184.000 cm/s AV VTI:            0.361 m AV Peak Grad:      29.3 mmHg AV Mean Grad:      16.0 mmHg LVOT Vmax:         105.00 cm/s LVOT Vmean:        64.900 cm/s LVOT VTI:          0.140 m LVOT/AV VTI ratio: 0.39 AI PHT:            304 msec  AORTA Ao Root diam: 3.00 cm Ao Asc diam:  3.30 cm MITRAL VALVE                TRICUSPID VALVE MV Area (PHT): 6.65 cm     TR Peak grad:   69.6 mmHg MV Area VTI:   0.53 cm     TR Vmax:        417.00 cm/s MV Peak grad:  33.4 mmHg MV Mean grad:  20.5 mmHg    SHUNTS MV Vmax:       2.89 m/s     Systemic VTI:  0.14 m MV Vmean:      214.5 cm/s   Systemic Diam: 1.60 cm MV Decel Time: 114 msec MR Peak grad: 138.1 mmHg MR Vmax:      587.50 cm/s MV E velocity: 237.00 cm/s MV A velocity: 273.00 cm/s MV E/A ratio:  0.87 Clearnce Hasten Electronically signed by Clearnce Hasten Signature Date/Time: 04/06/2023/11:15:17 AM    Final (Updated)    DG Chest 2 View  Result Date: 04/06/2023 CLINICAL DATA:  Shortness of breath.  Abnormal chest x-ray. EXAM: CHEST - 2 VIEW COMPARISON:  04/05/2023 FINDINGS: Prominent cardiac enlargement. No definite evidence of infiltration or atelectasis. Calcification of the aorta. Calcification in the mitral valve annulus. No pleural effusions. No pneumothorax. Degenerative changes in the spine. Surgical clips in the upper abdomen and left axilla.  IMPRESSION: Diffuse cardiac enlargement. No evidence of active pulmonary disease. Electronically Signed   By: Burman Nieves M.D.   On: 04/06/2023 03:09   DG Chest Port 1 View  Result Date: 04/05/2023 CLINICAL DATA:  c/o dizziness, sob, and weakness on exertion. Per EMS pt felt like she was about to have a bowel movement and went to the restroom. She couldn't go so  she stood up and started feeling dizzy. EXAM: PORTABLE CHEST 1 VIEW COMPARISON:  Chest x-ray 05/14/2018 FINDINGS: Marked patient rotation. Enlarged cardiac silhouette with abnormal left heart border. No focal consolidation. No pulmonary edema. No pleural effusion. No pneumothorax. No acute osseous abnormality. IMPRESSION: Enlarged cardiac silhouette with abnormal left heart border. Finding may be due to patient rotation/positioning. Recommend repeat PA and lateral view of the chest for further evaluation. If patient clinically unstable, consider CT chest WITH intravenous contrast (CTA or CTPA if clinically indicated) for further evaluation. Electronically Signed   By: Tish Frederickson M.D.   On: 04/05/2023 18:06     Assessment and Plan:   Severe valvular disease  Pulmonary hypertension Rheumatic MV: moderate MR and Severe MS: mean gradient 20.5 with severe MAC Mild to moderate AR Moderate to severe AS: Heavily calcified with SVI of 18, DVI .325, mean gradient 16. Pulmonary hypertension: RVSP 78 LV intracavitary gradient 21, preserved EF, mildly reduced RV with grade 3 diastolic dysfunction and interventricular septum flattening.  Has had progressive disease when compared to echocardiogram 1 year ago.  Currently is DNR however after discussion she would opt for surgical intervention if deemed a stable candidate.  Plan now will be to optimize her from a volume standpoint and have advanced heart failure resume diuresis as her clinical status is precarious given her rate dependent and flow dependent nature.  Will need right and left heart  catheterization once optimized, eventually will need CT consult for high risk valvular surgery.  Currently appears to be decently compensating. Heart rates are generally in the 80s however intermittently spikes to the 110s to 120s, will need good rate control management.  Continue home dose of diltiazem 300 mg. Continue IV Lasix 40 mg twice daily, adding compression stockings.  Advanced heart failure has agreed to resume care, she will be transferred to [facility] with hospital admission.  Paroxysmal atrial fibrillation Previous DCCV April 2023 and still maintaining normal sinus rhythm.  Maintain potassium greater than 4 and magnesium greater than 2.  She is high risk for going into A-fib. Continue IV heparin in case of planned procedure Continue diltiazem 300 mg   Risk Assessment/Risk Scores:   New York Heart Association (NYHA) Functional Class NYHA Class III  CHA2DS2-VASc Score = 5   This indicates a 7.2% annual risk of stroke. The patient's score is based upon: CHF History: 0 HTN History: 1 Diabetes History: 1 Stroke History: 0 Vascular Disease History: 1 Age Score: 1 Gender Score: 1     For questions or updates, please contact  [facility] Please consult [email or web address] for contact info under    Signed, Abagail Kitchens, PA-C  04/06/2023 3:55 PM   ADDENDUM:   Patient seen and examined with Yvonna Alanis PA-C.  I personally taken a history, examined the patient, reviewed relevant notes,  laboratory data / imaging studies.  I performed a substantive portion of this encounter and formulated the important aspects of the plan.  I agree with the APP's note, impression, and recommendations; however, I have edited the note to reflect changes or salient points (which are noted in italics).   The patient has been experiencing shortness of breath with effort related activities which is now progressed to orthopnea, paroxysmal nocturnal dyspnea and lower extremity swelling to the  point that she seeks medical attention and could not wait outpatient follow-up. Clinically denies anginal chest pain. Denies syncopal event. At times with changing positions or post micturition she does feel lightheaded and dizzy.  Right now she is sitting upright in chair, no conversational dyspnea, hemodynamically stable.  Shortness of breath slowly improving.  Continues to have bilateral lower extremity swelling and JVP on physical examination.  Cardiology is asked to consult during this hospitalization given her acute decompensated heart failure and valvular heart disease.  No family present at bedside.  Review of Systems  Constitutional: Positive for weight gain.  Cardiovascular:  Positive for dyspnea on exertion, leg swelling, near-syncope, orthopnea and paroxysmal nocturnal dyspnea. Negative for chest pain, claudication, irregular heartbeat, palpitations and syncope.  Respiratory:  Positive for shortness of breath.   Hematologic/Lymphatic: Negative for bleeding problem.   PHYSICAL EXAM: Today's Vitals   04/06/23 1245 04/06/23 1428 04/06/23 2008 04/06/23 2009  BP:  125/65  134/70  Pulse:  86  90  Resp:  18  18  Temp:  98.5 F (36.9 C)  98.6 F (37 C)  TempSrc:  Oral  Oral  SpO2:  95%  93%  Weight:      Height:      PainSc: 3   7     Body mass index is 22.3 kg/m.   Net IO Since Admission: 591.36 mL [04/06/23 2013]  Filed Weights   04/06/23 0500 04/06/23 1014  Weight: 53.5 kg 57.1 kg    Physical Exam  Constitutional: No distress.  hemodynamically stable  HENT:  Poor dentition  Neck: JVD present.  Cardiovascular: Normal rate, regular rhythm, S1 normal and S2 normal. Exam reveals no gallop, no S3 and no S4.  Murmur heard. Harsh midsystolic murmur is present with a grade of 3/6 at the upper right sternal border. Rumbling crescendo diastolic murmur is present with a grade of 3/4 at the apex. Pulmonary/Chest: Effort normal. No stridor. She has no wheezes. She has  bibasilar rales.  Abdominal: Soft. Bowel sounds are normal. She exhibits no distension. There is no abdominal tenderness.  Musculoskeletal:        General: Edema (bilateral extending to the thighs laterally) present.     Cervical back: Neck supple.  Neurological: She is alert and oriented to person, place, and time. She has intact cranial nerves (2-12).  Skin: Skin is warm.   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 Non-insulin-dependent diabetes mellitus type 2. Hypertension. Aortic atherosclerosis.  Recommendations:  Acute decompensated heart failure with preserved EF Stage C, NYHA class III Precipitated by underlying valvular heart disease. Currently on Lasix 40 mg IV push twice daily Net IO Since Admission: 591.36 mL [04/06/23 2021], likely not accurate.  Spoke to RN with regards to strict I's and O's and daily weights Clinically endorses less shortness of breath. Will focus on diuresis for now and when closer to euvolemia discuss workup for valve replacements.  I had a long discussion with the patient with regards to the underlying cause of her acute decompensated heart failure.  Patient states that she is willing to proceed forward for being considered for valve replacements for now. She understands that once she is euvolemic she will need to undergo left and right heart catheterization to evaluate for coronary artery disease as well as hemodynamics. TEE would be ideal to reevaluate valvular heart disease; however, would be reluctant given her  reduced reserve at baseline for anesthesia.  She will also need dental clearance and/or additional workup. However, she is willing to at least have discussion with CT surgery to know what her options are with regards to aortic and mitral valve replacements. Given the severity of valvular heart disease, pulm hypertension per echocardiography, she needs more acuity care and will be transferred to [facility].  Will request advanced heart failure to help further optimize her for reasons mentioned above. Bilateral compression stockings. Continue diuresis. Continue telemetry. Would recommend continuation of IV heparin drip as her INRs are subtherapeutic.  Hold Coumadin for now.   Severe mitral stenosis and paradoxical low-flow low gradient aortic stenosis: See above  Long-term oral anticoagulation: On Coumadin due to valvular atrial fibrillation.  Currently on IV heparin.  Non-insulin-dependent diabetes mellitus type 2: Hemoglobin A1c of 9.  Reemphasized importance of glycemic control.  Currently managed by primary team.  Patient's questions and concerns are addressed to her satisfaction. Patient states that her next of kin her 2 sons who are in town and will help her in the decision process.  Will follow her with you.    This note was created using a voice recognition software as a result there may be grammatical errors inadvertently enclosed that do not reflect the nature of this encounter. Every attempt is made to correct such errors.   Tammy Lerner, DO, [facility]  [address] [place], [place] [address] Pager: [phone] Office: [phone] 04/06/2023 8:13 PM

## 2023-04-06 NOTE — Progress Notes (Signed)
Heart Failure Navigator Progress Note  Assessed for Heart & Vascular TOC clinic readiness.  Patient does not meet criteria due to EF 55-60%, has a scheduled [facility] appointment on 04/17/2023. .   Navigator will sign off at this time.   Rhae Hammock, BSN, Scientist, clinical (histocompatibility and immunogenetics) Only

## 2023-04-06 NOTE — Evaluation (Signed)
Physical Therapy Evaluation Patient Details Name: [name] MRN: [id number] DOB: Jun 17, 1950 Today's Date: 04/06/2023  History of Present Illness  Pt is a 72 y/o F admitted on 04/05/23 after presenting with c/o near syncope & weakness. Pt is being treated for acute decompensated HFpEF. PMH: RA, Breast CA s/p lumpectomy chemo radiation & subsequent L mastectomy, paroxysmal a-fib, DM2, HTN, HLD, hypothyroid, chronic pain on opiates  Clinical Impression  Pt seen for PT evaluation with pt agreeable. Pt reports prior to admission she was independent without AD, denies falls, living alone in a mobile home with 3 steps with B rails to enter. On this date, pt is able to complete STS independently & ambulate around unit without AD with supervision fade to mod I. Pt with slight lateral instability but no overt LOB. Pt appears close to baseline level of function. PT to complete current orders at this time, please re-consult if new needs arise.        If plan is discharge home, recommend the following:     Can travel by private vehicle        Equipment Recommendations None recommended by PT  Recommendations for Other Services       Functional Status Assessment Patient has not had a recent decline in their functional status     Precautions / Restrictions Precautions Precautions: None Restrictions Weight Bearing Restrictions: No      Mobility  Bed Mobility               General bed mobility comments: not tested, pt received & left sitting in recliner    Transfers Overall transfer level: Independent Equipment used: None                    Ambulation/Gait Ambulation/Gait assistance: Supervision, Modified independent (Device/Increase time) Gait Distance (Feet): 150 Feet Assistive device: None   Gait velocity: slightly decreased     General Gait Details: slight lateral sway but no overt LOB, pt reports gait is at baseline  Stairs Stairs:  (declines practicing)           Wheelchair Mobility     Tilt Bed    Modified Rankin (Stroke Patients Only)       Balance Overall balance assessment: Needs assistance   Sitting balance-Leahy Scale: Normal     Standing balance support: During functional activity, No upper extremity supported Standing balance-Leahy Scale: Good                               Pertinent Vitals/Pain Pain Assessment Pain Assessment: No/denies pain    Home Living Family/patient expects to be discharged to:: Private residence Living Arrangements: Alone Available Help at Discharge: Family;Available PRN/intermittently Type of Home: Mobile home Home Access: Stairs to enter Entrance Stairs-Rails: Right;Left;Can reach both Entrance Stairs-Number of Steps: 3   Home Layout: One level        Prior Function Prior Level of Function : Independent/Modified Independent;Driving             Mobility Comments: ambulatory without AD, denies falls       Extremity/Trunk Assessment   Upper Extremity Assessment Upper Extremity Assessment: Overall WFL for tasks assessed    Lower Extremity Assessment Lower Extremity Assessment: Overall WFL for tasks assessed       Communication   Communication Communication: No apparent difficulties  Cognition Arousal: Alert Behavior During Therapy: WFL for tasks assessed/performed (slightly irritable re: entire situation) Overall  Cognitive Status: Within Functional Limits for tasks assessed                                 General Comments: does not seem very open to education        General Comments      Exercises     Assessment/Plan    PT Assessment Patient does not need any further PT services  PT Problem List         PT Treatment Interventions      PT Goals (Current goals can be found in the Care Plan section)  Acute Rehab PT Goals Patient Stated Goal: get better PT Goal Formulation: With patient Time For Goal Achievement:  04/20/23 Potential to Achieve Goals: Good    Frequency       Co-evaluation               AM-PAC PT "6 Clicks" Mobility  Outcome Measure Help needed turning from your back to your side while in a flat bed without using bedrails?: None Help needed moving from lying on your back to sitting on the side of a flat bed without using bedrails?: None Help needed moving to and from a bed to a chair (including a wheelchair)?: None Help needed standing up from a chair using your arms (e.g., wheelchair or bedside chair)?: None Help needed to walk in hospital room?: None Help needed climbing 3-5 steps with a railing? : None 6 Click Score: 24    End of Session   Activity Tolerance: Patient tolerated treatment well Patient left: in chair;with call bell/phone within reach        Time: 1349-1359 PT Time Calculation (min) (ACUTE ONLY): 10 min   Charges:   PT Evaluation $PT Eval Low Complexity: 1 Low   PT General Charges $$ ACUTE PT VISIT: 1 Visit         Aleda Grana, PT, DPT 04/06/23, 2:06 PM   Sandi Mariscal 04/06/2023, 2:05 PM

## 2023-04-06 NOTE — Plan of Care (Signed)
  Problem: Activity: Goal: Risk for activity intolerance will decrease Outcome: Progressing   Problem: Coping: Goal: Level of anxiety will decrease Outcome: Progressing   Problem: Pain Management: Goal: General experience of comfort will improve Outcome: Progressing   Problem: Safety: Goal: Ability to remain free from injury will improve Outcome: Progressing

## 2023-04-06 NOTE — Progress Notes (Signed)
Echocardiogram 2D Echocardiogram has been performed.  [name] 04/06/2023, 9:38 AM

## 2023-04-06 NOTE — Progress Notes (Signed)
HOSPITALIST ROUNDING NOTE [name] UVO:[id number]  DOB: [birth year]  DOA: 04/05/2023  PCP: Noberto Retort, MD  04/06/2023,7:07 AM   LOS: 1 day      Code Status: Full code From: Home  current Dispo: likely home     72 year old female known rheumatoid arthritis follows with Dr. Narda Amber and Plaquenil  breast cancer X2 2+ lumpectomy chemo radiation-subsequent left mastectomy Paroxysmal A-fib CHADVASC 5 DCCV 07/2021 on  Coumadin 2/2 mitral stenosis mitral regurg?-follows with Dr. Billy Coast  DM TY 2 HTN HLD hypothyroid Prior left ureteric stone with sepsis-admission 2023, 2019 Paroxysmal A-fib Chronic pain on opiates  1-2 week h/o have progressive exertional DOE PND----had been incrementally placed on HCTZ then transitioned to Lasix 20 by cardiologist and cardiology recommended outpatient echo to characterize the valve and heart failure. Got up to go to the restroom 12/4 felt weak dizzy had incontinence of bowel went outside found to be weak - Fever chills other issues-- Sodium 3.0 BUN/creatinine 20/0.8 BNP 1498 troponin 71-->67 WBC 12.5 hemoglobin 9.9 platelet 330 Magnesium 1.2 and replaced A1c 9.0 cx-ray diffuse cardiac enlargement  Plan  Acute decompensated HFpEF in the setting of mitral regurg I/O even weight inaccurate Not taking Lasix 20 daily here was given 60 in ED IV and now is on 40 IV twice daily-expect will improve over next several days Hold losartan 50 for now and reimplement slowly hold HCTZ 25 follow-up echocardiogram ordered  Syncope?  Vasovagal Could be from hypotension but probably was vasovagal given history Monitor on telemetry tonight and reeval in a.m.  Paroxysmal A-fib CHA2DS2-VASc 5 DCCV 2023/Coumadin Continue Cardizem 300 daily check labs periodically Is on Coumadin for reported mitral regurg-transitioned to heparin GTT while awaiting possible workup etc. likely can go back to Coumadin  Hypokalemia hypomagnesemia Replaced-stop p.o. replacement check  labs including mag in the morning  HTN Med adjustments as above  Hypothyroid Continue Synthroid 175 every afternoon  Breast cancer X2 Status post lumpectomy chemo and mastectomy apparently received radiation-no sequelae of the same  DM TY 2 Reported history and is on metformin-resume metformin XR 2000 every afternoon and can follow CBGs while on sensitive sliding scale  Rheumatoid arthritis/Orencia Plaquenil Chronic pain Continues on Plaquenil 200 every afternoon will resume Orencia in the outpatient setting   DVT prophylaxis: Heparin GTT  Status is: Inpatient Remains inpatient appropriate because:   Requires diuresis    Subjective:  Relates h/o of SOB over several weeks with use of HCTZ and was graduated to Lasix Feels okay now Much less swollen No chest pain No fever  Objective + exam Vitals:   04/05/23 2122 04/06/23 0132 04/06/23 0500 04/06/23 0536  BP: (!) 148/97 (!) 155/88  (!) 160/85  Pulse: (!) 110 (!) 107  (!) 110  Resp: 15 15  16   Temp: 97.7 F (36.5 C) (!) 97.5 F (36.4 C)  97.7 F (36.5 C)  TempSrc: Oral Oral  Oral  SpO2: 99% 94%  94%  Weight:   53.5 kg   Height:       Filed Weights   04/06/23 0500  Weight: 53.5 kg    Examination:  EOMI NCAT no focal deficit no icterus no pallor no wheeze colonoscopy cannot appreciate any TVR TVF Some JVD with pressure over right upper quadrant Abdomen is soft otherwise S1-S2 holosystolic murmur LUSB ROM intact moving 4 limbs equally Grade 1 edema  Data Reviewed: reviewed   CBC    Component Value Date/Time   WBC 9.9 04/06/2023 0425  RBC 3.95 04/06/2023 0425   HGB 9.3 (L) 04/06/2023 0425   HGB 10.5 (L) 01/05/2023 1622   HCT 32.9 (L) 04/06/2023 0425   HCT 33.9 (L) 01/05/2023 1622   PLT 263 04/06/2023 0425   PLT 408 01/05/2023 1622   MCV 83.3 04/06/2023 0425   MCV 85 01/05/2023 1622   MCH 23.5 (L) 04/06/2023 0425   MCHC 28.3 (L) 04/06/2023 0425   RDW 17.3 (H) 04/06/2023 0425   RDW 14.8  01/05/2023 1622   LYMPHSABS 0.9 04/05/2023 1703   MONOABS 0.6 04/05/2023 1703   EOSABS 0.0 04/05/2023 1703   BASOSABS 0.0 04/05/2023 1703      Latest Ref Rng & Units 04/06/2023    4:25 AM 04/05/2023    5:03 PM 01/05/2023    4:22 PM  CMP  Glucose 70 - 99 mg/dL 478  295  621   BUN 8 - 23 mg/dL 16  20  20    Creatinine 0.44 - 1.00 mg/dL 3.08  6.57  8.46   Sodium 135 - 145 mmol/L 137  139  139   Potassium 3.5 - 5.1 mmol/L 3.5  3.0  4.8   Chloride 98 - 111 mmol/L 98  99  97   CO2 22 - 32 mmol/L 27  27  22    Calcium 8.9 - 10.3 mg/dL 8.6  8.6  9.7   Total Protein 6.5 - 8.1 g/dL  6.7    Total Bilirubin <1.2 mg/dL  0.6    Alkaline Phos 38 - 126 U/L  60    AST 15 - 41 U/L  22    ALT 0 - 44 U/L  18       Scheduled Meds:  furosemide  40 mg Intravenous Q12H   insulin aspart  0-5 Units Subcutaneous QHS   insulin aspart  0-9 Units Subcutaneous TID WC   pravastatin  40 mg Oral QPM   sodium chloride flush  3 mL Intravenous Q12H   Continuous Infusions:  sodium chloride     heparin 800 Units/hr (04/06/23 0136)    Time  46  Rhetta Mura, MD  [facility]

## 2023-04-07 DIAGNOSIS — E119 Type 2 diabetes mellitus without complications: Secondary | ICD-10-CM | POA: Diagnosis not present

## 2023-04-07 DIAGNOSIS — I350 Nonrheumatic aortic (valve) stenosis: Secondary | ICD-10-CM | POA: Diagnosis not present

## 2023-04-07 DIAGNOSIS — I052 Rheumatic mitral stenosis with insufficiency: Secondary | ICD-10-CM | POA: Diagnosis not present

## 2023-04-07 DIAGNOSIS — I5031 Acute diastolic (congestive) heart failure: Secondary | ICD-10-CM | POA: Diagnosis not present

## 2023-04-07 DIAGNOSIS — I5033 Acute on chronic diastolic (congestive) heart failure: Secondary | ICD-10-CM | POA: Diagnosis not present

## 2023-04-07 LAB — BASIC METABOLIC PANEL
Anion gap: 16 — ABNORMAL HIGH (ref 5–15)
BUN: 24 mg/dL — ABNORMAL HIGH (ref 8–23)
CO2: 25 mmol/L (ref 22–32)
Calcium: 8.5 mg/dL — ABNORMAL LOW (ref 8.9–10.3)
Chloride: 95 mmol/L — ABNORMAL LOW (ref 98–111)
Creatinine, Ser: 0.68 mg/dL (ref 0.44–1.00)
GFR, Estimated: >60 mL/min (ref >60)
Glucose, Bld: 159 mg/dL — ABNORMAL HIGH (ref 70–99)
Potassium: 3.6 mmol/L (ref 3.5–5.1)
Sodium: 136 mmol/L (ref 135–145)

## 2023-04-07 LAB — CBC
HCT: 32.9 % — ABNORMAL LOW (ref 36.0–46.0)
Hemoglobin: 9.3 g/dL — ABNORMAL LOW (ref 12.0–15.0)
MCH: 23.5 pg — ABNORMAL LOW (ref 26.0–34.0)
MCHC: 28.3 g/dL — ABNORMAL LOW (ref 30.0–36.0)
MCV: 83.1 fL (ref 80.0–100.0)
Platelets: 254 10*3/uL (ref 150–400)
RBC: 3.96 MIL/uL (ref 3.87–5.11)
RDW: 17.5 % — ABNORMAL HIGH (ref 11.5–15.5)
WBC: 8.5 10*3/uL (ref 4.0–10.5)
nRBC: 0 % (ref 0.0–0.2)

## 2023-04-07 LAB — HEPARIN LEVEL (UNFRACTIONATED): Heparin Unfractionated: 0.38 [IU]/mL (ref 0.30–0.70)

## 2023-04-07 LAB — GLUCOSE, CAPILLARY
Glucose-Capillary: 155 mg/dL — ABNORMAL HIGH (ref 70–99)
Glucose-Capillary: 128 mg/dL — ABNORMAL HIGH (ref 70–99)
Glucose-Capillary: 173 mg/dL — ABNORMAL HIGH (ref 70–99)
Glucose-Capillary: 147 mg/dL — ABNORMAL HIGH (ref 70–99)

## 2023-04-07 LAB — URINE CULTURE: Culture: >=100000 — AB

## 2023-04-07 NOTE — TOC CM/SW Note (Signed)
Transition of Care [facility]) - Inpatient Brief Assessment   Patient Details  Name: [name] MRN: [id number] Date of Birth: 19-Jun-1950  Transition of Care [facility] CM/SW Contact:    Larrie Kass, LCSW Phone Number: 04/07/2023, 10:56 AM     Transition of Care Asessment: Insurance and Status: Insurance coverage has been reviewed Patient has primary care physician: Yes Home environment has been reviewed: home with self Prior level of function:: independent Prior/Current Home Services: No current home services Social Determinants of Health Reivew: SDOH reviewed no interventions necessary Readmission risk has been reviewed: Yes Transition of care needs: no transition of care needs at this time

## 2023-04-07 NOTE — Plan of Care (Signed)
  Problem: Activity: Goal: Risk for activity intolerance will decrease Outcome: Progressing   Problem: Pain Management: Goal: General experience of comfort will improve Outcome: Progressing   Problem: Safety: Goal: Ability to remain free from injury will improve Outcome: Progressing   Problem: Coping: Goal: Level of anxiety will decrease Outcome: Not Progressing

## 2023-04-07 NOTE — Progress Notes (Addendum)
Patient Name: [name] Date of Encounter: 04/07/2023 [facility] Cardiologist: Tessa Lerner, DO   Interval Summary  .    Still improving and diuresing well with stable symptoms.   Not complaining of any chest pain Shortness of breath today is better Vital Signs .    Vitals:   04/06/23 1428 04/06/23 2009 04/07/23 0500 04/07/23 0518  BP: 125/65 134/70  135/87  Pulse: 86 90  96  Resp: 18 18  16   Temp: 98.5 F (36.9 C) 98.6 F (37 C)  98.1 F (36.7 C)  TempSrc: Oral Oral  Oral  SpO2: 95% 93%  94%  Weight:   53.3 kg   Height:        Intake/Output Summary (Last 24 hours) at 04/07/2023 0930 Last data filed at 04/07/2023 0820 Gross per 24 hour  Intake 1438.72 ml  Output 1300 ml  Net 138.72 ml      04/07/2023    5:00 AM 04/06/2023   10:14 AM 04/06/2023    5:00 AM  Last 3 Weights  Weight (lbs) 117 lb 8.1 oz 125 lb 14.1 oz 117 lb 15.1 oz  Weight (kg) 53.3 kg 57.1 kg 53.5 kg      Telemetry/ECG    Sinus, heart rates in the 100- Personally Reviewed  CV Studies    Echocardiogram 04/06/2023 1. LV intracavitary gradient of . Left ventricular ejection  fraction, by estimation, is 55 to 60%. The left ventricle has normal  function. The left ventricle has no regional wall motion abnormalities.  There is severe left ventricular hypertrophy.  Left ventricular diastolic parameters are consistent with Grade III  diastolic dysfunction (restrictive). There is the interventricular septum  is flattened in systole and diastole, consistent with right ventricular  pressure and volume overload. The  average left ventricular global longitudinal strain is -5.3 %. The global  longitudinal strain is abnormal.   2. RVSP . Right ventricular systolic function is mildly reduced.  The right ventricular size is moderately enlarged. Mildly increased right  ventricular wall thickness. There is severely elevated pulmonary artery  systolic pressure.   3. Left atrial size  was severely dilated.   4. Right atrial size was severely dilated.   5. The mitral valve is rheumatic. Moderate mitral valve regurgitation.  Severe mitral stenosis. The mean mitral valve gradient is 20.5 mmHg.  Severe mitral annular calcification.   6. The tricuspid valve is degenerative. Tricuspid valve regurgitation is  moderate.   7. Heavily calcified with SVI of 12mL/m2, DVI 0.325 consistent with at  least moderate low flow, low gradient AS. The aortic valve is normal in  structure. There is severe calcifcation of the aortic valve. There is  severe thickening of the aortic valve.  Aortic valve regurgitation is mild to moderate. Moderate to severe aortic  valve stenosis. Aortic valve area, by VTI measures 0.78 cm. Aortic valve  mean gradient measures 16.0 mmHg. Aortic valve Vmax measures 2.71 m/s.   8. Systolic notching of PA doppler waveform consistent with pulmonary  hypertension.   9. The inferior vena cava is normal in size with <50% respiratory  variability, suggesting right atrial pressure of 8 mmHg.   Physical Exam .   GEN: No acute distress.   Neck: mild and improved JVD Cardiac: Tachycardic 3/6 murmur Respiratory: Clear to auscultation bilaterally. GI: Soft, nontender, non-distended  MS: 1-2+ edema  Patient Profile    [name] is a 72 y.o. female has hx of   paroxysmal atrial fibrillation,  mitral stenosis/regurgitation, aortic stenosis, type 2 diabetes, RA, breast cancer status postlumpectomy/chemo/radiation, former smoker, aortic atherosclerosis, hypertension  and admitted on 04/05/2023 for the evaluation of CHF.  Assessment & Plan .     Severe valvular disease  Pulmonary hypertension Moderate MR and Severe MS: mean gradient 20.5 with severe MAC Mild to moderate AR Moderate to severe AS: Heavily calcified with SVI of 18, DVI .325, mean gradient 16. Pulmonary hypertension: RVSP 78 LV intracavitary gradient 21, preserved EF, mildly reduced RV with grade 3  diastolic dysfunction and interventricular septum flattening.  She would opt for surgical intervention if deemed a stable candidate. Very functional at home.  Plan now will be to optimize her from a volume standpoint and have advanced heart failure evaluate her / optimize further and will eventually need right and left heart catheterization once optimized, eventually will need CT consult to provide the patient and family with her options depending on the workup.  Continue home dose of diltiazem 300 mg. Continue IV Lasix 40 mg twice daily Continue compression stocking, elevate legs, mobilize fluids    Paroxysmal atrial fibrillation Previous DCCV April 2023 and still maintaining normal sinus rhythm.  Maintain potassium greater than 4 and magnesium greater than 2.  She is high risk for going into A-fib. Continue IV heparin, INRs have been subtherapeutic Continue diltiazem 300 mg  Type 2 diabetes A1c 9%.  Management per primary team.   For questions or updates, please contact [facility] Please consult [email or web address] for contact info under     Signed, Abagail Kitchens, PA-C    ADDENDUM:   Patient seen and examined with Abagail Kitchens, PA-C .  I personally taken a history, examined the patient, reviewed relevant notes,  laboratory data / imaging studies.  I performed a substantive portion of this encounter and formulated the important aspects of the plan.  I agree with the APP's note, impression, and recommendations; however, I have edited the note to reflect changes or salient points (which are noted in italics).   Remains on room air. Diuresing well. Shortness of breath improving Awaiting transfer to [facility]  PHYSICAL EXAM: Today's Vitals   04/07/23 0541 04/07/23 0843 04/07/23 1157 04/07/23 1344  BP:   126/73   Pulse:   93   Resp:   20   Temp:   (!) 97.5 F (36.4 C)   TempSrc:   Oral   SpO2:   94%   Weight:      Height:      PainSc: 6  4   5     Body mass  index is 20.82 kg/m.   Net IO Since Admission: 317.16 mL [04/07/23 1356]  Filed Weights   04/06/23 0500 04/06/23 1014 04/07/23 0500  Weight: 53.5 kg 57.1 kg 53.3 kg    Physical Exam  Constitutional: No distress.  Age appropriate, hemodynamically stable.   HENT:  Poor oral dentition  Neck: JVD present.  Cardiovascular: Normal rate, regular rhythm, S1 normal and S2 normal. Exam reveals no gallop, no S3 and no S4.  Murmur heard. Systolic murmur is present with a grade of 3/6 at the upper right sternal border. Holosystolic  murmur of grade 3/6 is also present at the apex. Crescendo diastolic murmur is present with a grade of 3/4 at the apex. Pulmonary/Chest: Effort normal. No stridor. She has no wheezes. She has no rales.  Left mastectomy.  Abdominal: Soft. Bowel sounds are normal. She exhibits no distension. There is no abdominal tenderness.  Musculoskeletal:        General: Edema (Bilateral 2+ extending to the thighs laterally) present.     Cervical back: Neck supple.     Comments: TED hoses present  Neurological: She is alert and oriented to person, place, and time. She has intact cranial nerves (2-12).  Skin: Skin is warm and moist.    EKG: (personally reviewed by me) No new EKGs  Telemetry: (personally reviewed by me) Sinus rhythm   Impression:  Acute decompensated heart failure with preserved EF Severe mitral stenosis Paradoxical low-flow low gradient aortic stenosis Paroxysmal atrial fibrillation, valvular Long-term oral anticoagulation Non-insulin-dependent diabetes mellitus type 2. Hypertension. Aortic atherosclerosis  Recommendations:  Acute decompensated heart failure with preserved EF-exacerbated by underlying valvular heart disease Continue IV Lasix. Bilateral compression stockings. I's and O's are not accurately measured-spoke to nursing staff, 57 kg yesterday, 53 kg today Currently on fluid restrictions. Continue home dose Cardizem. Focus on diuresis  for now. When beds are available she be transferred to [facility]. Once transferred to [facility] she will need to be evaluated by advanced heart failure team given the acute decompensated heart failure secondary to valvular heart disease.  Once optimized/euvolemic she would likely need left lower heart catheterization with CT surgery with regards to her options. She will need Panorex - last dental appt 3 months ago.  Patient is agreeable with the plan of care.  Non-insulin-dependent diabetes mellitus type 2: recommend changing metformin to insulin +/- jardiance.   PAF: remains in SR. Continue CCB at home dose. Continue IV Heparin for Afib, hold off on coumadin for now.   Further recommendations to follow as the case evolves.   This note was created using a voice recognition software as a result there may be grammatical errors inadvertently enclosed that do not reflect the nature of this encounter. Every attempt is made to correct such errors.   Tessa Lerner, DO, [facility]  [address] [place], [place] [address] Pager: [phone] Office: [phone] 04/07/2023 1:56 PM

## 2023-04-07 NOTE — Progress Notes (Signed)
HOSPITALIST ROUNDING NOTE SANTOS SCHALLHORN [id number]:[id number]  DOB: 1950/07/03  DOA: 04/05/2023  PCP: Noberto Retort, MD  04/07/2023,9:04 AM   LOS: 2 days      Code Status: Full code From: Home  current Dispo: likely home     71 year old female known rheumatoid arthritis follows with Dr. Narda Amber and Plaquenil  breast cancer X2 2+ lumpectomy chemo radiation-subsequent left mastectomy Paroxysmal A-fib CHADVASC 5 DCCV 07/2021 on  Coumadin 2/2 mitral stenosis mitral regurg?-follows with Dr. Billy Coast  DM TY 2 HTN HLD hypothyroid Prior left ureteric stone with sepsis-admission 2023, 2019 Paroxysmal A-fib Chronic pain on opiates  1-2 week h/o have progressive exertional DOE PND----had been incrementally placed on HCTZ then transitioned to Lasix 20 by cardiologist and cardiology recommended outpatient echo to characterize the valve and heart failure. Got up to go to the restroom 12/4 felt weak dizzy had incontinence of bowel went outside found to be weak - Fever chills other issues-- Sodium 3.0 BUN/creatinine 20/0.8 BNP 1498 troponin 71-->67 WBC 12.5 hemoglobin 9.9 platelet 330 Magnesium 1.2 and replaced A1c 9.0 cx-ray diffuse cardiac enlargement  Echocardiogram 12/5 = grade 3 DD right ventricular pressure and volume overload-rheumatic mitral valve mitral valve gradient 20 severe stenosis-heavily calcified low-flow low gradient AAS with severe calcification of valve and severe thickening moderate to severe aortic valve stenosis valve area 0.78 gradient 16 12/5 cardiology consulted Long discussion still DNR but going to [facility] for advanced heart failure team discussion?  Left right heart cath and discussion about options  PLAN  acute decompensated HF PEF grade 3 DD in setting multiple valve anomalies as above Wght 57-->53 KG, I/o Inaccurate--not takin lasix at home Continue Lasix IV 40 bid now---resume ARB/hydrochlorothiazide when more euvolemic as per AECHF team---going [facility] for  further evaluation when bed available  Syncope likely secondary to progression valvular abnormalities, AoS Telemetry relatively benign heart rate 105, PVCs mainly with alternating sinus Keep on telemetry for now  Paroxysmal A-fib CHADVASC >5 DCCV 2023 on Coumadin Continues on Cardizem 300, as needed metoprolol pushes 2.5 Coumadin held because procedures may be needed continues on heparin  Electrolyte abnormalities Hypokalemia is resolved Continue replacement as per cardiology but is within baseline range Check magnesium with next labs in a.m.  Hypothyroid Continue Synthroid 175  DM TY 2 Still okay at this time for metformin-can continue 2000 XR every afternoon, continue sliding scale, CBG ranging 1 50-1 70 eating 100% of meals  Chronic pain rheumatoid arthritis on Orencia/Plaquenil Continue Plaquenil 200 every afternoon, Orencia on hold Continue MS Contin 30 every 12, oxycodone 7.5 every 6 as needed moderate to severe pain  Depression Continue Wellbutrin 150 XL p.m., Lexapro 10 every afternoon   DVT prophylaxis: Heparin GTT  Status is: Inpatient Remains inpatient appropriate because:   Requires diuresis    Subjective:  Feels fair No shortness of breath Eating drinking no chest pain  Objective + exam Vitals:   04/06/23 1428 04/06/23 2009 04/07/23 0500 04/07/23 0518  BP: 125/65 134/70  135/87  Pulse: 86 90  96  Resp: 18 18  16   Temp: 98.5 F (36.9 C) 98.6 F (37 C)  98.1 F (36.7 C)  TempSrc: Oral Oral  Oral  SpO2: 95% 93%  94%  Weight:   53.3 kg   Height:       Filed Weights   04/06/23 0500 04/06/23 1014 04/07/23 0500  Weight: 53.5 kg 57.1 kg 53.3 kg    Examination:  Alert coherent no icterus no  pallor Harsh holosystolic murmur LL SB with loud first heart sound Decreased air entry posterolaterally but no rales rhonchi No abdominal tenderness Wearing stockings now Neuro intact moving 4 limbs equally  Data Reviewed: reviewed   CBC    Component  Value Date/Time   WBC 8.5 04/07/2023 0432   RBC 3.96 04/07/2023 0432   HGB 9.3 (L) 04/07/2023 0432   HGB 10.5 (L) 01/05/2023 1622   HCT 32.9 (L) 04/07/2023 0432   HCT 33.9 (L) 01/05/2023 1622   PLT 254 04/07/2023 0432   PLT 408 01/05/2023 1622   MCV 83.1 04/07/2023 0432   MCV 85 01/05/2023 1622   MCH 23.5 (L) 04/07/2023 0432   MCHC 28.3 (L) 04/07/2023 0432   RDW 17.5 (H) 04/07/2023 0432   RDW 14.8 01/05/2023 1622   LYMPHSABS 0.9 04/05/2023 1703   MONOABS 0.6 04/05/2023 1703   EOSABS 0.0 04/05/2023 1703   BASOSABS 0.0 04/05/2023 1703      Latest Ref Rng & Units 04/07/2023    4:32 AM 04/06/2023    4:25 AM 04/05/2023    5:03 PM  CMP  Glucose 70 - 99 mg/dL 956  213  086   BUN 8 - 23 mg/dL 24  16  20    Creatinine 0.44 - 1.00 mg/dL 5.78  4.69  6.29   Sodium 135 - 145 mmol/L 136  137  139   Potassium 3.5 - 5.1 mmol/L 3.6  3.5  3.0   Chloride 98 - 111 mmol/L 95  98  99   CO2 22 - 32 mmol/L 25  27  27    Calcium 8.9 - 10.3 mg/dL 8.5  8.6  8.6   Total Protein 6.5 - 8.1 g/dL   6.7   Total Bilirubin <1.2 mg/dL   0.6   Alkaline Phos 38 - 126 U/L   60   AST 15 - 41 U/L   22   ALT 0 - 44 U/L   18      Scheduled Meds:  buPROPion  150 mg Oral QPM   diltiazem  300 mg Oral Daily   escitalopram  10 mg Oral QPM   folic acid  2 mg Oral QPM   furosemide  40 mg Intravenous Q12H   hydroxychloroquine  200 mg Oral QPM   insulin aspart  0-9 Units Subcutaneous TID WC   levothyroxine  175 mcg Oral QPM   metFORMIN  2,000 mg Oral QPM   morphine  30 mg Oral Q12H   pravastatin  40 mg Oral QPM   sodium chloride flush  3 mL Intravenous Q12H   Continuous Infusions:  heparin 950 Units/hr (04/07/23 0258)    Time  46  Rhetta Mura, MD  [facility]

## 2023-04-07 NOTE — Progress Notes (Incomplete)
HOSPITALIST ROUNDING NOTE [name] ZOX:[id number]  DOB: 09/25/1950  DOA: 04/05/2023  PCP: Noberto Retort, MD  04/07/2023,8:52 AM   LOS: 2 days      Code Status: Full code From: Home  current Dispo: likely home     72 year old female known rheumatoid arthritis follows with Dr. Narda Amber and Plaquenil  breast cancer X2 2+ lumpectomy chemo radiation-subsequent left mastectomy Paroxysmal A-fib CHADVASC 5 DCCV 07/2021 on  Coumadin 2/2 mitral stenosis mitral regurg?-follows with Dr. Billy Coast  DM TY 2 HTN HLD hypothyroid Prior left ureteric stone with sepsis-admission 2023, 2019 Paroxysmal A-fib Chronic pain on opiates  1-2 week h/o have progressive exertional DOE PND----had been incrementally placed on HCTZ then transitioned to Lasix 20 by cardiologist and cardiology recommended outpatient echo to characterize the valve and heart failure. Got up to go to the restroom 12/4 felt weak dizzy had incontinence of bowel went outside found to be weak - Fever chills other issues-- Sodium 3.0 BUN/creatinine 20/0.8 BNP 1498 troponin 71-->67 WBC 12.5 hemoglobin 9.9 platelet 330 Magnesium 1.2 and replaced A1c 9.0 cx-ray diffuse cardiac enlargement  Plan  Acute decompensated HFpEF in the setting of mitral regurg I/O even weight inaccurate Not taking Lasix 20 daily here was given 60 in ED IV and now is on 40 IV twice daily-expect will improve over next several days Hold losartan 50 for now and reimplement slowly hold HCTZ 25 follow-up echocardiogram ordered  Syncope?  Vasovagal Could be from hypotension but probably was vasovagal given history Monitor on telemetry tonight and reeval in a.m.  Paroxysmal A-fib CHA2DS2-VASc 5 DCCV 2023/Coumadin Continue Cardizem 300 daily check labs periodically Is on Coumadin for reported mitral regurg-transitioned to heparin GTT while awaiting possible workup etc. likely can go back to Coumadin  Hypokalemia hypomagnesemia Replaced-stop p.o. replacement check  labs including mag in the morning  HTN Med adjustments as above  Hypothyroid Continue Synthroid 175 every afternoon  Breast cancer X2 Status post lumpectomy chemo and mastectomy apparently received radiation-no sequelae of the same  DM TY 2 Reported history and is on metformin-resume metformin XR 2000 every afternoon and can follow CBGs while on sensitive sliding scale  Rheumatoid arthritis/Orencia Plaquenil Chronic pain Continues on Plaquenil 200 every afternoon will resume Orencia in the outpatient setting   DVT prophylaxis: Heparin GTT  Status is: Inpatient Remains inpatient appropriate because:   Requires diuresis    Subjective:  Relates h/o of SOB over several weeks with use of HCTZ and was graduated to Lasix Feels okay now Much less swollen No chest pain No fever  Objective + exam Vitals:   04/06/23 1428 04/06/23 2009 04/07/23 0500 04/07/23 0518  BP: 125/65 134/70  135/87  Pulse: 86 90  96  Resp: 18 18  16   Temp: 98.5 F (36.9 C) 98.6 F (37 C)  98.1 F (36.7 C)  TempSrc: Oral Oral  Oral  SpO2: 95% 93%  94%  Weight:   53.3 kg   Height:       Filed Weights   04/06/23 0500 04/06/23 1014 04/07/23 0500  Weight: 53.5 kg 57.1 kg 53.3 kg    Examination:  EOMI NCAT no focal deficit no icterus no pallor no wheeze colonoscopy cannot appreciate any TVR TVF Some JVD with pressure over right upper quadrant Abdomen is soft otherwise S1-S2 holosystolic murmur LUSB ROM intact moving 4 limbs equally Grade 1 edema  Data Reviewed: reviewed   CBC    Component Value Date/Time   WBC 8.5 04/07/2023 0432  RBC 3.96 04/07/2023 0432   HGB 9.3 (L) 04/07/2023 0432   HGB 10.5 (L) 01/05/2023 1622   HCT 32.9 (L) 04/07/2023 0432   HCT 33.9 (L) 01/05/2023 1622   PLT 254 04/07/2023 0432   PLT 408 01/05/2023 1622   MCV 83.1 04/07/2023 0432   MCV 85 01/05/2023 1622   MCH 23.5 (L) 04/07/2023 0432   MCHC 28.3 (L) 04/07/2023 0432   RDW 17.5 (H) 04/07/2023 0432   RDW  14.8 01/05/2023 1622   LYMPHSABS 0.9 04/05/2023 1703   MONOABS 0.6 04/05/2023 1703   EOSABS 0.0 04/05/2023 1703   BASOSABS 0.0 04/05/2023 1703      Latest Ref Rng & Units 04/07/2023    4:32 AM 04/06/2023    4:25 AM 04/05/2023    5:03 PM  CMP  Glucose 70 - 99 mg/dL 161  096  045   BUN 8 - 23 mg/dL 24  16  20    Creatinine 0.44 - 1.00 mg/dL 4.09  8.11  9.14   Sodium 135 - 145 mmol/L 136  137  139   Potassium 3.5 - 5.1 mmol/L 3.6  3.5  3.0   Chloride 98 - 111 mmol/L 95  98  99   CO2 22 - 32 mmol/L 25  27  27    Calcium 8.9 - 10.3 mg/dL 8.5  8.6  8.6   Total Protein 6.5 - 8.1 g/dL   6.7   Total Bilirubin <1.2 mg/dL   0.6   Alkaline Phos 38 - 126 U/L   60   AST 15 - 41 U/L   22   ALT 0 - 44 U/L   18      Scheduled Meds:  buPROPion  150 mg Oral QPM   diltiazem  300 mg Oral Daily   escitalopram  10 mg Oral QPM   folic acid  2 mg Oral QPM   furosemide  40 mg Intravenous Q12H   hydroxychloroquine  200 mg Oral QPM   insulin aspart  0-9 Units Subcutaneous TID WC   levothyroxine  175 mcg Oral QPM   metFORMIN  2,000 mg Oral QPM   morphine  30 mg Oral Q12H   pravastatin  40 mg Oral QPM   sodium chloride flush  3 mL Intravenous Q12H   Continuous Infusions:  heparin 950 Units/hr (04/07/23 0258)    Time  46  Rhetta Mura, MD  [facility]

## 2023-04-07 NOTE — Progress Notes (Signed)
PHARMACY - ANTICOAGULATION CONSULT NOTE  Pharmacy Consult for heparin Indication: atrial fibrillation  Allergies  Allergen Reactions   Fish Allergy Anaphylaxis and Hives   Amaryl [Glimepiride] Itching   Avalide [Irbesartan-Hydrochlorothiazide] Itching   Avandia [Rosiglitazone] Itching   Byetta 10 Mcg Pen [Exenatide] Itching   Codeine Itching    Other reaction(s): Unknown   Demerol [Meperidine Hcl] Itching   Glucovance [Glyburide-Metformin] Itching   Iodinated Contrast Media Itching    Other reaction(s):itching   Lisinopril Itching   Naprosyn [Naproxen] Hives   Penicillins Itching and Other (See Comments)    Has patient had a PCN reaction causing immediate rash, facial/tongue/throat swelling, SOB or lightheadedness with hypotension: no Has patient had a PCN reaction causing severe rash involving mucus membranes or skin necrosis: no Has patient had a PCN reaction that required hospitalization: yes Has patient had a PCN reaction occurring within the last 10 years: no If all of the above answers are "NO", then may proceed with Cephalosporin use.    Patient Measurements: Height: 5\' 3"  (160 cm) Weight: 53.3 kg (117 lb 8.1 oz) IBW/kg (Calculated) : 52.4 Heparin Dosing Weight: 57.1 kg  Vital Signs: Temp: 98.1 F (36.7 C) (12/06 0518) Temp Source: Oral (12/06 0518) BP: 135/87 (12/06 0518) Pulse Rate: 96 (12/06 0518)  Labs: Recent Labs    04/05/23 1703 04/05/23 1922 04/06/23 0425 04/06/23 1020 04/06/23 2103 04/07/23 0432  HGB 9.9*  --  9.3*  --   --  9.3*  HCT 35.2*  --  32.9*  --   --  32.9*  PLT 330  --  263  --   --  254  LABPROT 16.1*  --   --   --   --   --   INR 1.3*  --   --   --   --   --   HEPARINUNFRC  --   --   --  0.21* 0.49 0.38  CREATININE 0.84  --  0.64  --   --  0.68  TROPONINIHS 71* 67*  --   --   --   --     Estimated Creatinine Clearance: 51.8 mL/min (by C-G formula based on SCr of 0.68 mg/dL).   Medical History: Past Medical History:   Diagnosis Date   Asthma, mild    Intermittent   Atrial fibrillation [facility])    Feb 2023   Cancer [facility])    Chronic back pain    Echocardiogram abnormal 09/2008   With mild aortic valve and mitral valve regurgitation   Heart murmur    History of left breast cancer 1999   Hyperlipidemia    Hypertension    Hypothyroidism    IBS (irritable bowel syndrome)    Lactose intolerance    Osteoporosis    Peptic ulcer disease    rheumatoid Arthritis    Type II diabetes mellitus (HCC)    [facility] 08-2012 checks cbg bid   Ulcer, duodenal, acute, with obstruction 1999   Wears glasses     Medications: Warfarin PTA - currently on hold  Assessment: 72 year old female with history of atrial fibrillation on warfarin PTA. INR subtherapeutic on admission. Warfarin to be held pending rule out need for procedure. Pharmacy consulted for heparin management.  Today, 04/07/23: Heparin level therapeutic now x 2on heparin @950  units/hr Hgb 9.3, plts 254 - stable  No s/sx of bleeding or infusion related concerns reported by RN  Goal of Therapy:  Heparin level 0.3-0.7 units/ml Monitor platelets by anticoagulation protocol:  Yes   Plan:  -Continue heparin infusion at 950 units/hr -Daily CBC and heparin level -Follow up appropriate restart of warfarin   Hessie Knows, PharmD, BCPS Secure Chat if ?s 04/07/2023 9:22 AM

## 2023-04-08 DIAGNOSIS — I5033 Acute on chronic diastolic (congestive) heart failure: Secondary | ICD-10-CM | POA: Diagnosis not present

## 2023-04-08 LAB — HEPARIN LEVEL (UNFRACTIONATED): Heparin Unfractionated: 0.34 [IU]/mL (ref 0.30–0.70)

## 2023-04-08 LAB — CBC
HCT: 31.5 % — ABNORMAL LOW (ref 36.0–46.0)
Hemoglobin: 9.2 g/dL — ABNORMAL LOW (ref 12.0–15.0)
MCH: 23.4 pg — ABNORMAL LOW (ref 26.0–34.0)
MCHC: 29.2 g/dL — ABNORMAL LOW (ref 30.0–36.0)
MCV: 80.2 fL (ref 80.0–100.0)
Platelets: 296 10*3/uL (ref 150–400)
RBC: 3.93 MIL/uL (ref 3.87–5.11)
RDW: 17.2 % — ABNORMAL HIGH (ref 11.5–15.5)
WBC: 11.8 10*3/uL — ABNORMAL HIGH (ref 4.0–10.5)
nRBC: 0 % (ref 0.0–0.2)

## 2023-04-08 LAB — MAGNESIUM
Magnesium: 1.1 mg/dL — ABNORMAL LOW (ref 1.7–2.4)
Magnesium: 1.9 mg/dL (ref 1.7–2.4)

## 2023-04-08 LAB — GLUCOSE, CAPILLARY
Glucose-Capillary: 152 mg/dL — ABNORMAL HIGH (ref 70–99)
Glucose-Capillary: 205 mg/dL — ABNORMAL HIGH (ref 70–99)
Glucose-Capillary: 141 mg/dL — ABNORMAL HIGH (ref 70–99)
Glucose-Capillary: 170 mg/dL — ABNORMAL HIGH (ref 70–99)

## 2023-04-08 LAB — BASIC METABOLIC PANEL
Anion gap: 20 — ABNORMAL HIGH (ref 5–15)
BUN: 33 mg/dL — ABNORMAL HIGH (ref 8–23)
CO2: 23 mmol/L (ref 22–32)
Calcium: 8.8 mg/dL — ABNORMAL LOW (ref 8.9–10.3)
Chloride: 90 mmol/L — ABNORMAL LOW (ref 98–111)
Creatinine, Ser: 1.09 mg/dL — ABNORMAL HIGH (ref 0.44–1.00)
GFR, Estimated: 54 mL/min — ABNORMAL LOW (ref >60)
Glucose, Bld: 151 mg/dL — ABNORMAL HIGH (ref 70–99)
Potassium: 4.1 mmol/L (ref 3.5–5.1)
Sodium: 133 mmol/L — ABNORMAL LOW (ref 135–145)

## 2023-04-08 MED ORDER — METOPROLOL SUCCINATE ER 25 MG PO TB24
25.0000 mg | ORAL_TABLET | Freq: Every day | ORAL | Status: DC
  Administered 2023-04-08: 25 mg via ORAL
  Filled 2023-04-08: qty 1

## 2023-04-08 MED ORDER — SPIRONOLACTONE 12.5 MG HALF TABLET
12.5000 mg | ORAL_TABLET | Freq: Every day | ORAL | Status: DC
  Administered 2023-04-08 – 2023-04-10 (×3): 12.5 mg via ORAL
  Filled 2023-04-08 (×3): qty 1

## 2023-04-08 MED ORDER — MAGNESIUM SULFATE 2 GM/50ML IV SOLN: 2.0000 g | Freq: Once | INTRAVENOUS | Status: DC

## 2023-04-08 MED ORDER — CIPROFLOXACIN HCL 250 MG PO TABS
250.0000 mg | ORAL_TABLET | Freq: Two times a day (BID) | ORAL | Status: DC
  Filled 2023-04-08: qty 1

## 2023-04-08 MED ORDER — CEPHALEXIN 500 MG PO CAPS
500.0000 mg | ORAL_CAPSULE | Freq: Three times a day (TID) | ORAL | Status: DC
  Administered 2023-04-08 – 2023-04-09 (×4): 500 mg via ORAL
  Filled 2023-04-08 (×7): qty 1

## 2023-04-08 MED ORDER — POLYVINYL ALCOHOL 1.4 % OP SOLN: 1.0000 [drp] | OPHTHALMIC | Status: DC | PRN

## 2023-04-08 MED ORDER — MAGNESIUM SULFATE 2 GM/50ML IV SOLN
2.0000 g | INTRAVENOUS | Status: AC
  Administered 2023-04-08: 2 g via INTRAVENOUS
  Filled 2023-04-08: qty 50

## 2023-04-08 MED ORDER — FUROSEMIDE 10 MG/ML IJ SOLN
80.0000 mg | Freq: Two times a day (BID) | INTRAMUSCULAR | Status: DC
  Administered 2023-04-08 – 2023-04-09 (×3): 80 mg via INTRAVENOUS
  Filled 2023-04-08 (×3): qty 8

## 2023-04-08 NOTE — Progress Notes (Signed)
PROGRESS NOTE  [name]  WUJ:[id number] DOB: [birth year] DOA: 04/05/2023 PCP: Noberto Retort, MD   Brief Narrative: Patient is a 72 year old female with history of rheumatoid arthritis, breast cancer status postlumpectomy and chemoradiation with subsequent left mastectomy, paroxysmal A-fib on Coumadin, moderate MR/severe MS, diabetes type 2, hypertension, hyperlipidemia, hypothyroidism who presented with 1 to 2 weeks history of progressive exertional dyspnea, dizziness.  On presentation, lab work showed elevated BUN, mild leukocytosis, hypomagnesemia.  Chest x-ray showed diffuse cardiac enlargement.  Echo showed severe mitral stenosis.  Cardiology consulted.  Currently being managed for acute on chronic diastolic CHF.  On IV Lasix.  Assessment & Plan:  Principal Problem:   Acute on chronic diastolic (congestive) heart failure (HCC) Active Problems:   Rheumatoid arthritis (HCC)   Atrial fibrillation (HCC)   Chronic pain syndrome   DM2 (diabetes mellitus, type 2) (HCC)   HTN (hypertension)   Chronic narcotic use   Acute heart failure with preserved ejection fraction (HFpEF) (HCC)   Mitral valve disease   Mitral valve stenosis and regurgitation   Moderate to severe aortic stenosis   Non-insulin dependent type 2 diabetes mellitus (HCC)   Long term (current) use of anticoagulants  Severe valvular disease: Presented with exertional dyspnea.  Echo shows severe mitral stenosis.  History of mitral regurgitation as well.  Cardiology following.  Current plan is to optimize her volume status then right/left heart cath and eventually CT surgery consultation. Echo also showed severe pulmonary hypertension  Acute diastolic CHF: Elevated BNP on presentation.  Appeared volume overloaded.  Currently on Lasix 40 mg twice twice daily.  Monitor input/output, daily weight.  Still has significant leg edema.  Has compression stockings.  Paroxysmal A-fib: History of DCCV in April 2023.  Currently in  normal sinus rhythm.  On IV heparin for anticoagulation.  Takes Coumadin at home.  Currently also on diltiazem 300 mg daily.  Type 2 diabetes: Recent A1c of 9%.  Continue current insulin regimen.  Monitor blood sugars  Severe hypomagnesemia: Continue to monitor and supplement.  Hypothyroidism: Continue Synthyroid.  Diabetes type 2: On Orencia/Plaquenil.  Plaquenil has been continued here.  Follows with rheumatology  Depression: On Wellbutrin/Lexapro  UTI/leukocytosis: Urine culture showed significant E. coli.  Started on Keflex.  Has mild leukocytosis.  Denies any dysuria today  Normocytic anemia: Current hemoglobin stable in the range of 9.  Chronic pain syndrome: On MS Contin, oxycodone.        Pressure Injury 04/05/23 Buttocks Right;Left Stage 2 -  Partial thickness loss of dermis presenting as a shallow open injury with a red, pink wound bed without slough. (Active)  04/05/23 2200  Location: Buttocks  Location Orientation: Right;Left  Staging: Stage 2 -  Partial thickness loss of dermis presenting as a shallow open injury with a red, pink wound bed without slough.  Wound Description (Comments):   Present on Admission: Yes  Dressing Type Foam - Lift dressing to assess site every shift 04/07/23 1929    DVT prophylaxis:iv heparin     Code Status: Limited: Do not attempt resuscitation (DNR) -DNR-LIMITED -Do Not Intubate/DNI   Family Communication:   Patient status:Inpatient  Patient is from :home  Anticipated discharge NF:AOZH  Estimated DC date: After completion of planned workup, cardiology clearance   Consultants: Cardiology  Procedures: None yet  Antimicrobials:  Anti-infectives (From admission, onward)    Start     Dose/Rate Route Frequency Ordered Stop   04/06/23 1800  hydroxychloroquine (PLAQUENIL) tablet 200 mg  200 mg Oral Every evening 04/06/23 0715         Subjective: Patient seen and examined at bedside today.  She looks comfortable.   Sitting at the edge of the bed.  She is on room air.  Denies any worsening shortness of breath or cough.  Still has significant bilateral lower extremity edema.  Objective: Vitals:   04/07/23 2251 04/07/23 2307 04/08/23 0147 04/08/23 0433  BP:  116/65  (!) 141/84  Pulse:  78  84  Resp: 10 14 13 18   Temp:  98.1 F (36.7 C)  97.6 F (36.4 C)  TempSrc:  Oral  Oral  SpO2:  95%  94%  Weight:    56.6 kg  Height:        Intake/Output Summary (Last 24 hours) at 04/08/2023 0750 Last data filed at 04/08/2023 0441 Gross per 24 hour  Intake 720 ml  Output 500 ml  Net 220 ml   Filed Weights   04/06/23 1014 04/07/23 0500 04/08/23 0433  Weight: 57.1 kg 53.3 kg 56.6 kg    Examination:  General exam: Overall comfortable, not in distress HEENT: PERRL Respiratory system:  no wheezes or crackles  Cardiovascular system: S1 & S2 heard, RRR.  Gastrointestinal system: Abdomen is nondistended, soft and nontender. Central nervous system: Alert and oriented Extremities: Severe bilateral lower extremity pitting edema, no clubbing ,no cyanosis Skin: No rashes, no ulcers,no icterus     Data Reviewed: I have personally reviewed following labs and imaging studies  CBC: Recent Labs  Lab 04/05/23 1703 04/06/23 0425 04/07/23 0432 04/08/23 0207  WBC 12.5* 9.9 8.5 11.8*  NEUTROABS 10.9*  --   --   --   HGB 9.9* 9.3* 9.3* 9.2*  HCT 35.2* 32.9* 32.9* 31.5*  MCV 83.8 83.3 83.1 80.2  PLT 330 263 254 296   Basic Metabolic Panel: Recent Labs  Lab 04/05/23 1703 04/05/23 1922 04/06/23 0425 04/07/23 0432 04/08/23 0207  NA 139  --  137 136 133*  K 3.0*  --  3.5 3.6 4.1  CL 99  --  98 95* 90*  CO2 27  --  27 25 23   GLUCOSE 207*  --  155* 159* 151*  BUN 20  --  16 24* 33*  CREATININE 0.84  --  0.64 0.68 1.09*  CALCIUM 8.6*  --  8.6* 8.5* 8.8*  MG  --  1.2* 1.9  --  1.1*  PHOS  --   --  4.6  --   --      Recent Results (from the past 240 hour(s))  Urine Culture     Status: Abnormal    Collection Time: 04/05/23  5:57 PM   Specimen: Urine, Random  Result Value Ref Range Status   Specimen Description   Final    URINE, RANDOM Performed at [facility], [address] [place], [place] [address]    Special Requests   Final    NONE Reflexed from [id number] Performed at [facility], [address], [place], [place] [address]    Culture >=100,000 COLONIES/mL ESCHERICHIA COLI (A)  Final   Report Status 04/07/2023 FINAL  Final   Organism ID, Bacteria ESCHERICHIA COLI (A)  Final      Susceptibility   Escherichia coli - MIC*    AMPICILLIN <=2 SENSITIVE Sensitive     CEFAZOLIN <=4 SENSITIVE Sensitive     CEFEPIME <=0.12 SENSITIVE Sensitive     CEFTRIAXONE <=0.25 SENSITIVE Sensitive     CIPROFLOXACIN <=  0.25 SENSITIVE Sensitive     GENTAMICIN <=1 SENSITIVE Sensitive     IMIPENEM <=0.25 SENSITIVE Sensitive     NITROFURANTOIN <=16 SENSITIVE Sensitive     TRIMETH/SULFA <=20 SENSITIVE Sensitive     AMPICILLIN/SULBACTAM <=2 SENSITIVE Sensitive     PIP/TAZO <=4 SENSITIVE Sensitive ug/mL    * >=100,000 COLONIES/mL ESCHERICHIA COLI     Radiology Studies: ECHOCARDIOGRAM COMPLETE  Result Date: 04/06/2023    ECHOCARDIOGRAM REPORT   Patient Name:   [name] Date of Exam: 04/06/2023 Medical Rec #:  [id number]        Height:       63.0 in Accession #:    [id number]       Weight:       117.9 lb Date of Birth:  03/08/1951         BSA:          1.545 m Patient Age:    72 years         BP:           160/85 mmHg Patient Gender: F                HR:           110 bpm. Exam Location:  Inpatient Procedure: 2D Echo, Cardiac Doppler, Color Doppler and Strain Analysis Indications:     CHF- Acute Diastolic I50.31  History:         Patient has prior history of Echocardiogram examinations, most                  recent 08/29/2021. CHF, Arrythmias:Atrial Fibrillation; Risk                  Factors:Hypertension, Diabetes and Dyslipidemia.  Sonographer:     Lucendia Herrlich RCS Referring Phys:  [id number] Boyce Medici GONFA Diagnosing Phys: Clearnce Hasten  Sonographer Comments: Image acquisition challenging due to uncooperative patient. Attempted Global Longitudinal Strain IMPRESSIONS  1. LV intracavitary gradient of . Left ventricular ejection fraction, by estimation, is 55 to 60%. The left ventricle has normal function. The left ventricle has no regional wall motion abnormalities. There is severe left ventricular hypertrophy. Left ventricular diastolic parameters are consistent with Grade III diastolic dysfunction (restrictive). There is the interventricular septum is flattened in systole and diastole, consistent with right ventricular pressure and volume overload. The average left ventricular global longitudinal strain is -5.3 %. The global longitudinal strain is abnormal.  2. RVSP . Right ventricular systolic function is mildly reduced. The right ventricular size is moderately enlarged. Mildly increased right ventricular wall thickness. There is severely elevated pulmonary artery systolic pressure.  3. Left atrial size was severely dilated.  4. Right atrial size was severely dilated.  5. The mitral valve is rheumatic. Moderate mitral valve regurgitation. Severe mitral stenosis. The mean mitral valve gradient is 20.5 mmHg. Severe mitral annular calcification.  6. The tricuspid valve is degenerative. Tricuspid valve regurgitation is moderate.  7. Heavily calcified with SVI of 50mL/m2, DVI 0.325 consistent with at least moderate low flow, low gradient AS. The aortic valve is normal in structure. There is severe calcifcation of the aortic valve. There is severe thickening of the aortic valve. Aortic valve regurgitation is mild to moderate. Moderate to severe aortic valve stenosis. Aortic valve area, by VTI measures 0.78 cm. Aortic valve mean gradient measures 16.0 mmHg. Aortic valve Vmax measures 2.71 m/s.  8. Systolic notching of PA doppler waveform consistent with  pulmonary hypertension.  9.  The inferior vena cava is normal in size with <50% respiratory variability, suggesting right atrial pressure of 8 mmHg. FINDINGS  Left Ventricle: LV intracavitary gradient of . Left ventricular ejection fraction, by estimation, is 55 to 60%. The left ventricle has normal function. The left ventricle has no regional wall motion abnormalities. The average left ventricular global longitudinal strain is -5.3 %. The global longitudinal strain is abnormal. The left ventricular internal cavity size was small. There is severe left ventricular hypertrophy. The interventricular septum is flattened in systole and diastole, consistent with right ventricular pressure and volume overload. Left ventricular diastolic parameters are consistent with Grade III diastolic dysfunction (restrictive). Right Ventricle: RVSP . The right ventricular size is moderately enlarged. Mildly increased right ventricular wall thickness. Right ventricular systolic function is mildly reduced. There is severely elevated pulmonary artery systolic pressure. Left Atrium: Left atrial size was severely dilated. Right Atrium: Right atrial size was severely dilated. Pericardium: There is no evidence of pericardial effusion. Mitral Valve: The mitral valve is rheumatic. There is severe thickening of the mitral valve leaflet(s). There is severe calcification of the mitral valve leaflet(s). Moderately decreased mobility of the mitral valve leaflets. Severe mitral annular calcification. Moderate mitral valve regurgitation. Severe mitral valve stenosis. MV peak gradient, 33.4 mmHg. The mean mitral valve gradient is 20.5 mmHg. Tricuspid Valve: The tricuspid valve is degenerative in appearance. Tricuspid valve regurgitation is moderate . No evidence of tricuspid stenosis. Aortic Valve: Heavily calcified with SVI of 34mL/m2, DVI 0.325 consistent with at least moderate low flow, low gradient AS. The aortic valve is normal in  structure. There is severe calcifcation of the aortic valve. There is severe thickening of the aortic valve. Aortic valve regurgitation is mild to moderate. Aortic regurgitation PHT measures 304 msec. Moderate to severe aortic stenosis is present. Aortic valve mean gradient measures 16.0 mmHg. Aortic valve peak gradient measures 29.3 mmHg. Aortic valve area, by VTI measures 0.78 cm. Pulmonic Valve: The pulmonic valve was normal in structure. Pulmonic valve regurgitation is mild. No evidence of pulmonic stenosis. Aorta: The aortic root is normal in size and structure. Pulmonary Artery: Systolic notching of PA doppler waveform consistent with pulmonary hypertension. Venous: The inferior vena cava is normal in size with less than 50% respiratory variability, suggesting right atrial pressure of 8 mmHg. IAS/Shunts: No atrial level shunt detected by color flow Doppler.  LEFT VENTRICLE PLAX 2D LVIDd:         2.80 cm   Diastology LVIDs:         2.13 cm   LV e' medial:    4.20 cm/s LV PW:         1.40 cm   LV E/e' medial:  56.4 LV IVS:        1.20 cm   LV e' lateral:   2.85 cm/s LVOT diam:     1.60 cm   LV E/e' lateral: 83.2 LV SV:         28 LV SV Index:   18        2D Longitudinal Strain LVOT Area:     2.01 cm  2D Strain GLS Avg:     -5.3 %  RIGHT VENTRICLE            IVC RV S prime:     9.11 cm/s  IVC diam: 2.10 cm TAPSE (M-mode): 1.0 cm LEFT ATRIUM              Index  RIGHT ATRIUM           Index LA diam:        5.20 cm  3.37 cm/m   RA Area:     16.50 cm LA Vol (A2C):   120.0 ml 77.67 ml/m  RA Volume:   39.80 ml  25.76 ml/m LA Vol (A4C):   69.0 ml  44.66 ml/m LA Biplane Vol: 99.0 ml  64.08 ml/m  AORTIC VALVE                     PULMONIC VALVE AV Area (Vmax):    0.78 cm      PR End Diast Vel: 6.76 msec AV Area (Vmean):   0.71 cm AV Area (VTI):     0.78 cm AV Vmax:           270.67 cm/s AV Vmean:          184.000 cm/s AV VTI:            0.361 m AV Peak Grad:      29.3 mmHg AV Mean Grad:      16.0 mmHg LVOT  Vmax:         105.00 cm/s LVOT Vmean:        64.900 cm/s LVOT VTI:          0.140 m LVOT/AV VTI ratio: 0.39 AI PHT:            304 msec  AORTA Ao Root diam: 3.00 cm Ao Asc diam:  3.30 cm MITRAL VALVE                TRICUSPID VALVE MV Area (PHT): 6.65 cm     TR Peak grad:   69.6 mmHg MV Area VTI:   0.53 cm     TR Vmax:        417.00 cm/s MV Peak grad:  33.4 mmHg MV Mean grad:  20.5 mmHg    SHUNTS MV Vmax:       2.89 m/s     Systemic VTI:  0.14 m MV Vmean:      214.5 cm/s   Systemic Diam: 1.60 cm MV Decel Time: 114 msec MR Peak grad: 138.1 mmHg MR Vmax:      587.50 cm/s MV E velocity: 237.00 cm/s MV A velocity: 273.00 cm/s MV E/A ratio:  0.87 Clearnce Hasten Electronically signed by Clearnce Hasten Signature Date/Time: 04/06/2023/11:15:17 AM    Final (Updated)     Scheduled Meds:  buPROPion  150 mg Oral QPM   diltiazem  300 mg Oral Daily   escitalopram  10 mg Oral QPM   folic acid  2 mg Oral QPM   furosemide  40 mg Intravenous Q12H   hydroxychloroquine  200 mg Oral QPM   insulin aspart  0-9 Units Subcutaneous TID WC   levothyroxine  175 mcg Oral QPM   metFORMIN  2,000 mg Oral QPM   morphine  30 mg Oral Q12H   pravastatin  40 mg Oral QPM   sodium chloride flush  3 mL Intravenous Q12H   Continuous Infusions:  heparin 950 Units/hr (04/08/23 0358)     LOS: 3 days   Burnadette Pop, MD [facility] P12/10/2022, 7:50 AM

## 2023-04-08 NOTE — Consult Note (Signed)
Advanced Heart Failure Team Consult Note   Primary Physician: Noberto Retort, MD PCP-Cardiologist:  Tessa Lerner, DO  Reason for Consultation: HFpEF w/ RV failure  HPI:    [name] is seen today for evaluation of HFpEF with RV failure/valvular heart disease at the request of [facility]. 72 y.o. female with history of PAF on warfarin, mitral stenosis/regurgitation, aortic valve stenosis, DM II, hx breast cancer twice (initial event 1990s left lumpectomy/chemo/radiation, recurrence 2000s left mastectomy), RA on plaquenil and Orencia, former smoker.   Patient developed atrial fibrillation in 2023, underwent DCCV.  Echo 4/23 showed EF 55-60%, mild LVH, LV small in size, severe LAE, moderate AI/AS with mean gradient of 15 mmHg and AVA of 1.0 cm2, severely calcified mitral valve with moderate MS and severe MR, moderate TR, RVSP 44 mmHg.  She was managed medically at that time.   She called her Cardiologist's (Dr. Odis Hollingshead) office on 03/16/23 with concerns about worsening lower extremity edema and dyspnea and was started on 20 mg po lasix daily.   Patient presented to the [facility] ED on 04/05/23 with complaints of worsening lower extremity edema, dyspnea for 2-3 weeks + episode of near syncope. Reported no improvement in symptoms after recent addition of diuretic. Rhythm was sinus tachycardia 110s-120s. Labs significant for Scr 0.84, K 3.0, Na 139, CO2 27, HS troponin 71>67, BNP 1498, INR 1.3, UA with bacteria/WBCs/leukocytes, now urine culture growing E coli. She was given 60 mg lasix IV and admitted under hospitalist service for management of acute on chronic CHF.   I reviewed echo this admission, on my read it showed EF vigorous 65-70% with underfilled LV, LV intracavitary gradient peak 21 mmHg, moderate LVH, grade III DD, interventricular septum flattened in systole and diastole c/w RV pressure and volume overlad, RV mildly reduced systolic function and mildly dilated, RVSP 78 mmHg,  severe BAE, moderate MR, severe MS with mean gradient of 21 mmHg, moderate-severe low flow low gradient AS with mean gradient of 19 mmHg and AVA 0.78 cm2, mild-moderate AI, moderate TR.   Advanced Heart Failure asked to see to assist with management of HFpEF/RV failure in setting of valvular disease.  Home Medications Prior to Admission medications   Medication Sig Start Date End Date Taking? Authorizing Provider  abatacept (ORENCIA) 250 MG injection Inject 250 mg into the vein every 30 (thirty) days.   Yes [provider]  buPROPion (WELLBUTRIN XL) 150 MG 24 hr tablet Take 150 mg by mouth every evening. 01/31/20  Yes [provider]  denosumab (PROLIA) 60 MG/ML SOSY injection Inject 60 mg into the skin every 6 (six) months.   Yes [provider]  diltiazem (TIADYLT ER) 300 MG 24 hr capsule Take 1 capsule (300 mg total) by mouth daily. 11/07/22  Yes Tolia, Sunit, DO  escitalopram (LEXAPRO) 10 MG tablet Take 10 mg by mouth every evening.   Yes [provider]  esomeprazole (NEXIUM) 20 MG capsule Take 40 mg by mouth every evening.   Yes [provider]  folic acid (FOLVITE) 1 MG tablet Take 2 mg by mouth every evening.   Yes [provider]  hydrochlorothiazide (HYDRODIURIL) 25 MG tablet TAKE 1 TABLET BY MOUTH EVERY DAY IN THE MORNING 12/21/22  Yes Tolia, Sunit, DO  HYDROcodone-acetaminophen (NORCO) 10-325 MG tablet Take 1 tablet by mouth every 6 (six) hours as needed for severe pain (post-operatively). 11/19/21  Yes Manny, Delbert Phenix., MD  hydrocortisone (ANUSOL-HC) 25 MG suppository Place 25  mg rectally 2 (two) times daily as needed for hemorrhoids.    Yes [provider]  hydroxychloroquine (PLAQUENIL) 200 MG tablet Take 200 mg by mouth every evening.   Yes [provider]  levothyroxine (SYNTHROID) 175 MCG tablet Take 175 mcg by mouth every evening. 03/18/21  Yes [provider]  losartan (COZAAR) 50 MG tablet TAKE 1  TABLET BY MOUTH EVERY DAY 12/26/22  Yes Tolia, Sunit, DO  metFORMIN (GLUCOPHAGE-XR) 500 MG 24 hr tablet Take 2,000 mg by mouth every evening.   Yes [provider]  morphine (MS CONTIN) 30 MG 12 hr tablet Take 30 mg by mouth every 12 (twelve) hours. 300 am and 300 pm 07/06/20  Yes [provider]  Multiple Vitamin (MULTIVITAMIN WITH MINERALS) TABS tablet Take 1 tablet by mouth daily.   Yes [provider]  pravastatin (PRAVACHOL) 40 MG tablet Take 40 mg by mouth every evening.   Yes [provider]  warfarin (COUMADIN) 5 MG tablet TAKE 1 TABLET (5 MG TOTAL) BY MOUTH DAILY. Patient taking differently: Take 2.5 mg by mouth daily. 12/22/22  Yes Tolia, Sunit, DO  furosemide (LASIX) 20 MG tablet Take 1 tablet (20 mg total) by mouth daily. Patient not taking: Reported on 04/05/2023 03/17/23 03/16/24  Tessa Lerner, DO    Past Medical History: 1. Type 2 diabetes 2. Atrial fibrillation: Paroxysmal, DCCV in 2023.  3. Breast cancer: On left.  In 1990s had left lumpectomy, radiation, and chemotherapy.  Recurrence in 2000s, had mastectomy on left.  4. Hypothyroidism 5. Rheumatoid arthritis 6. HFpEF with RV failure: In setting of significant valvular heart disease. Echo (12/24) with EF vigorous 65-70% with underfilled LV, LV intracavitary gradient peak 21 mmHg, moderate LVH, grade III DD, interventricular septum flattened in systole and diastole c/w RV pressure and volume overlad, RV mildly reduced systolic function and mildly dilated, RVSP 78 mmHg, severe BAE, moderate MR, severe MS with mean gradient of 21 mmHg, moderate-severe low flow low gradient AS with mean gradient of 19 mmHg and AVA 0.78 cm2, mild-moderate AI, moderate TR.  7. Mitral valve disorder: 12/24 echo with severe MS, moderate MR.  ?Rheumatic vs due to prior radiation.  8. Aortic valve disorder: 12/24 echo with low flow/low gradient moderate-severe AS and mild-moderate AI.  ?Rheumatic vs due to prior radiation.   9. Prior smoker  Past Surgical History: Past Surgical History:  Procedure Laterality Date   ABDOMINAL HYSTERECTOMY     BREAST IMPLANT REMOVAL     Breast implant infected removed implant 06/06 infected and removed 11/05   BREAST LUMPECTOMY Left 12/1997   Left breast   BREAST RECONSTRUCTION     CARDIOVERSION N/A 08/11/2021   Procedure: CARDIOVERSION;  Surgeon: Tessa Lerner, DO;  Location: [facility];  Service: Cardiovascular;  Laterality: N/A;   CHOLECYSTECTOMY     CYSTOSCOPY W/ URETERAL STENT PLACEMENT Left 01/19/2018   Procedure: CYSTOSCOPY WITH RETROGRADE PYELOGRAM/URETERAL STENT PLACEMENT;  Surgeon: Marcine Matar, MD;  Location: [facility];  Service: Urology;  Laterality: Left;   CYSTOSCOPY W/ URETERAL STENT PLACEMENT Right 10/27/2021   Procedure: CYSTOSCOPY WITH RETROGRADE PYELOGRAM/URETERAL STENT PLACEMENT;  Surgeon: Sebastian Ache, MD;  Location: [facility] OR;  Service: Urology;  Laterality: Right;   CYSTOSCOPY WITH RETROGRADE PYELOGRAM, URETEROSCOPY AND STENT PLACEMENT Right 11/19/2021   Procedure: CYSTOSCOPY WITH RETROGRADE PYELOGRAM, URETEROSCOPY AND STENT EXCHANGE;  Surgeon: Sebastian Ache, MD;  Location: [facility];  Service: Urology;  Laterality: Right;  1 HR   CYSTOSCOPY WITH STENT PLACEMENT  10/27/2021  cone   EXTRACORPOREAL SHOCK WAVE LITHOTRIPSY Left 02/15/2018   Procedure: LEFT EXTRACORPOREAL SHOCK WAVE LITHOTRIPSY (ESWL);  Surgeon: Malen Gauze, MD;  Location: [facility];  Service: Urology;  Laterality: Left;   HOLMIUM LASER APPLICATION Right 11/19/2021   Procedure: HOLMIUM LASER APPLICATION;  Surgeon: Sebastian Ache, MD;  Location: [facility];  Service: Urology;  Laterality: Right;   MASTECTOMY  07/2003   Left   other     Left Arm FX   OTHER SURGICAL HISTORY     Hysterectomy- Ovaries intact    OTHER SURGICAL HISTORY     Fracture Left HIp pinning sx done   PARTIAL GASTRECTOMY  1999   due to ulcers, earlier sx done on stomach also    Family History: Family History   Problem Relation Age of Onset   Heart failure Mother    Osteoporosis Mother    Heart attack Father    Hypertension Father    Diabetes Father    Diabetes Mellitus II Father    Diabetes Sister    Thyroid cancer Sister    Diabetes Brother    Colon cancer Neg Hx    Esophageal cancer Neg Hx    Stomach cancer Neg Hx     Social History: Social History   Socioeconomic History   Marital status: Single    Spouse name: Not on file   Number of children: 2   Years of education: Not on file   Highest education level: Not on file  Occupational History   Not on file  Tobacco Use   Smoking status: Former    Current packs/day: 0.00    Average packs/day: 1 pack/day for 5.0 years (5.0 ttl pk-yrs)    Types: Cigarettes    Start date: 05/13/1995    Quit date: 05/12/2000    Years since quitting: 22.9   Smokeless tobacco: Never  Vaping Use   Vaping status: Never Used  Substance and Sexual Activity   Alcohol use: No   Drug use: No   Sexual activity: Not on file  Other Topics Concern   Not on file  Social History Narrative   Not on file   Social Determinants of Health   Financial Resource Strain: Not on file  Food Insecurity: No Food Insecurity (04/05/2023)   Hunger Vital Sign    Worried About Running Out of Food in the Last Year: Never true    Ran Out of Food in the Last Year: Never true  Transportation Needs: No Transportation Needs (04/05/2023)   PRAPARE - Administrator, Civil Service (Medical): No    Lack of Transportation (Non-Medical): No  Physical Activity: Not on file  Stress: Not on file  Social Connections: Not on file    Allergies:  Allergies  Allergen Reactions   Fish Allergy Anaphylaxis and Hives   Amaryl [Glimepiride] Itching   Avalide [Irbesartan-Hydrochlorothiazide] Itching   Avandia [Rosiglitazone] Itching   Byetta 10 Mcg Pen [Exenatide] Itching   Codeine Itching    Other reaction(s): Unknown   Demerol [Meperidine Hcl] Itching   Glucovance  [Glyburide-Metformin] Itching   Iodinated Contrast Media Itching    Other reaction(s):itching   Lisinopril Itching   Naprosyn [Naproxen] Hives   Penicillins Itching and Other (See Comments)    Has patient had a PCN reaction causing immediate rash, facial/tongue/throat swelling, SOB or lightheadedness with hypotension: no Has patient had a PCN reaction causing severe rash involving mucus membranes or skin necrosis: no Has patient had a PCN reaction that  required hospitalization: yes Has patient had a PCN reaction occurring within the last 10 years: no If all of the above answers are "NO", then may proceed with Cephalosporin use.    Objective:    Vital Signs:   Temp:  [97.5 F (36.4 C)-98.1 F (36.7 C)] 97.6 F (36.4 C) (12/07 0433) Pulse Rate:  [78-93] 87 (12/07 0814) Resp:  [10-20] 16 (12/07 0814) BP: (115-141)/(52-97) 138/75 (12/07 0814) SpO2:  [93 %-95 %] 94 % (12/07 0433) Weight:  [56.6 kg] 56.6 kg (12/07 0433) Last BM Date : 04/07/23  Weight change: Filed Weights   04/06/23 1014 04/07/23 0500 04/08/23 0433  Weight: 57.1 kg 53.3 kg 56.6 kg    Intake/Output:   Intake/Output Summary (Last 24 hours) at 04/08/2023 1135 Last data filed at 04/08/2023 0959 Gross per 24 hour  Intake 780 ml  Output 500 ml  Net 280 ml      Physical Exam    General:  Well appearing. No resp difficulty HEENT: normal Neck: supple. JVP 14 cm. Carotids 2+ bilat; no bruits. No lymphadenopathy or thyromegaly appreciated. Cor: PMI nondisplaced. Regular rate & rhythm. 3/6 SEM RUSB, S2 is clearly heard.  Lungs: clear Abdomen: soft, nontender, nondistended. No hepatosplenomegaly. No bruits or masses. Good bowel sounds. Extremities: no cyanosis, clubbing, rash. 2+ edema to knees.  Neuro: alert & orientedx3, cranial nerves grossly intact. moves all 4 extremities w/o difficulty. Affect pleasant   Telemetry   NSR 90s (personally reviewed)  EKG    NSR, right axis deviation (personally  reviewed)  Labs   Basic Metabolic Panel: Recent Labs  Lab 04/05/23 1703 04/05/23 1922 04/06/23 0425 04/07/23 0432 04/08/23 0207 04/08/23 0805  NA 139  --  137 136 133*  --   K 3.0*  --  3.5 3.6 4.1  --   CL 99  --  98 95* 90*  --   CO2 27  --  27 25 23   --   GLUCOSE 207*  --  155* 159* 151*  --   BUN 20  --  16 24* 33*  --   CREATININE 0.84  --  0.64 0.68 1.09*  --   CALCIUM 8.6*  --  8.6* 8.5* 8.8*  --   MG  --  1.2* 1.9  --  1.1* 1.9  PHOS  --   --  4.6  --   --   --     Liver Function Tests: Recent Labs  Lab 04/05/23 1703 04/06/23 0425  AST 22  --   ALT 18  --   ALKPHOS 60  --   BILITOT 0.6  --   PROT 6.7  --   ALBUMIN 3.3* 3.2*   No results for input(s): "LIPASE", "AMYLASE" in the last 168 hours. No results for input(s): "AMMONIA" in the last 168 hours.  CBC: Recent Labs  Lab 04/05/23 1703 04/06/23 0425 04/07/23 0432 04/08/23 0207  WBC 12.5* 9.9 8.5 11.8*  NEUTROABS 10.9*  --   --   --   HGB 9.9* 9.3* 9.3* 9.2*  HCT 35.2* 32.9* 32.9* 31.5*  MCV 83.8 83.3 83.1 80.2  PLT 330 263 254 296    Cardiac Enzymes: No results for input(s): "CKTOTAL", "CKMB", "CKMBINDEX", "TROPONINI" in the last 168 hours.  BNP: BNP (last 3 results) Recent Labs    04/05/23 1703  BNP 1,498.3*    ProBNP (last 3 results) No results for input(s): "PROBNP" in the last 8760 hours.   CBG: Recent Labs  Lab 04/07/23 [phone]  04/07/23 1100 04/07/23 1604 04/07/23 2134 04/08/23 0617  GLUCAP 155* 128* 173* 147* 152*    Coagulation Studies: Recent Labs    04/05/23 1703  LABPROT 16.1*  INR 1.3*     Imaging   No results found.   Medications:     Current Medications:  buPROPion  150 mg Oral QPM   ciprofloxacin  250 mg Oral BID   diltiazem  300 mg Oral Daily   escitalopram  10 mg Oral QPM   folic acid  2 mg Oral QPM   furosemide  80 mg Intravenous BID   hydroxychloroquine  200 mg Oral QPM   insulin aspart  0-9 Units Subcutaneous TID WC   levothyroxine  175  mcg Oral QPM   metoprolol succinate  25 mg Oral Daily   morphine  30 mg Oral Q12H   pravastatin  40 mg Oral QPM   sodium chloride flush  3 mL Intravenous Q12H   spironolactone  12.5 mg Oral Daily    Infusions:  heparin 950 Units/hr (04/08/23 0358)      Patient Profile   72 y.o. female with history of PAF on warfarin, mitral stenosis/regurgitation, aortic valve stenosis, DM II, hx breast cancer x 2 (initial event lumpectomy/chemo/radiation, recurrence left mastectomy), DM II, RA on plaquenil and Orencia, former smoker, admitted w/ acute HFpEF. Echo w/ normal LVEF and evidence of RV failure in setting of severe MS/MR and mod-severe AS. Transferred from [facility] for AHF and CT surgical team consults.   Assessment/Plan   1. Acute HFpEF w/prominent RV dysfunction and valvular heart disease: I reviewed this admission's echo, on my read it showed EF vigorous 65-70% with underfilled LV, LV intracavitary gradient peak 21 mmHg, moderate LVH, grade III DD, interventricular septum flattened in systole and diastole c/w RV pressure and volume overlad, RV mildly reduced systolic function and mildly dilated, RVSP 78 mmHg, severe BAE, moderate MR, severe MS with mean gradient of 21 mmHg, moderate-severe paradoxical low flow low gradient AS with mean gradient of 19 mmHg and AVA 0.78 cm2, mild-moderate AI, moderate TR.  Echo from 4/23 also showed valvular disease.  She is volume overloaded on exam with NYHA class IIIb symptoms at home. Needs full diuresis followed by RHC/LHC.  - Increase Lasix to 80 mg IV bid.  - Add spironolactone 12.5 daily.  - No SGLT2 inhibitor with UTI.  3. Valvular heart disease: She has severe mitral stenosis with moderate MR and a heavily calcified MV.  There is moderate-severe paradoxical low flow/low gradient AS with mean gradient 19 mmHg, AVA 0.79 cm^2.  She also has moderate TR.  Etiology is possibly rheumatic versus due to prior left-sided radiation for breast cancer in 1990s versus  a combination. Her valvular disease is quite severe.  - She will need TEE to more closely assess the valves, ?Monday.  - She will need left/right heart catheterization after volume optimization.  - She will need TCTS consult after the above.  Ideally would have MV and aortic valve replacements, tricuspid valve repair, and Maze with LA appendage clipping.  - If she has cardiac surgery, I can foresee potential problems with her hyperdynamic LV leading to underfilling and hypotension post-operatively.  Will continue to use diltiazem CD and will add Toprol XL 25 mg daily to try to decrease contractility.  - if deemed too high risk for surgery will need to consider palliative care   3. Atrial fibrillation: Paroxysmal.  In setting of severe mitral valve disease (stenosis/regurgitation).  Severe biatrial enlargement  on echo.  She is in NSR currently.  - Heparin gtt for now, was on warfarin at home.  - If planned for surgery, would start amiodarone pre-op given high risk for post-op atrial fibrillation.  4. RA: On Orencia/Plaquenil  5. Hypothyroidism - on Synthroid 175  6. Type 2DM:  Poorly controlled, Hgb A1c 9.0.  - Will stop metformin for now.  - SSI  - No SGLT2 inhibitor with UTI.  7. UTI: Urine culture has grown E coli.  She is allergic to PCNs, probably not cephalosporin candidate.  - Start Cipro 250 mg bid pending sensitivities.   Length of Stay: 3  Marca Ancona, MD  04/08/2023, 11:35 AM  Advanced Heart Failure Team Pager [phone] (M-F; 7a - 5p)  Please contact [facility] for night-coverage after hours (4p -7a ) and weekends on [email or web address]

## 2023-04-08 NOTE — Progress Notes (Signed)
Mobility Specialist Progress Note    04/08/23 1345  Mobility  Activity Ambulated with assistance in hallway  Level of Assistance Contact guard assist, steadying assist  Assistive Device Front wheel walker  Distance Ambulated (ft) 150 ft  Activity Response Tolerated well  Mobility Referral Yes  Mobility visit 1 Mobility  Mobility Specialist Start Time (ACUTE ONLY) 1334  Mobility Specialist Stop Time (ACUTE ONLY) 1345  Mobility Specialist Time Calculation (min) (ACUTE ONLY) 11 min   Pre-Mobility: 87 HR Post-Mobility: 83 HR  Pt received sitting EOB and agreeable. No complaints on walk. Returned to sitting EOB with call bell in reach.   Fletcher Nation Mobility Specialist  Please Neurosurgeon or Rehab Office at [phone]

## 2023-04-08 NOTE — Progress Notes (Signed)
Dr Janalyn Shy with [facility] was advised this morning of patients Magnesium resulting 1.1. Waiting for further orders. Patient asymptomatic.

## 2023-04-08 NOTE — H&P (View-Only) (Signed)
Advanced Heart Failure Team Consult Note   Primary Physician: Noberto Retort, MD PCP-Cardiologist:  Tessa Lerner, DO  Reason for Consultation: HFpEF w/ RV failure  HPI:    [name] is seen today for evaluation of HFpEF with RV failure/valvular heart disease at the request of [facility]. 72 y.o. female with history of PAF on warfarin, mitral stenosis/regurgitation, aortic valve stenosis, DM II, hx breast cancer twice (initial event 1990s left lumpectomy/chemo/radiation, recurrence 2000s left mastectomy), RA on plaquenil and Orencia, former smoker.   Patient developed atrial fibrillation in 2023, underwent DCCV.  Echo 4/23 showed EF 55-60%, mild LVH, LV small in size, severe LAE, moderate AI/AS with mean gradient of 15 mmHg and AVA of 1.0 cm2, severely calcified mitral valve with moderate MS and severe MR, moderate TR, RVSP 44 mmHg.  She was managed medically at that time.   She called her Cardiologist's (Dr. Odis Hollingshead) office on 03/16/23 with concerns about worsening lower extremity edema and dyspnea and was started on 20 mg po lasix daily.   Patient presented to the [facility] ED on 04/05/23 with complaints of worsening lower extremity edema, dyspnea for 2-3 weeks + episode of near syncope. Reported no improvement in symptoms after recent addition of diuretic. Rhythm was sinus tachycardia 110s-120s. Labs significant for Scr 0.84, K 3.0, Na 139, CO2 27, HS troponin 71>67, BNP 1498, INR 1.3, UA with bacteria/WBCs/leukocytes, now urine culture growing E coli. She was given 60 mg lasix IV and admitted under hospitalist service for management of acute on chronic CHF.   I reviewed echo this admission, on my read it showed EF vigorous 65-70% with underfilled LV, LV intracavitary gradient peak 21 mmHg, moderate LVH, grade III DD, interventricular septum flattened in systole and diastole c/w RV pressure and volume overlad, RV mildly reduced systolic function and mildly dilated, RVSP 78 mmHg,  severe BAE, moderate MR, severe MS with mean gradient of 21 mmHg, moderate-severe low flow low gradient AS with mean gradient of 19 mmHg and AVA 0.78 cm2, mild-moderate AI, moderate TR.   Advanced Heart Failure asked to see to assist with management of HFpEF/RV failure in setting of valvular disease.  Home Medications Prior to Admission medications   Medication Sig Start Date End Date Taking? Authorizing Provider  abatacept (ORENCIA) 250 MG injection Inject 250 mg into the vein every 30 (thirty) days.   Yes [provider]  buPROPion (WELLBUTRIN XL) 150 MG 24 hr tablet Take 150 mg by mouth every evening. 01/31/20  Yes [provider]  denosumab (PROLIA) 60 MG/ML SOSY injection Inject 60 mg into the skin every 6 (six) months.   Yes [provider]  diltiazem (TIADYLT ER) 300 MG 24 hr capsule Take 1 capsule (300 mg total) by mouth daily. 11/07/22  Yes Tolia, Sunit, DO  escitalopram (LEXAPRO) 10 MG tablet Take 10 mg by mouth every evening.   Yes [provider]  esomeprazole (NEXIUM) 20 MG capsule Take 40 mg by mouth every evening.   Yes [provider]  folic acid (FOLVITE) 1 MG tablet Take 2 mg by mouth every evening.   Yes [provider]  hydrochlorothiazide (HYDRODIURIL) 25 MG tablet TAKE 1 TABLET BY MOUTH EVERY DAY IN THE MORNING 12/21/22  Yes Tolia, Sunit, DO  HYDROcodone-acetaminophen (NORCO) 10-325 MG tablet Take 1 tablet by mouth every 6 (six) hours as needed for severe pain (post-operatively). 11/19/21  Yes Manny, Delbert Phenix., MD  hydrocortisone (ANUSOL-HC) 25 MG suppository Place 25  mg rectally 2 (two) times daily as needed for hemorrhoids.    Yes [provider]  hydroxychloroquine (PLAQUENIL) 200 MG tablet Take 200 mg by mouth every evening.   Yes [provider]  levothyroxine (SYNTHROID) 175 MCG tablet Take 175 mcg by mouth every evening. 03/18/21  Yes [provider]  losartan (COZAAR) 50 MG tablet TAKE 1  TABLET BY MOUTH EVERY DAY 12/26/22  Yes Tolia, Sunit, DO  metFORMIN (GLUCOPHAGE-XR) 500 MG 24 hr tablet Take 2,000 mg by mouth every evening.   Yes [provider]  morphine (MS CONTIN) 30 MG 12 hr tablet Take 30 mg by mouth every 12 (twelve) hours. 300 am and 300 pm 07/06/20  Yes [provider]  Multiple Vitamin (MULTIVITAMIN WITH MINERALS) TABS tablet Take 1 tablet by mouth daily.   Yes [provider]  pravastatin (PRAVACHOL) 40 MG tablet Take 40 mg by mouth every evening.   Yes [provider]  warfarin (COUMADIN) 5 MG tablet TAKE 1 TABLET (5 MG TOTAL) BY MOUTH DAILY. Patient taking differently: Take 2.5 mg by mouth daily. 12/22/22  Yes Tolia, Sunit, DO  furosemide (LASIX) 20 MG tablet Take 1 tablet (20 mg total) by mouth daily. Patient not taking: Reported on 04/05/2023 03/17/23 03/16/24  Tessa Lerner, DO    Past Medical History: 1. Type 2 diabetes 2. Atrial fibrillation: Paroxysmal, DCCV in 2023.  3. Breast cancer: On left.  In 1990s had left lumpectomy, radiation, and chemotherapy.  Recurrence in 2000s, had mastectomy on left.  4. Hypothyroidism 5. Rheumatoid arthritis 6. HFpEF with RV failure: In setting of significant valvular heart disease. Echo (12/24) with EF vigorous 65-70% with underfilled LV, LV intracavitary gradient peak 21 mmHg, moderate LVH, grade III DD, interventricular septum flattened in systole and diastole c/w RV pressure and volume overlad, RV mildly reduced systolic function and mildly dilated, RVSP 78 mmHg, severe BAE, moderate MR, severe MS with mean gradient of 21 mmHg, moderate-severe low flow low gradient AS with mean gradient of 19 mmHg and AVA 0.78 cm2, mild-moderate AI, moderate TR.  7. Mitral valve disorder: 12/24 echo with severe MS, moderate MR.  ?Rheumatic vs due to prior radiation.  8. Aortic valve disorder: 12/24 echo with low flow/low gradient moderate-severe AS and mild-moderate AI.  ?Rheumatic vs due to prior radiation.   9. Prior smoker  Past Surgical History: Past Surgical History:  Procedure Laterality Date   ABDOMINAL HYSTERECTOMY     BREAST IMPLANT REMOVAL     Breast implant infected removed implant 06/06 infected and removed 11/05   BREAST LUMPECTOMY Left 12/1997   Left breast   BREAST RECONSTRUCTION     CARDIOVERSION N/A 08/11/2021   Procedure: CARDIOVERSION;  Surgeon: Tessa Lerner, DO;  Location: [facility];  Service: Cardiovascular;  Laterality: N/A;   CHOLECYSTECTOMY     CYSTOSCOPY W/ URETERAL STENT PLACEMENT Left 01/19/2018   Procedure: CYSTOSCOPY WITH RETROGRADE PYELOGRAM/URETERAL STENT PLACEMENT;  Surgeon: Marcine Matar, MD;  Location: [facility];  Service: Urology;  Laterality: Left;   CYSTOSCOPY W/ URETERAL STENT PLACEMENT Right 10/27/2021   Procedure: CYSTOSCOPY WITH RETROGRADE PYELOGRAM/URETERAL STENT PLACEMENT;  Surgeon: Sebastian Ache, MD;  Location: [facility] OR;  Service: Urology;  Laterality: Right;   CYSTOSCOPY WITH RETROGRADE PYELOGRAM, URETEROSCOPY AND STENT PLACEMENT Right 11/19/2021   Procedure: CYSTOSCOPY WITH RETROGRADE PYELOGRAM, URETEROSCOPY AND STENT EXCHANGE;  Surgeon: Sebastian Ache, MD;  Location: [facility];  Service: Urology;  Laterality: Right;  1 HR   CYSTOSCOPY WITH STENT PLACEMENT  10/27/2021  cone   EXTRACORPOREAL SHOCK WAVE LITHOTRIPSY Left 02/15/2018   Procedure: LEFT EXTRACORPOREAL SHOCK WAVE LITHOTRIPSY (ESWL);  Surgeon: Malen Gauze, MD;  Location: [facility];  Service: Urology;  Laterality: Left;   HOLMIUM LASER APPLICATION Right 11/19/2021   Procedure: HOLMIUM LASER APPLICATION;  Surgeon: Sebastian Ache, MD;  Location: [facility];  Service: Urology;  Laterality: Right;   MASTECTOMY  07/2003   Left   other     Left Arm FX   OTHER SURGICAL HISTORY     Hysterectomy- Ovaries intact    OTHER SURGICAL HISTORY     Fracture Left HIp pinning sx done   PARTIAL GASTRECTOMY  1999   due to ulcers, earlier sx done on stomach also    Family History: Family History   Problem Relation Age of Onset   Heart failure Mother    Osteoporosis Mother    Heart attack Father    Hypertension Father    Diabetes Father    Diabetes Mellitus II Father    Diabetes Sister    Thyroid cancer Sister    Diabetes Brother    Colon cancer Neg Hx    Esophageal cancer Neg Hx    Stomach cancer Neg Hx     Social History: Social History   Socioeconomic History   Marital status: Single    Spouse name: Not on file   Number of children: 2   Years of education: Not on file   Highest education level: Not on file  Occupational History   Not on file  Tobacco Use   Smoking status: Former    Current packs/day: 0.00    Average packs/day: 1 pack/day for 5.0 years (5.0 ttl pk-yrs)    Types: Cigarettes    Start date: 05/13/1995    Quit date: 05/12/2000    Years since quitting: 22.9   Smokeless tobacco: Never  Vaping Use   Vaping status: Never Used  Substance and Sexual Activity   Alcohol use: No   Drug use: No   Sexual activity: Not on file  Other Topics Concern   Not on file  Social History Narrative   Not on file   Social Determinants of Health   Financial Resource Strain: Not on file  Food Insecurity: No Food Insecurity (04/05/2023)   Hunger Vital Sign    Worried About Running Out of Food in the Last Year: Never true    Ran Out of Food in the Last Year: Never true  Transportation Needs: No Transportation Needs (04/05/2023)   PRAPARE - Administrator, Civil Service (Medical): No    Lack of Transportation (Non-Medical): No  Physical Activity: Not on file  Stress: Not on file  Social Connections: Not on file    Allergies:  Allergies  Allergen Reactions   Fish Allergy Anaphylaxis and Hives   Amaryl [Glimepiride] Itching   Avalide [Irbesartan-Hydrochlorothiazide] Itching   Avandia [Rosiglitazone] Itching   Byetta 10 Mcg Pen [Exenatide] Itching   Codeine Itching    Other reaction(s): Unknown   Demerol [Meperidine Hcl] Itching   Glucovance  [Glyburide-Metformin] Itching   Iodinated Contrast Media Itching    Other reaction(s):itching   Lisinopril Itching   Naprosyn [Naproxen] Hives   Penicillins Itching and Other (See Comments)    Has patient had a PCN reaction causing immediate rash, facial/tongue/throat swelling, SOB or lightheadedness with hypotension: no Has patient had a PCN reaction causing severe rash involving mucus membranes or skin necrosis: no Has patient had a PCN reaction that  required hospitalization: yes Has patient had a PCN reaction occurring within the last 10 years: no If all of the above answers are "NO", then may proceed with Cephalosporin use.    Objective:    Vital Signs:   Temp:  [97.5 F (36.4 C)-98.1 F (36.7 C)] 97.6 F (36.4 C) (12/07 0433) Pulse Rate:  [78-93] 87 (12/07 0814) Resp:  [10-20] 16 (12/07 0814) BP: (115-141)/(52-97) 138/75 (12/07 0814) SpO2:  [93 %-95 %] 94 % (12/07 0433) Weight:  [56.6 kg] 56.6 kg (12/07 0433) Last BM Date : 04/07/23  Weight change: Filed Weights   04/06/23 1014 04/07/23 0500 04/08/23 0433  Weight: 57.1 kg 53.3 kg 56.6 kg    Intake/Output:   Intake/Output Summary (Last 24 hours) at 04/08/2023 1135 Last data filed at 04/08/2023 0959 Gross per 24 hour  Intake 780 ml  Output 500 ml  Net 280 ml      Physical Exam    General:  Well appearing. No resp difficulty HEENT: normal Neck: supple. JVP 14 cm. Carotids 2+ bilat; no bruits. No lymphadenopathy or thyromegaly appreciated. Cor: PMI nondisplaced. Regular rate & rhythm. 3/6 SEM RUSB, S2 is clearly heard.  Lungs: clear Abdomen: soft, nontender, nondistended. No hepatosplenomegaly. No bruits or masses. Good bowel sounds. Extremities: no cyanosis, clubbing, rash. 2+ edema to knees.  Neuro: alert & orientedx3, cranial nerves grossly intact. moves all 4 extremities w/o difficulty. Affect pleasant   Telemetry   NSR 90s (personally reviewed)  EKG    NSR, right axis deviation (personally  reviewed)  Labs   Basic Metabolic Panel: Recent Labs  Lab 04/05/23 1703 04/05/23 1922 04/06/23 0425 04/07/23 0432 04/08/23 0207 04/08/23 0805  NA 139  --  137 136 133*  --   K 3.0*  --  3.5 3.6 4.1  --   CL 99  --  98 95* 90*  --   CO2 27  --  27 25 23   --   GLUCOSE 207*  --  155* 159* 151*  --   BUN 20  --  16 24* 33*  --   CREATININE 0.84  --  0.64 0.68 1.09*  --   CALCIUM 8.6*  --  8.6* 8.5* 8.8*  --   MG  --  1.2* 1.9  --  1.1* 1.9  PHOS  --   --  4.6  --   --   --     Liver Function Tests: Recent Labs  Lab 04/05/23 1703 04/06/23 0425  AST 22  --   ALT 18  --   ALKPHOS 60  --   BILITOT 0.6  --   PROT 6.7  --   ALBUMIN 3.3* 3.2*   No results for input(s): "LIPASE", "AMYLASE" in the last 168 hours. No results for input(s): "AMMONIA" in the last 168 hours.  CBC: Recent Labs  Lab 04/05/23 1703 04/06/23 0425 04/07/23 0432 04/08/23 0207  WBC 12.5* 9.9 8.5 11.8*  NEUTROABS 10.9*  --   --   --   HGB 9.9* 9.3* 9.3* 9.2*  HCT 35.2* 32.9* 32.9* 31.5*  MCV 83.8 83.3 83.1 80.2  PLT 330 263 254 296    Cardiac Enzymes: No results for input(s): "CKTOTAL", "CKMB", "CKMBINDEX", "TROPONINI" in the last 168 hours.  BNP: BNP (last 3 results) Recent Labs    04/05/23 1703  BNP 1,498.3*    ProBNP (last 3 results) No results for input(s): "PROBNP" in the last 8760 hours.   CBG: Recent Labs  Lab 04/07/23 [phone]  04/07/23 1100 04/07/23 1604 04/07/23 2134 04/08/23 0617  GLUCAP 155* 128* 173* 147* 152*    Coagulation Studies: Recent Labs    04/05/23 1703  LABPROT 16.1*  INR 1.3*     Imaging   No results found.   Medications:     Current Medications:  buPROPion  150 mg Oral QPM   ciprofloxacin  250 mg Oral BID   diltiazem  300 mg Oral Daily   escitalopram  10 mg Oral QPM   folic acid  2 mg Oral QPM   furosemide  80 mg Intravenous BID   hydroxychloroquine  200 mg Oral QPM   insulin aspart  0-9 Units Subcutaneous TID WC   levothyroxine  175  mcg Oral QPM   metoprolol succinate  25 mg Oral Daily   morphine  30 mg Oral Q12H   pravastatin  40 mg Oral QPM   sodium chloride flush  3 mL Intravenous Q12H   spironolactone  12.5 mg Oral Daily    Infusions:  heparin 950 Units/hr (04/08/23 0358)      Patient Profile   72 y.o. female with history of PAF on warfarin, mitral stenosis/regurgitation, aortic valve stenosis, DM II, hx breast cancer x 2 (initial event lumpectomy/chemo/radiation, recurrence left mastectomy), DM II, RA on plaquenil and Orencia, former smoker, admitted w/ acute HFpEF. Echo w/ normal LVEF and evidence of RV failure in setting of severe MS/MR and mod-severe AS. Transferred from [facility] for AHF and CT surgical team consults.   Assessment/Plan   1. Acute HFpEF w/prominent RV dysfunction and valvular heart disease: I reviewed this admission's echo, on my read it showed EF vigorous 65-70% with underfilled LV, LV intracavitary gradient peak 21 mmHg, moderate LVH, grade III DD, interventricular septum flattened in systole and diastole c/w RV pressure and volume overlad, RV mildly reduced systolic function and mildly dilated, RVSP 78 mmHg, severe BAE, moderate MR, severe MS with mean gradient of 21 mmHg, moderate-severe paradoxical low flow low gradient AS with mean gradient of 19 mmHg and AVA 0.78 cm2, mild-moderate AI, moderate TR.  Echo from 4/23 also showed valvular disease.  She is volume overloaded on exam with NYHA class IIIb symptoms at home. Needs full diuresis followed by RHC/LHC.  - Increase Lasix to 80 mg IV bid.  - Add spironolactone 12.5 daily.  - No SGLT2 inhibitor with UTI.  3. Valvular heart disease: She has severe mitral stenosis with moderate MR and a heavily calcified MV.  There is moderate-severe paradoxical low flow/low gradient AS with mean gradient 19 mmHg, AVA 0.79 cm^2.  She also has moderate TR.  Etiology is possibly rheumatic versus due to prior left-sided radiation for breast cancer in 1990s versus  a combination. Her valvular disease is quite severe.  - She will need TEE to more closely assess the valves, ?Monday.  - She will need left/right heart catheterization after volume optimization.  - She will need TCTS consult after the above.  Ideally would have MV and aortic valve replacements, tricuspid valve repair, and Maze with LA appendage clipping.  - If she has cardiac surgery, I can foresee potential problems with her hyperdynamic LV leading to underfilling and hypotension post-operatively.  Will continue to use diltiazem CD and will add Toprol XL 25 mg daily to try to decrease contractility.  - if deemed too high risk for surgery will need to consider palliative care   3. Atrial fibrillation: Paroxysmal.  In setting of severe mitral valve disease (stenosis/regurgitation).  Severe biatrial enlargement  on echo.  She is in NSR currently.  - Heparin gtt for now, was on warfarin at home.  - If planned for surgery, would start amiodarone pre-op given high risk for post-op atrial fibrillation.  4. RA: On Orencia/Plaquenil  5. Hypothyroidism - on Synthroid 175  6. Type 2DM:  Poorly controlled, Hgb A1c 9.0.  - Will stop metformin for now.  - SSI  - No SGLT2 inhibitor with UTI.  7. UTI: Urine culture has grown E coli.  She is allergic to PCNs, probably not cephalosporin candidate.  - Start Cipro 250 mg bid pending sensitivities.   Length of Stay: 3  Marca Ancona, MD  04/08/2023, 11:35 AM  Advanced Heart Failure Team Pager [phone] (M-F; 7a - 5p)  Please contact [facility] for night-coverage after hours (4p -7a ) and weekends on [email or web address]

## 2023-04-08 NOTE — Progress Notes (Signed)
PHARMACY - ANTICOAGULATION CONSULT NOTE  Pharmacy Consult for heparin Indication: atrial fibrillation  Allergies  Allergen Reactions   Fish Allergy Anaphylaxis and Hives   Amaryl [Glimepiride] Itching   Avalide [Irbesartan-Hydrochlorothiazide] Itching   Avandia [Rosiglitazone] Itching   Byetta 10 Mcg Pen [Exenatide] Itching   Codeine Itching    Other reaction(s): Unknown   Demerol [Meperidine Hcl] Itching   Glucovance [Glyburide-Metformin] Itching   Iodinated Contrast Media Itching    Other reaction(s):itching   Lisinopril Itching   Naprosyn [Naproxen] Hives   Penicillins Itching and Other (See Comments)    Has patient had a PCN reaction causing immediate rash, facial/tongue/throat swelling, SOB or lightheadedness with hypotension: no Has patient had a PCN reaction causing severe rash involving mucus membranes or skin necrosis: no Has patient had a PCN reaction that required hospitalization: yes Has patient had a PCN reaction occurring within the last 10 years: no If all of the above answers are "NO", then may proceed with Cephalosporin use.    Patient Measurements: Height: 5\' 3"  (160 cm) Weight: 56.6 kg (124 lb 12.5 oz) IBW/kg (Calculated) : 52.4 Heparin Dosing Weight: 57.1 kg  Vital Signs: Temp: 97.6 F (36.4 C) (12/07 0433) Temp Source: Oral (12/07 0433) BP: 138/75 (12/07 0814) Pulse Rate: 87 (12/07 0814)  Labs: Recent Labs    04/05/23 1703 04/05/23 1922 04/06/23 0425 04/06/23 1020 04/06/23 2103 04/07/23 0432 04/08/23 0207  HGB 9.9*  --  9.3*  --   --  9.3* 9.2*  HCT 35.2*  --  32.9*  --   --  32.9* 31.5*  PLT 330  --  263  --   --  254 296  LABPROT 16.1*  --   --   --   --   --   --   INR 1.3*  --   --   --   --   --   --   HEPARINUNFRC  --   --   --    < > 0.49 0.38 0.34  CREATININE 0.84  --  0.64  --   --  0.68 1.09*  TROPONINIHS 71* 67*  --   --   --   --   --    < > = values in this interval not displayed.    Estimated Creatinine Clearance: 38  mL/min (A) (by C-G formula based on SCr of 1.09 mg/dL (H)).   Medical History: Past Medical History:  Diagnosis Date   Asthma, mild    Intermittent   Atrial fibrillation [facility])    Feb 2023   Cancer [facility])    Chronic back pain    Echocardiogram abnormal 09/2008   With mild aortic valve and mitral valve regurgitation   Heart murmur    History of left breast cancer 1999   Hyperlipidemia    Hypertension    Hypothyroidism    IBS (irritable bowel syndrome)    Lactose intolerance    Osteoporosis    Peptic ulcer disease    rheumatoid Arthritis    Type II diabetes mellitus (HCC)    [facility] 08-2012 checks cbg bid   Ulcer, duodenal, acute, with obstruction 1999   Wears glasses     Medications: Warfarin PTA - currently on hold  Assessment: 72 year old female with history of atrial fibrillation on warfarin PTA (severe mitral stenosis). INR subtherapeutic on admission.  He is noted with HFpEF and plans for possible valvular procedures. Pharmacy consulted for heparin management.  -Heparin level 0.34 - therapeutic on  heparin @950  units/hr   Goal of Therapy:  Heparin level 0.3-0.7 units/ml Monitor platelets by anticoagulation protocol: Yes   Plan:  -Continue heparin infusion at 950 units/hr -Daily heparin level and CBC  Harland German, PharmD Clinical Pharmacist **Pharmacist phone directory can now be found on [email or web address] ([email or web address]).  Listed under [facility].

## 2023-04-09 DIAGNOSIS — I5033 Acute on chronic diastolic (congestive) heart failure: Secondary | ICD-10-CM | POA: Diagnosis not present

## 2023-04-09 LAB — BASIC METABOLIC PANEL
Anion gap: 13 (ref 5–15)
Anion gap: 19 — ABNORMAL HIGH (ref 5–15)
BUN: 41 mg/dL — ABNORMAL HIGH (ref 8–23)
BUN: 47 mg/dL — ABNORMAL HIGH (ref 8–23)
CO2: 28 mmol/L (ref 22–32)
CO2: 24 mmol/L (ref 22–32)
Calcium: 8.5 mg/dL — ABNORMAL LOW (ref 8.9–10.3)
Calcium: 8.5 mg/dL — ABNORMAL LOW (ref 8.9–10.3)
Chloride: 90 mmol/L — ABNORMAL LOW (ref 98–111)
Chloride: 90 mmol/L — ABNORMAL LOW (ref 98–111)
Creatinine, Ser: 1.09 mg/dL — ABNORMAL HIGH (ref 0.44–1.00)
Creatinine, Ser: 2.03 mg/dL — ABNORMAL HIGH (ref 0.44–1.00)
GFR, Estimated: 54 mL/min — ABNORMAL LOW (ref >60)
GFR, Estimated: 25 mL/min — ABNORMAL LOW (ref >60)
Glucose, Bld: 303 mg/dL — ABNORMAL HIGH (ref 70–99)
Glucose, Bld: 204 mg/dL — ABNORMAL HIGH (ref 70–99)
Potassium: 3.5 mmol/L (ref 3.5–5.1)
Potassium: 4.1 mmol/L (ref 3.5–5.1)
Sodium: 131 mmol/L — ABNORMAL LOW (ref 135–145)
Sodium: 133 mmol/L — ABNORMAL LOW (ref 135–145)

## 2023-04-09 LAB — CBC
HCT: 32.7 % — ABNORMAL LOW (ref 36.0–46.0)
Hemoglobin: 9.5 g/dL — ABNORMAL LOW (ref 12.0–15.0)
MCH: 23.1 pg — ABNORMAL LOW (ref 26.0–34.0)
MCHC: 29.1 g/dL — ABNORMAL LOW (ref 30.0–36.0)
MCV: 79.4 fL — ABNORMAL LOW (ref 80.0–100.0)
Platelets: 272 10*3/uL (ref 150–400)
RBC: 4.12 MIL/uL (ref 3.87–5.11)
RDW: 17.5 % — ABNORMAL HIGH (ref 11.5–15.5)
WBC: 11.9 10*3/uL — ABNORMAL HIGH (ref 4.0–10.5)
nRBC: 0 % (ref 0.0–0.2)

## 2023-04-09 LAB — GLUCOSE, CAPILLARY
Glucose-Capillary: 176 mg/dL — ABNORMAL HIGH (ref 70–99)
Glucose-Capillary: 155 mg/dL — ABNORMAL HIGH (ref 70–99)
Glucose-Capillary: 191 mg/dL — ABNORMAL HIGH (ref 70–99)
Glucose-Capillary: 159 mg/dL — ABNORMAL HIGH (ref 70–99)

## 2023-04-09 LAB — HEPARIN LEVEL (UNFRACTIONATED): Heparin Unfractionated: 0.61 [IU]/mL (ref 0.30–0.70)

## 2023-04-09 LAB — MAGNESIUM: Magnesium: 1.3 mg/dL — ABNORMAL LOW (ref 1.7–2.4)

## 2023-04-09 MED ORDER — METOPROLOL SUCCINATE ER 25 MG PO TB24
25.0000 mg | ORAL_TABLET | Freq: Two times a day (BID) | ORAL | Status: DC
  Administered 2023-04-09 – 2023-04-11 (×4): 25 mg via ORAL
  Filled 2023-04-09 (×5): qty 1

## 2023-04-09 MED ORDER — METOLAZONE 2.5 MG PO TABS
2.5000 mg | ORAL_TABLET | Freq: Once | ORAL | Status: AC
  Administered 2023-04-09: 2.5 mg via ORAL
  Filled 2023-04-09: qty 1

## 2023-04-09 MED ORDER — SODIUM CHLORIDE 0.9% FLUSH
10.0000 mL | Freq: Two times a day (BID) | INTRAVENOUS | Status: DC
  Administered 2023-04-09: 10 mL via INTRAVENOUS

## 2023-04-09 MED ORDER — POTASSIUM CHLORIDE CRYS ER 20 MEQ PO TBCR
40.0000 meq | EXTENDED_RELEASE_TABLET | Freq: Once | ORAL | Status: AC
  Administered 2023-04-09: 40 meq via ORAL
  Filled 2023-04-09: qty 2

## 2023-04-09 NOTE — Plan of Care (Signed)
  Problem: Education: Goal: Knowledge of General Education information will improve Description: Including pain rating scale, medication(s)/side effects and non-pharmacologic comfort measures Outcome: Progressing   Problem: Health Behavior/Discharge Planning: Goal: Ability to manage health-related needs will improve Outcome: Progressing   Problem: Clinical Measurements: Goal: Ability to maintain clinical measurements within normal limits will improve Outcome: Progressing Goal: Will remain free from infection Outcome: Progressing Goal: Diagnostic test results will improve Outcome: Progressing Goal: Respiratory complications will improve Outcome: Progressing Goal: Cardiovascular complication will be avoided Outcome: Progressing   Problem: Activity: Goal: Risk for activity intolerance will decrease Outcome: Progressing   Problem: Nutrition: Goal: Adequate nutrition will be maintained Outcome: Progressing   Problem: Coping: Goal: Level of anxiety will decrease Outcome: Progressing   Problem: Elimination: Goal: Will not experience complications related to bowel motility Outcome: Progressing Goal: Will not experience complications related to urinary retention Outcome: Progressing   Problem: Pain Management: Goal: General experience of comfort will improve Outcome: Progressing   Problem: Safety: Goal: Ability to remain free from injury will improve Outcome: Progressing   Problem: Skin Integrity: Goal: Risk for impaired skin integrity will decrease Outcome: Progressing   Problem: Education: Goal: Ability to describe self-care measures that may prevent or decrease complications (Diabetes Survival Skills Education) will improve Outcome: Progressing Goal: Individualized Educational Video(s) Outcome: Progressing   Problem: Coping: Goal: Ability to adjust to condition or change in health will improve Outcome: Progressing   Problem: Fluid Volume: Goal: Ability to  maintain a balanced intake and output will improve Outcome: Progressing   Problem: Health Behavior/Discharge Planning: Goal: Ability to identify and utilize available resources and services will improve Outcome: Progressing Goal: Ability to manage health-related needs will improve Outcome: Progressing   Problem: Metabolic: Goal: Ability to maintain appropriate glucose levels will improve Outcome: Progressing   Problem: Nutritional: Goal: Maintenance of adequate nutrition will improve Outcome: Progressing Goal: Progress toward achieving an optimal weight will improve Outcome: Progressing   Problem: Skin Integrity: Goal: Risk for impaired skin integrity will decrease Outcome: Progressing   Problem: Education: Goal: Ability to demonstrate management of disease process will improve Outcome: Progressing Goal: Ability to verbalize understanding of medication therapies will improve Outcome: Progressing Goal: Individualized Educational Video(s) Outcome: Progressing   Problem: Activity: Goal: Capacity to carry out activities will improve Outcome: Progressing   Problem: Cardiac: Goal: Ability to achieve and maintain adequate cardiopulmonary perfusion will improve Outcome: Progressing

## 2023-04-09 NOTE — Progress Notes (Signed)
Patient ID: [name], female   DOB: May 18, 1950, 72 y.o.   MRN: [id number]     Advanced Heart Failure Rounding Note  PCP-Cardiologist: Sunit Tolia, DO   Subjective:    Patient reports good diuresis with Lasix 80 mg IV dose last night, I/Os may not be complete.   No labs were done this morning.   She walked in hall yesterday.  She had a visual hallucination of a small white dog in her room last night.   On Keflex for pan-sensitive E coli UTI.    Objective:   Weight Range: 56 kg Body mass index is 21.87 kg/m.   Vital Signs:   Temp:  [97.4 F (36.3 C)-97.9 F (36.6 C)] 97.4 F (36.3 C) (12/08 0700) Pulse Rate:  [63-92] 79 (12/08 0700) Resp:  [11-20] 20 (12/08 0337) BP: (107-142)/(57-83) 125/78 (12/08 0700) SpO2:  [93 %-95 %] 93 % (12/08 0700) Weight:  [56 kg] 56 kg (12/08 0337) Last BM Date : 04/08/23  Weight change: Filed Weights   04/07/23 0500 04/08/23 0433 04/09/23 0337  Weight: 53.3 kg 56.6 kg 56 kg    Intake/Output:   Intake/Output Summary (Last 24 hours) at 04/09/2023 0834 Last data filed at 04/09/2023 0744 Gross per 24 hour  Intake 880 ml  Output 1200 ml  Net -320 ml      Physical Exam    General:  Well appearing. No resp difficulty HEENT: Normal Neck: Supple. JVP 12-14. Carotids 2+ bilat; no bruits. No lymphadenopathy or thyromegaly appreciated. Cor: PMI nondisplaced. Regular rate & rhythm. 2/6 SEM RUSB. Lungs: Clear Abdomen: Soft, nontender, nondistended. No hepatosplenomegaly. No bruits or masses. Good bowel sounds. Extremities: No cyanosis, clubbing, rash. 2+ edema to knees.  Neuro: Alert & orientedx3, cranial nerves grossly intact. moves all 4 extremities w/o difficulty. Affect pleasant   Telemetry   NSR 70s (personally reviewed)  Labs    CBC Recent Labs    04/07/23 0432 04/08/23 0207  WBC 8.5 11.8*  HGB 9.3* 9.2*  HCT 32.9* 31.5*  MCV 83.1 80.2  PLT 254 296   Basic Metabolic Panel Recent Labs    40/98/11 0432  04/08/23 0207 04/08/23 0805  NA 136 133*  --   K 3.6 4.1  --   CL 95* 90*  --   CO2 25 23  --   GLUCOSE 159* 151*  --   BUN 24* 33*  --   CREATININE 0.68 1.09*  --   CALCIUM 8.5* 8.8*  --   MG  --  1.1* 1.9   Liver Function Tests No results for input(s): "AST", "ALT", "ALKPHOS", "BILITOT", "PROT", "ALBUMIN" in the last 72 hours. No results for input(s): "LIPASE", "AMYLASE" in the last 72 hours. Cardiac Enzymes No results for input(s): "CKTOTAL", "CKMB", "CKMBINDEX", "TROPONINI" in the last 72 hours.  BNP: BNP (last 3 results) Recent Labs    04/05/23 1703  BNP 1,498.3*    ProBNP (last 3 results) No results for input(s): "PROBNP" in the last 8760 hours.   D-Dimer No results for input(s): "DDIMER" in the last 72 hours. Hemoglobin A1C No results for input(s): "HGBA1C" in the last 72 hours. Fasting Lipid Panel No results for input(s): "CHOL", "HDL", "LDLCALC", "TRIG", "CHOLHDL", "LDLDIRECT" in the last 72 hours. Thyroid Function Tests No results for input(s): "TSH", "T4TOTAL", "T3FREE", "THYROIDAB" in the last 72 hours.  Invalid input(s): "FREET3"  Other results:   Imaging    No results found.   Medications:     Scheduled Medications:  buPROPion  150 mg Oral QPM   cephALEXin  500 mg Oral Q8H   diltiazem  300 mg Oral Daily   escitalopram  10 mg Oral QPM   folic acid  2 mg Oral QPM   furosemide  80 mg Intravenous BID   hydroxychloroquine  200 mg Oral QPM   insulin aspart  0-9 Units Subcutaneous TID WC   levothyroxine  175 mcg Oral QPM   metolazone  2.5 mg Oral Once   metoprolol succinate  25 mg Oral BID   morphine  30 mg Oral Q12H   pravastatin  40 mg Oral QPM   sodium chloride flush  3 mL Intravenous Q12H   spironolactone  12.5 mg Oral Daily    Infusions:  heparin 950 Units/hr (04/08/23 2254)    PRN Medications: acetaminophen, hydrocortisone, metoprolol tartrate, ondansetron (ZOFRAN) IV, oxyCODONE-acetaminophen, polyvinyl alcohol, sodium chloride  flush    Assessment/Plan   1. Acute HFpEF w/prominent RV dysfunction and valvular heart disease: I reviewed this admission's echo, on my read it showed EF vigorous 65-70% with underfilled LV, LV intracavitary gradient peak 21 mmHg, moderate LVH, grade III DD, interventricular septum flattened in systole and diastole c/w RV pressure and volume overlad, RV mildly reduced systolic function and mildly dilated, RVSP 78 mmHg, severe BAE, moderate MR, severe MS with mean gradient of 21 mmHg, moderate-severe paradoxical low flow low gradient AS with mean gradient of 19 mmHg and AVA 0.78 cm2, mild-moderate AI, moderate TR.  Echo from 4/23 also showed valvular disease.  She is volume overloaded on exam with NYHA class IIIb symptoms at home. Needs full diuresis followed by RHC/LHC. Some diuresis yesterday, I/Os likely incomplete. No labs yet.  - BMET stat.  - Continue Lasix 80 mg IV bid and will give metolazone 2.5 x 1.   - Continue spironolactone 12.5 daily.  - No SGLT2 inhibitor with UTI.  3. Valvular heart disease: She has severe mitral stenosis with moderate MR and a heavily calcified MV.  There is moderate-severe paradoxical low flow/low gradient AS with mean gradient 19 mmHg, AVA 0.79 cm^2.  She also has moderate TR.  Etiology is possibly rheumatic versus due to prior left-sided radiation for breast cancer in 1990s versus a combination. Her valvular disease is quite severe.  - She will need TEE to more closely assess the valves, will plan for tomorrow.  Discussed risks/benefits with patient and she agrees to procedure.  - She will need left/right heart catheterization after volume optimization.  - She will need TCTS consult after the above.  Ideally would have MV and aortic valve replacements, tricuspid valve repair, and Maze with LA appendage clipping.  - If she has cardiac surgery, I can foresee potential problems with her hyperdynamic LV leading to underfilling and hypotension post-operatively.  Will  continue to use diltiazem CD and will increase Toprol XL to 25 mg bid to try to decrease contractility.  - if deemed too high risk for surgery will need to consider palliative care   3. Atrial fibrillation: Paroxysmal.  In setting of severe mitral valve disease (stenosis/regurgitation).  Severe biatrial enlargement on echo.  She is in NSR currently.  - Heparin gtt for now, was on warfarin at home.  - If planned for surgery, would start amiodarone pre-op given high risk for post-op atrial fibrillation.  4. RA: On Orencia/Plaquenil  5. Hypothyroidism - on Synthroid 175  6. Type 2DM:  Poorly controlled, Hgb A1c 9.0.  - Will stop metformin for now.  - SSI  -  No SGLT2 inhibitor with UTI.  7. UTI: Urine culture has grown E coli.   - Cephalexin.  8. Visual hallucinations: Saw small white dog in her room last night. Very clear this morning.  Suspect mild delirium overnight. Follow closely.   Length of Stay: 4  Marca Ancona, MD  04/09/2023, 8:34 AM  Advanced Heart Failure Team Pager [phone] (M-F; 7a - 5p)  Please contact [facility] for night-coverage after hours (5p -7a ) and weekends on [email or web address]

## 2023-04-09 NOTE — Progress Notes (Signed)
PROGRESS NOTE  [name]  VHQ:[id number] DOB: [birth year] DOA: 04/05/2023 PCP: Noberto Retort, MD   Brief Narrative: Patient is a 72 year old female with history of rheumatoid arthritis, breast cancer status postlumpectomy and chemoradiation with subsequent left mastectomy, paroxysmal A-fib on Coumadin, moderate MR/severe MS, diabetes type 2, hypertension, hyperlipidemia, hypothyroidism who presented with 1 to 2 weeks history of progressive exertional dyspnea, dizziness.  On presentation, lab work showed elevated BUN, mild leukocytosis, hypomagnesemia.  Chest x-ray showed diffuse cardiac enlargement.  Echo showed severe mitral stenosis.  Cardiology consulted.  Currently being managed for acute on chronic diastolic CHF.  On IV Lasix.  12/8: Vital stable, UOP recorded is 800, weight with small decrease to 123.46.  Lasix dose was increased by cardiology yesterday. Likely will get cardiac cath tomorrow.  Assessment & Plan:  Principal Problem:   Acute on chronic diastolic (congestive) heart failure (HCC) Active Problems:   Rheumatoid arthritis (HCC)   Atrial fibrillation (HCC)   Chronic pain syndrome   DM2 (diabetes mellitus, type 2) (HCC)   HTN (hypertension)   Chronic narcotic use   Acute heart failure with preserved ejection fraction (HFpEF) (HCC)   Mitral valve disease   Mitral valve stenosis and regurgitation   Moderate to severe aortic stenosis   Non-insulin dependent type 2 diabetes mellitus (HCC)   Long term (current) use of anticoagulants  Severe valvular disease: Patient with severe MS/MR and moderately severe AS. Presented with exertional dyspnea.  Echo shows severe mitral stenosis.  History of mitral regurgitation as well.  Cardiology following.  Current plan is to optimize her volume status then right/left heart cath and eventually CT surgery consultation for mitral and aortic valve replacement, likely will get cardiac cath tomorrow Echo also showed severe pulmonary  hypertension  Acute diastolic CHF: Elevated BNP on presentation.  Appeared volume overloaded.  Advanced heart failure team following. Lasix dose was increased to 80 mg twice daily Spironolactone at 12.5 mg added.  Monitor input/output, daily weight.  Still has significant leg edema.  Has compression stockings.  Paroxysmal A-fib: History of DCCV in April 2023.  Currently in normal sinus rhythm.  On IV heparin for anticoagulation.  Takes Coumadin at home.  Currently also on diltiazem 300 mg daily.  Type 2 diabetes: Recent A1c of 9%.  Continue current insulin regimen.  Monitor blood sugars  Severe hypomagnesemia: Continue to monitor and supplement.  Hypothyroidism: Continue Synthyroid.  Diabetes type 2: On Orencia/Plaquenil.  Plaquenil has been continued here.  Follows with rheumatology  Depression: On Wellbutrin/Lexapro  UTI/leukocytosis: Urine culture showed significant E. coli.  Started on Keflex.  Has mild leukocytosis.  Denies any dysuria today  Normocytic anemia: Current hemoglobin stable in the range of 9.  Chronic pain syndrome: On MS Contin, oxycodone.     Pressure Injury 04/05/23 Buttocks Right;Left Stage 2 -  Partial thickness loss of dermis presenting as a shallow open injury with a red, pink wound bed without slough. (Active)  04/05/23 2200  Location: Buttocks  Location Orientation: Right;Left  Staging: Stage 2 -  Partial thickness loss of dermis presenting as a shallow open injury with a red, pink wound bed without slough.  Wound Description (Comments):   Present on Admission: Yes  Dressing Type Foam - Lift dressing to assess site every shift 04/08/23 0900    DVT prophylaxis:iv heparin     Code Status: Limited: Do not attempt resuscitation (DNR) -DNR-LIMITED -Do Not Intubate/DNI   Family Communication:   Patient status:Inpatient  Patient is from :home  Anticipated discharge ZO:XWRU  Estimated DC date: After completion of planned workup, cardiology  clearance   Consultants: Cardiology  Procedures: None yet  Antimicrobials:  Anti-infectives (From admission, onward)    Start     Dose/Rate Route Frequency Ordered Stop   04/08/23 1400  cephALEXin (KEFLEX) capsule 500 mg        500 mg Oral Every 8 hours 04/08/23 1140 04/13/23 1359   04/08/23 1145  ciprofloxacin (CIPRO) tablet 250 mg  Status:  Discontinued        250 mg Oral 2 times daily 04/08/23 1058 04/08/23 1140   04/06/23 1800  hydroxychloroquine (PLAQUENIL) tablet 200 mg        200 mg Oral Every evening 04/06/23 0715         Subjective: Patient was seen and examined today.  She was have more generalized complaints stating that this hospital does not have depend and they are not giving her enough food as she was staying hungry.   Objective: Vitals:   04/08/23 2010 04/08/23 2227 04/09/23 0337 04/09/23 0700  BP: (!) 107/57 113/62 128/61 125/78  Pulse: 63 64 76 79  Resp: 20 13 20    Temp: 97.9 F (36.6 C) 97.6 F (36.4 C) (!) 97.5 F (36.4 C)   TempSrc: Oral Oral Oral Oral  SpO2: 95% 94% 95% 93%  Weight:   56 kg   Height:        Intake/Output Summary (Last 24 hours) at 04/09/2023 0755 Last data filed at 04/09/2023 0744 Gross per 24 hour  Intake 880 ml  Output 1200 ml  Net -320 ml   Filed Weights   04/07/23 0500 04/08/23 0433 04/09/23 0337  Weight: 53.3 kg 56.6 kg 56 kg    Examination: General.  Frail elderly lady, in no acute distress. Pulmonary.  Lungs clear bilaterally, normal respiratory effort. CV.  Regular rate and rhythm, positive murmur Abdomen.  Soft, nontender, nondistended, BS positive. CNS.  Alert and oriented .  No focal neurologic deficit. Extremities.  2+ LE edema, no cyanosis, pulses intact and symmetrical.  Data Reviewed: I have personally reviewed following labs and imaging studies  CBC: Recent Labs  Lab 04/05/23 1703 04/06/23 0425 04/07/23 0432 04/08/23 0207  WBC 12.5* 9.9 8.5 11.8*  NEUTROABS 10.9*  --   --   --   HGB 9.9* 9.3*  9.3* 9.2*  HCT 35.2* 32.9* 32.9* 31.5*  MCV 83.8 83.3 83.1 80.2  PLT 330 263 254 296   Basic Metabolic Panel: Recent Labs  Lab 04/05/23 1703 04/05/23 1922 04/06/23 0425 04/07/23 0432 04/08/23 0207 04/08/23 0805  NA 139  --  137 136 133*  --   K 3.0*  --  3.5 3.6 4.1  --   CL 99  --  98 95* 90*  --   CO2 27  --  27 25 23   --   GLUCOSE 207*  --  155* 159* 151*  --   BUN 20  --  16 24* 33*  --   CREATININE 0.84  --  0.64 0.68 1.09*  --   CALCIUM 8.6*  --  8.6* 8.5* 8.8*  --   MG  --  1.2* 1.9  --  1.1* 1.9  PHOS  --   --  4.6  --   --   --      Recent Results (from the past 240 hour(s))  Urine Culture     Status: Abnormal   Collection Time: 04/05/23  5:57 PM   Specimen:  Urine, Random  Result Value Ref Range Status   Specimen Description   Final    URINE, RANDOM Performed at [facility], [address], [place], [place] [address]    Special Requests   Final    NONE Reflexed from [id number] Performed at [facility], [address] [place], [place] [address]    Culture >=100,000 COLONIES/mL ESCHERICHIA COLI (A)  Final   Report Status 04/07/2023 FINAL  Final   Organism ID, Bacteria ESCHERICHIA COLI (A)  Final      Susceptibility   Escherichia coli - MIC*    AMPICILLIN <=2 SENSITIVE Sensitive     CEFAZOLIN <=4 SENSITIVE Sensitive     CEFEPIME <=0.12 SENSITIVE Sensitive     CEFTRIAXONE <=0.25 SENSITIVE Sensitive     CIPROFLOXACIN <=0.25 SENSITIVE Sensitive     GENTAMICIN <=1 SENSITIVE Sensitive     IMIPENEM <=0.25 SENSITIVE Sensitive     NITROFURANTOIN <=16 SENSITIVE Sensitive     TRIMETH/SULFA <=20 SENSITIVE Sensitive     AMPICILLIN/SULBACTAM <=2 SENSITIVE Sensitive     PIP/TAZO <=4 SENSITIVE Sensitive ug/mL    * >=100,000 COLONIES/mL ESCHERICHIA COLI     Radiology Studies: No results found.  Scheduled Meds:  buPROPion  150 mg Oral QPM   cephALEXin  500 mg Oral Q8H   diltiazem  300 mg Oral Daily   escitalopram  10 mg Oral  QPM   folic acid  2 mg Oral QPM   furosemide  80 mg Intravenous BID   hydroxychloroquine  200 mg Oral QPM   insulin aspart  0-9 Units Subcutaneous TID WC   levothyroxine  175 mcg Oral QPM   metoprolol succinate  25 mg Oral Daily   morphine  30 mg Oral Q12H   pravastatin  40 mg Oral QPM   sodium chloride flush  3 mL Intravenous Q12H   spironolactone  12.5 mg Oral Daily   Continuous Infusions:  heparin 950 Units/hr (04/08/23 2254)     LOS: 4 days   Arnetha Courser, MD [facility] P12/12/2022, 7:55 AM

## 2023-04-09 NOTE — Progress Notes (Signed)
Consent for TEE not signed.  Patient disoriented to situation and time.  Per nursing report she does this in the evenings. Will attempt consent in am when patient more oriented.

## 2023-04-09 NOTE — Progress Notes (Signed)
Mobility Specialist Progress Note    04/09/23 1333  Mobility  Activity Ambulated with assistance in hallway  Level of Assistance Contact guard assist, steadying assist  Assistive Device Front wheel walker  Distance Ambulated (ft) 100 ft  Activity Response Tolerated fair  Mobility Referral Yes  Mobility visit 1 Mobility  Mobility Specialist Start Time (ACUTE ONLY) 1321  Mobility Specialist Stop Time (ACUTE ONLY) 1332  Mobility Specialist Time Calculation (min) (ACUTE ONLY) 11 min   Pre-Mobility: 60 HR Post-Mobility: 67 HR  Pt received sitting EOB and agreeable. C/o fatigue and the medicine making her feel off. Took x2 standing rest breaks leaning onto the wall. Returned to sitting EOB with call bell in reach.   [place] Mobility Specialist  Please Neurosurgeon or Rehab Office at [phone]

## 2023-04-09 NOTE — Progress Notes (Signed)
PHARMACY - ANTICOAGULATION CONSULT NOTE  Pharmacy Consult for heparin Indication: atrial fibrillation  Allergies  Allergen Reactions   Fish Allergy Anaphylaxis and Hives   Amaryl [Glimepiride] Itching   Avalide [Irbesartan-Hydrochlorothiazide] Itching   Avandia [Rosiglitazone] Itching   Byetta 10 Mcg Pen [Exenatide] Itching   Codeine Itching    Other reaction(s): Unknown   Demerol [Meperidine Hcl] Itching   Glucovance [Glyburide-Metformin] Itching   Iodinated Contrast Media Itching    Other reaction(s):itching   Lisinopril Itching   Naprosyn [Naproxen] Hives   Penicillins Itching and Other (See Comments)    Has patient had a PCN reaction causing immediate rash, facial/tongue/throat swelling, SOB or lightheadedness with hypotension: no Has patient had a PCN reaction causing severe rash involving mucus membranes or skin necrosis: no Has patient had a PCN reaction that required hospitalization: yes Has patient had a PCN reaction occurring within the last 10 years: no If all of the above answers are "NO", then may proceed with Cephalosporin use.    Patient Measurements: Height: 5\' 3"  (160 cm) Weight: 56 kg (123 lb 7.3 oz) IBW/kg (Calculated) : 52.4 Heparin Dosing Weight: 57.1 kg  Vital Signs: Temp: 97.4 F (36.3 C) (12/08 0700) Temp Source: Oral (12/08 0700) BP: 125/78 (12/08 0700) Pulse Rate: 79 (12/08 0700)  Labs: Recent Labs    04/07/23 0432 04/08/23 0207 04/09/23 0842  HGB 9.3* 9.2* 9.5*  HCT 32.9* 31.5* 32.7*  PLT 254 296 272  HEPARINUNFRC 0.38 0.34 0.61  CREATININE 0.68 1.09* 1.09*    Estimated Creatinine Clearance: 38 mL/min (A) (by C-G formula based on SCr of 1.09 mg/dL (H)).   Medical History: Past Medical History:  Diagnosis Date   Asthma, mild    Intermittent   Atrial fibrillation [facility])    Feb 2023   Cancer [facility])    Chronic back pain    Echocardiogram abnormal 09/2008   With mild aortic valve and mitral valve regurgitation   Heart murmur     History of left breast cancer 1999   Hyperlipidemia    Hypertension    Hypothyroidism    IBS (irritable bowel syndrome)    Lactose intolerance    Osteoporosis    Peptic ulcer disease    rheumatoid Arthritis    Type II diabetes mellitus (HCC)    [facility] 08-2012 checks cbg bid   Ulcer, duodenal, acute, with obstruction 1999   Wears glasses     Medications: Warfarin PTA - currently on hold  Assessment: 72 year old female with history of atrial fibrillation on warfarin PTA (severe mitral stenosis). INR subtherapeutic on admission.  He is noted with HFpEF and plans for possible valvular procedures. Pharmacy consulted for heparin management.  -Heparin level 0.61 - therapeutic on heparin @950  units/hr, CBC stable   Goal of Therapy:  Heparin level 0.3-0.7 units/ml Monitor platelets by anticoagulation protocol: Yes   Plan:  -Continue heparin infusion at 950 units/hr -Daily heparin level and CBC  Harland German, PharmD Clinical Pharmacist **Pharmacist phone directory can now be found on [email or web address] ([email or web address]).  Listed under [facility].

## 2023-04-09 NOTE — Anesthesia Preprocedure Evaluation (Signed)
Anesthesia Evaluation  Patient identified by MRN, date of birth, ID band Patient awake    Reviewed: Allergy & Precautions, NPO status , Patient's Chart, lab work & pertinent test results  Airway Mallampati: II  TM Distance: >3 FB Neck ROM: Full    Dental no notable dental hx. (+) Poor Dentition, Missing, Dental Advisory Given   Pulmonary asthma , former smoker   Pulmonary exam normal breath sounds clear to auscultation       Cardiovascular hypertension, Pt. on medications pulmonary hypertension+CHF  Normal cardiovascular exam+ dysrhythmias (eliquis) Atrial Fibrillation + Valvular Problems/Murmurs (MS)  Rhythm:Irregular Rate:Normal  04/06/2023 Echo Left Ventricle: LV intracavitary gradient of . Left ventricular  ejection fraction, by estimation, is 55 to 60%. The left ventricle has  normal function. The left ventricle has no regional wall motion  abnormalities. The average left ventricular  global longitudinal strain is -5.3 %. The global longitudinal strain is  abnormal. The left ventricular internal cavity size was small. There is  severe left ventricular hypertrophy. The interventricular septum is  flattened in systole and diastole,  consistent with right ventricular pressure and volume overload. Left  ventricular diastolic parameters are consistent with Grade III diastolic  dysfunction (restrictive).   Right Ventricle: RVSP . The right ventricular size is moderately  enlarged. Mildly increased right ventricular wall thickness. Right  ventricular systolic function is mildly reduced. There is severely  elevated pulmonary artery systolic pressure.      Neuro/Psych negative neurological ROS  negative psych ROS   GI/Hepatic Neg liver ROS, PUD,,,  Endo/Other  diabetes, Well Controlled, Type 2, Oral Hypoglycemic AgentsHypothyroidism    Renal/GU negative Renal ROS  negative genitourinary    Musculoskeletal  (+) Arthritis , Rheumatoid disorders,    Abdominal   Peds negative pediatric ROS (+)  Hematology negative hematology ROS (+)   Anesthesia Other Findings   Reproductive/Obstetrics negative OB ROS                             Anesthesia Physical Anesthesia Plan  ASA: 3  Anesthesia Plan: MAC   Post-op Pain Management:    Induction: Intravenous  PONV Risk Score and Plan: TIVA and Treatment may vary due to age or medical condition  Airway Management Planned: Natural Airway and Mask  Additional Equipment: None  Intra-op Plan:   Post-operative Plan:   Informed Consent: I have reviewed the patients History and Physical, chart, labs and discussed the procedure including the risks, benefits and alternatives for the proposed anesthesia with the patient or authorized representative who has indicated his/her understanding and acceptance.     Dental advisory given  Plan Discussed with: CRNA  Anesthesia Plan Comments:         Anesthesia Quick Evaluation

## 2023-04-10 ENCOUNTER — Inpatient Hospital Stay (HOSPITAL_COMMUNITY): Payer: Medicare Other

## 2023-04-10 ENCOUNTER — Encounter (HOSPITAL_COMMUNITY)
Admission: EM | Disposition: A | Discharge status: Hospice | Payer: Self-pay | Source: Home / Self Care | Attending: Internal Medicine

## 2023-04-10 ENCOUNTER — Inpatient Hospital Stay (HOSPITAL_COMMUNITY): Payer: Self-pay | Admitting: Anesthesiology

## 2023-04-10 ENCOUNTER — Encounter (HOSPITAL_COMMUNITY): Payer: Self-pay | Admitting: Internal Medicine

## 2023-04-10 DIAGNOSIS — I340 Nonrheumatic mitral (valve) insufficiency: Secondary | ICD-10-CM

## 2023-04-10 DIAGNOSIS — I342 Nonrheumatic mitral (valve) stenosis: Secondary | ICD-10-CM | POA: Diagnosis not present

## 2023-04-10 DIAGNOSIS — I350 Nonrheumatic aortic (valve) stenosis: Secondary | ICD-10-CM | POA: Diagnosis not present

## 2023-04-10 DIAGNOSIS — I5033 Acute on chronic diastolic (congestive) heart failure: Secondary | ICD-10-CM | POA: Diagnosis not present

## 2023-04-10 HISTORY — PX: TRANSESOPHAGEAL ECHOCARDIOGRAM (CATH LAB): EP1270

## 2023-04-10 LAB — BASIC METABOLIC PANEL WITH GFR
Anion gap: 21 — ABNORMAL HIGH (ref 5–15)
BUN: 51 mg/dL — ABNORMAL HIGH (ref 8–23)
CO2: 21 mmol/L — ABNORMAL LOW (ref 22–32)
Calcium: 8.5 mg/dL — ABNORMAL LOW (ref 8.9–10.3)
Chloride: 91 mmol/L — ABNORMAL LOW (ref 98–111)
Creatinine, Ser: 1.73 mg/dL — ABNORMAL HIGH (ref 0.44–1.00)
GFR, Estimated: 31 mL/min — ABNORMAL LOW
Glucose, Bld: 194 mg/dL — ABNORMAL HIGH (ref 70–99)
Potassium: 4.2 mmol/L (ref 3.5–5.1)
Sodium: 133 mmol/L — ABNORMAL LOW (ref 135–145)

## 2023-04-10 LAB — GLUCOSE, CAPILLARY
Glucose-Capillary: 146 mg/dL — ABNORMAL HIGH (ref 70–99)
Glucose-Capillary: 179 mg/dL — ABNORMAL HIGH (ref 70–99)
Glucose-Capillary: 217 mg/dL — ABNORMAL HIGH (ref 70–99)
Glucose-Capillary: 251 mg/dL — ABNORMAL HIGH (ref 70–99)

## 2023-04-10 LAB — MAGNESIUM: Magnesium: 1.4 mg/dL — ABNORMAL LOW (ref 1.7–2.4)

## 2023-04-10 LAB — ECHO TEE
AR max vel: 1.14 cm2
AV Area VTI: 1.17 cm2
AV Area mean vel: 1.12 cm2
AV Mean grad: 21.7 mm[Hg]
AV Peak grad: 33.2 mm[Hg]
Ao pk vel: 2.88 m/s
MV M vel: 6.08 m/s
MV Peak grad: 147.6 mm[Hg]
MV VTI: 0.9 cm2
Radius: 0.6 cm

## 2023-04-10 LAB — HEPARIN LEVEL (UNFRACTIONATED): Heparin Unfractionated: 0.26 [IU]/mL — ABNORMAL LOW (ref 0.30–0.70)

## 2023-04-10 SURGERY — TRANSESOPHAGEAL ECHOCARDIOGRAM (TEE) (CATHLAB)
Anesthesia: Monitor Anesthesia Care

## 2023-04-10 MED ORDER — GLYCOPYRROLATE 0.2 MG/ML IJ SOLN
INTRAMUSCULAR | Status: DC | PRN
  Administered 2023-04-10: .1 mg via INTRAVENOUS

## 2023-04-10 MED ORDER — MAGNESIUM SULFATE 4 GM/100ML IV SOLN
4.0000 g | Freq: Once | INTRAVENOUS | Status: AC
  Administered 2023-04-10: 4 g via INTRAVENOUS
  Filled 2023-04-10: qty 100

## 2023-04-10 MED ORDER — LIDOCAINE 2% (20 MG/ML) 5 ML SYRINGE
INTRAMUSCULAR | Status: DC | PRN
  Administered 2023-04-10: 10 mg via INTRAVENOUS

## 2023-04-10 MED ORDER — LIDOCAINE HCL 4 % EX SOLN
CUTANEOUS | Status: DC | PRN
  Administered 2023-04-10: 2 mL via TOPICAL

## 2023-04-10 MED ORDER — PROPOFOL 500 MG/50ML IV EMUL
INTRAVENOUS | Status: DC | PRN
  Administered 2023-04-10: 80 ug/kg/min via INTRAVENOUS

## 2023-04-10 MED ORDER — CEPHALEXIN 500 MG PO CAPS
500.0000 mg | ORAL_CAPSULE | Freq: Two times a day (BID) | ORAL | Status: DC
  Administered 2023-04-10 – 2023-04-12 (×5): 500 mg via ORAL
  Filled 2023-04-10 (×6): qty 1

## 2023-04-10 MED ORDER — PROPOFOL 10 MG/ML IV BOLUS
INTRAVENOUS | Status: DC | PRN
  Administered 2023-04-10: 50 mg via INTRAVENOUS
  Administered 2023-04-10: 20 mg via INTRAVENOUS

## 2023-04-10 NOTE — Progress Notes (Signed)
PHARMACY - ANTICOAGULATION CONSULT NOTE  Pharmacy Consult for heparin Indication: atrial fibrillation  Labs: Recent Labs    04/07/23 0432 04/08/23 0207 04/09/23 0842 04/09/23 1903 04/10/23 0222  HGB 9.3* 9.2* 9.5*  --   --   HCT 32.9* 31.5* 32.7*  --   --   PLT 254 296 272  --   --   HEPARINUNFRC 0.38 0.34 0.61  --  0.26*  CREATININE 0.68 1.09* 1.09* 2.03* 1.73*   Assessment: 72yo fmale subtherapeutic on heparin after a few levels at goal; no infusion issues or signs of bleeding per RN.  Goal of Therapy:  Heparin level 0.3-0.7 units/ml   Plan:  Increase heparin infusion slightly to 1000 units/hr. Check level in 8 hours.   Vernard Gambles, PharmD, BCPS 04/10/2023 3:43 AM

## 2023-04-10 NOTE — Transfer of Care (Signed)
Immediate Anesthesia Transfer of Care Note  Patient: [name]  Procedure(s) Performed: TRANSESOPHAGEAL ECHOCARDIOGRAM  Patient Location: PACU  Anesthesia Type:MAC  Level of Consciousness: drowsy  Airway & Oxygen Therapy: Patient Spontanous Breathing and Patient connected to nasal cannula oxygen  Post-op Assessment: Report given to RN and Post -op Vital signs reviewed and stable  Post vital signs: Reviewed and stable  Last Vitals:  Vitals Value Taken Time  BP    Temp    Pulse    Resp    SpO2      Last Pain:  Vitals:   04/10/23 0656  TempSrc: Temporal  PainSc: 0-No pain      Patients Stated Pain Goal: 0 (04/09/23 2230)  Complications: No notable events documented.

## 2023-04-10 NOTE — Progress Notes (Signed)
Mobility Specialist Progress Note:    04/10/23 1427  Mobility  Activity Transferred to/from [facility]  Level of Assistance Contact guard assist, steadying assist  Assistive Device Other (Comment) (HHA)  Distance Ambulated (ft) 2 ft  Activity Response Tolerated well  Mobility Referral Yes  Mobility visit 1 Mobility  Mobility Specialist Start Time (ACUTE ONLY) 1400  Mobility Specialist Stop Time (ACUTE ONLY) 1411  Mobility Specialist Time Calculation (min) (ACUTE ONLY) 11 min   Pt received on BSC, needing assistance back to bed. Able to stand and pivot back to bed w/ HHA. Ambulation deferred d/t IV causing pt arm to swell. Pt left in bed with call bell and all needs met.  D'Vante Earlene Plater Mobility Specialist Please contact via Special educational needs teacher or Rehab office at [phone]

## 2023-04-10 NOTE — Progress Notes (Signed)
  Echocardiogram Echocardiogram Transesophageal has been performed.  [name] 04/10/2023, 9:01 AM

## 2023-04-10 NOTE — TOC Initial Note (Signed)
Transition of Care [facility]) - Initial/Assessment Note    Patient Details  Name: [name] MRN: [id number] Date of Birth: January 03, 1951  Transition of Care [facility]) CM/SW Contact:    Nicanor Bake Phone Number: [phone] 04/10/2023, 11:30 AM  Clinical Narrative: HF CSW met with pt at bedside. Pt stated that she lives alone. Pt stated that she has no history of HH services. Pt stated that she does not use any equipment at home. Pt stated that she has a scale at home. Pt stated that she has family support that lives close by such as her niece and sister in law. CSW stated that a hospital follow up appointment with PCP will be scheduled closer to dc. Pt agrees.    TOC will continue following.                  Expected Discharge Plan: Home/Self Care Barriers to Discharge: Continued Medical Work up   Patient Goals and CMS Choice            Expected Discharge Plan and Services       Living arrangements for the past 2 months: Mobile Home                                      Prior Living Arrangements/Services Living arrangements for the past 2 months: Mobile Home Lives with:: Self, Pets Patient language and need for interpreter reviewed:: Yes Do you feel safe going back to the place where you live?: Yes      Need for Family Participation in Patient Care: No (Comment) Care giver support system in place?: No (comment)   Criminal Activity/Legal Involvement Pertinent to Current Situation/Hospitalization: No - Comment as needed  Activities of Daily Living   ADL Screening (condition at time of admission) Independently performs ADLs?: Yes (appropriate for developmental age) Is the patient deaf or have difficulty hearing?: No Does the patient have difficulty seeing, even when wearing glasses/contacts?: No Does the patient have difficulty concentrating, remembering, or making decisions?: No  Permission Sought/Granted                  Emotional  Assessment Appearance:: Appears stated age Attitude/Demeanor/Rapport: Engaged Affect (typically observed): Appropriate Orientation: : Oriented to Self, Oriented to Place, Oriented to  Time, Oriented to Situation Alcohol / Substance Use: Not Applicable Psych Involvement: No (comment)  Admission diagnosis:  Hypokalemia [E87.6] Acute on chronic diastolic (congestive) heart failure (HCC) [I50.33] Acute congestive heart failure, unspecified heart failure type (HCC) [I50.9] Patient Active Problem List   Diagnosis Date Noted   Acute heart failure with preserved ejection fraction (HFpEF) (HCC) 04/06/2023   Mitral valve disease 04/06/2023   Mitral valve stenosis and regurgitation 04/06/2023   Moderate to severe aortic stenosis 04/06/2023   Non-insulin dependent type 2 diabetes mellitus (HCC) 04/06/2023   Long term (current) use of anticoagulants 04/06/2023   Acute on chronic diastolic (congestive) heart failure (HCC) 04/05/2023   Pyohydronephrosis 10/27/2021   Chronic pain syndrome 10/27/2021   DM2 (diabetes mellitus, type 2) (HCC) 10/27/2021   HTN (hypertension) 10/27/2021   Hypokalemia 10/27/2021   Other long term (current) drug therapy 10/12/2021   Atrial fibrillation (HCC)    Chronic narcotic use 11/03/2020   Pressure injury of skin 01/22/2018   Acute pyelonephritis    Kidney stone    Sepsis (HCC) 01/19/2018   Rheumatoid arthritis (HCC) 09/21/2017   PCP:  Noberto Retort, MD Pharmacy:   [facility] [address], [place] - [address] [place] [address] Phone: [phone] Fax: [phone]     Social Determinants of Health (SDOH) Social History: SDOH Screenings   Food Insecurity: No Food Insecurity (04/05/2023)  Housing: Low Risk  (04/05/2023)  Transportation Needs: No Transportation Needs (04/05/2023)  Utilities: Not At Risk (04/05/2023)  Tobacco Use: Medium Risk (04/05/2023)   SDOH Interventions:     Readmission Risk Interventions      No data to display

## 2023-04-10 NOTE — Plan of Care (Signed)
  Problem: Activity: Goal: Risk for activity intolerance will decrease Outcome: Progressing   Problem: Nutrition: Goal: Adequate nutrition will be maintained Outcome: Progressing   Problem: Elimination: Goal: Will not experience complications related to urinary retention Outcome: Progressing   Problem: Skin Integrity: Goal: Risk for impaired skin integrity will decrease Outcome: Progressing   

## 2023-04-10 NOTE — Progress Notes (Addendum)
Patient ID: [name], female   DOB: 03-Oct-1950, 72 y.o.   MRN: [id number]     Advanced Heart Failure Rounding Note  PCP-Cardiologist: Sunit Tolia, DO   Subjective:    I/Os incomplete. Several unmeasured urinary occurences but wt is down 5 lb from yesterday w/ IV Lasix. Breathing improved.   SCr trending back down, 1.09>>2.03>>1.73 today  K 4.2 Mg 1.4   Maintaining NSR.   Denies CP. Breathing improved. Remains on abx for UTI. No dysuria. Hungry and asking for food. Denies any further hallucinations.     TEE 12/9:  LVEF 65-70%, significant concentric LVH, RV mod reduced, severe BAE AoV: heavily calcified w/ markedly restricted leaflet movement mG 24 mmHg, AVA 1.14cm2, mod AI  MV: immobile posterior leaflet, severe MS mG 9-10 mmHg. Mod-severe MR TR: Mod TR    Objective:   Weight Range: 53.9 kg Body mass index is 21.06 kg/m.   Vital Signs:   Temp:  [97.6 F (36.4 C)-98.2 F (36.8 C)] 98.1 F (36.7 C) (12/09 0823) Pulse Rate:  [42-80] 66 (12/09 0656) Resp:  [11-20] 11 (12/09 0656) BP: (101-158)/(49-75) 158/75 (12/09 0656) SpO2:  [92 %-97 %] 97 % (12/09 0656) Weight:  [53.9 kg] 53.9 kg (12/09 0447) Last BM Date : 04/09/23  Weight change: Filed Weights   04/08/23 0433 04/09/23 0337 04/10/23 0447  Weight: 56.6 kg 56 kg 53.9 kg    Intake/Output:   Intake/Output Summary (Last 24 hours) at 04/10/2023 0910 Last data filed at 04/10/2023 0810 Gross per 24 hour  Intake 1244.59 ml  Output 900 ml  Net 344.59 ml      Physical Exam    General:  elderly/ frail, chronically ill appearing WF. No distress  HEENT: Normal Neck: Supple. JVP 7-8. Carotids 2+ bilat; no bruits. No lymphadenopathy or thyromegaly appreciated. Cor: PMI nondisplaced. Regular rate & rhythm. 2/6 SEM RUSB. Lungs: decreased BS at the bases bilaterally  Abdomen: Soft, nontender, nondistended. No hepatosplenomegaly. No bruits or masses. Good bowel sounds. Extremities: No cyanosis, clubbing, rash.  Trace b/l LE edema to knees. + TEE hoses  Neuro: Alert & orientedx3, cranial nerves grossly intact. moves all 4 extremities w/o difficulty. Affect pleasant   Telemetry   NSR 60s (personally reviewed)  Labs    CBC Recent Labs    04/08/23 0207 04/09/23 0842  WBC 11.8* 11.9*  HGB 9.2* 9.5*  HCT 31.5* 32.7*  MCV 80.2 79.4*  PLT 296 272   Basic Metabolic Panel Recent Labs    78/29/56 0842 04/09/23 1903 04/10/23 0222  NA 131* 133* 133*  K 3.5 4.1 4.2  CL 90* 90* 91*  CO2 28 24 21*  GLUCOSE 303* 204* 194*  BUN 41* 47* 51*  CREATININE 1.09* 2.03* 1.73*  CALCIUM 8.5* 8.5* 8.5*  MG 1.3*  --  1.4*   Liver Function Tests No results for input(s): "AST", "ALT", "ALKPHOS", "BILITOT", "PROT", "ALBUMIN" in the last 72 hours. No results for input(s): "LIPASE", "AMYLASE" in the last 72 hours. Cardiac Enzymes No results for input(s): "CKTOTAL", "CKMB", "CKMBINDEX", "TROPONINI" in the last 72 hours.  BNP: BNP (last 3 results) Recent Labs    04/05/23 1703  BNP 1,498.3*    ProBNP (last 3 results) No results for input(s): "PROBNP" in the last 8760 hours.   D-Dimer No results for input(s): "DDIMER" in the last 72 hours. Hemoglobin A1C No results for input(s): "HGBA1C" in the last 72 hours. Fasting Lipid Panel No results for input(s): "CHOL", "HDL", "LDLCALC", "TRIG", "CHOLHDL", "LDLDIRECT" in  the last 72 hours. Thyroid Function Tests No results for input(s): "TSH", "T4TOTAL", "T3FREE", "THYROIDAB" in the last 72 hours.  Invalid input(s): "FREET3"  Other results:   Imaging    EP STUDY  Result Date: 04/10/2023 See surgical note for result.    Medications:     Scheduled Medications:  buPROPion  150 mg Oral QPM   cephALEXin  500 mg Oral Q8H   diltiazem  300 mg Oral Daily   escitalopram  10 mg Oral QPM   folic acid  2 mg Oral QPM   furosemide  80 mg Intravenous BID   hydroxychloroquine  200 mg Oral QPM   insulin aspart  0-9 Units Subcutaneous TID WC    levothyroxine  175 mcg Oral QPM   metoprolol succinate  25 mg Oral BID   morphine  30 mg Oral Q12H   pravastatin  40 mg Oral QPM   sodium chloride flush  3 mL Intravenous Q12H   spironolactone  12.5 mg Oral Daily    Infusions:  heparin 1,000 Units/hr (04/10/23 0746)    PRN Medications: acetaminophen, hydrocortisone, metoprolol tartrate, ondansetron (ZOFRAN) IV, oxyCODONE-acetaminophen, polyvinyl alcohol, sodium chloride flush     Assessment/Plan   1. Acute HFpEF w/prominent RV dysfunction and valvular heart disease: I reviewed this admission's echo, on my read it showed EF vigorous 65-70% with underfilled LV, LV intracavitary gradient peak 21 mmHg, moderate LVH, grade III DD, interventricular septum flattened in systole and diastole c/w RV pressure and volume overlad, RV mildly reduced systolic function and mildly dilated, RVSP 78 mmHg, severe BAE, moderate MR, severe MS with mean gradient of 21 mmHg, moderate-severe paradoxical low flow low gradient AS with mean gradient of 19 mmHg and AVA 0.78 cm2, mild-moderate AI, moderate TR.  Echo from 4/23 also showed valvular disease.  Admitted w/ volume overload and NYHA class IIIb symptoms at home. Diuresing well w/ IV Lasix, down 7 lb since admit. Appears euvolemic on exam  - hold IV Lasix for now. Will need [facility] for surgical staging. Anticipate tomorrow if SCr improved  - Continue spironolactone 12.5 daily.  - No SGLT2 inhibitor with UTI.  3. Valvular heart disease: She has severe mitral stenosis with moderate MR and a heavily calcified MV.  There is moderate-severe paradoxical low flow/low gradient AS with mean gradient 19 mmHg, AVA 0.79 cm^2.  She also has moderate TR.  Etiology is possibly rheumatic versus due to prior left-sided radiation for breast cancer in 1990s versus a combination. Her valvular disease is quite severe. TEE 12/9 showed severely calcified AoV w/ restricted leaflet movement and moderate AS mean gradient 24 mmHG w/ AVA 1.14  cm2, mod AI, severe MS w/ mean gradient 9-10 mmHg and restricted posterior leaflet and moderate TR    -Ideally would have MV and aortic valve replacements, tricuspid valve repair, and Maze with LA appendage clipping.  - will ask CT surgery to assess. If candidate for surgery, will need R/LHC  - If she has cardiac surgery, I can foresee potential problems with her hyperdynamic LV leading to underfilling and hypotension post-operatively.  Will continue to use diltiazem CD Toprol XL 25 mg bid (increased dose) to try to decrease contractility.  - if deemed too high risk for surgery will need to consider palliative care   3. Atrial fibrillation: Paroxysmal.  In setting of severe mitral valve disease (stenosis/regurgitation).  Severe biatrial enlargement on echo/TEE.  She is in NSR currently.  - Heparin gtt for now, was on warfarin at home.  -  If planned for surgery, would start amiodarone pre-op given high risk for post-op atrial fibrillation.  4. RA: On Orencia/Plaquenil  5. Hypothyroidism - on Synthroid 175  6. Type 2DM:  Poorly controlled, Hgb A1c 9.0.  - metformin discontinued for now  - SSI  - No SGLT2 inhibitor with UTI.  7. UTI: Urine culture has grown E coli.   - Cephalexin.  8. Visual hallucinations: Saw small white dog and child in her room the other night. Very clear this morning. Denies any further hallucinations.  Suspect mild delirium overnight. Follow closely.   Length of Stay: [address], PA-C  04/10/2023, 9:10 AM  Advanced Heart Failure Team Pager [phone] (M-F; 7a - 5p)  Please contact [facility] for night-coverage after hours (5p -7a ) and weekends on [email or web address]  Patient seen and examined with the above-signed Advanced Practice Provider and/or Housestaff. I personally reviewed laboratory data, imaging studies and relevant notes. I independently examined the patient and formulated the important aspects of the plan. I have edited the note to reflect any of my  changes or salient points. I have personally discussed the plan with the patient and/or family.  Breathing better today. Denies orthopnea or PND.   TEE today with severe MS and AS.   General:  Elderly No resp difficulty HEENT: normal Neck: supple. JVP 8-9. Carotids 2+ bilat; no bruits. No lymphadenopathy or thryomegaly appreciated. Cor:. Regular rate & rhythm. 2/6 AS 2/6 MR Lungs: coarse Abdomen: soft, nontender, nondistended. No hepatosplenomegaly. No bruits or masses. Good bowel sounds. Extremities: no cyanosis, clubbing, rash,trace  edema Neuro: alert & orientedx3, cranial nerves grossly intact. moves all 4 extremities w/o difficulty. Affect pleasant  I have reviewed case and TEE with TCTS. Unfortunately not surgical candidate with severe RV dysfunction and fraility. I will review with structural team but doubt TAVR will be sufficient as main issue seems to be her MV. Will continue attempts to optimize medically.   Arvilla Meres, MD  5:54 PM

## 2023-04-10 NOTE — Care Management Important Message (Signed)
Important Message  Patient Details  Name: [name] MRN: [id number] Date of Birth: [birth year]   Important Message Given:  Yes - Medicare IM     Dorena Bodo 04/10/2023, 3:37 PM

## 2023-04-10 NOTE — Progress Notes (Signed)
Mobility Specialist Progress Note:    04/10/23 0925  Mobility  Activity Transferred to/from [facility];Ambulated with assistance in room  Level of Assistance Contact guard assist, steadying assist  Assistive Device None  Distance Ambulated (ft) 4 ft  Activity Response Tolerated well  Mobility Referral Yes  Mobility visit 1 Mobility  Mobility Specialist Start Time (ACUTE ONLY) [id number]  Mobility Specialist Stop Time (ACUTE ONLY) [id number]  Mobility Specialist Time Calculation (min) (ACUTE ONLY) 5 min   Pt received on BSC, requesting assistance back to bed. NT performed peri care. Pt left in bed with NT present.  D'Vante Earlene Plater Mobility Specialist Please contact via Special educational needs teacher or Rehab office at [phone]

## 2023-04-10 NOTE — Plan of Care (Signed)
  Problem: Education: Goal: Knowledge of General Education information will improve Description: Including pain rating scale, medication(s)/side effects and non-pharmacologic comfort measures Outcome: Progressing   Problem: Clinical Measurements: Goal: Ability to maintain clinical measurements within normal limits will improve Outcome: Progressing Goal: Will remain free from infection Outcome: Progressing Goal: Diagnostic test results will improve Outcome: Progressing Goal: Respiratory complications will improve Outcome: Progressing Goal: Cardiovascular complication will be avoided Outcome: Progressing   Problem: Activity: Goal: Risk for activity intolerance will decrease Outcome: Progressing   Problem: Nutrition: Goal: Adequate nutrition will be maintained Outcome: Progressing   Problem: Elimination: Goal: Will not experience complications related to bowel motility Outcome: Progressing Goal: Will not experience complications related to urinary retention Outcome: Progressing   Problem: Pain Management: Goal: General experience of comfort will improve Outcome: Progressing   Problem: Safety: Goal: Ability to remain free from injury will improve Outcome: Progressing   Problem: Skin Integrity: Goal: Risk for impaired skin integrity will decrease Outcome: Progressing   Problem: Fluid Volume: Goal: Ability to maintain a balanced intake and output will improve Outcome: Progressing   Problem: Metabolic: Goal: Ability to maintain appropriate glucose levels will improve Outcome: Progressing   Problem: Nutritional: Goal: Maintenance of adequate nutrition will improve Outcome: Progressing Goal: Progress toward achieving an optimal weight will improve Outcome: Progressing   Problem: Skin Integrity: Goal: Risk for impaired skin integrity will decrease Outcome: Progressing   Problem: Tissue Perfusion: Goal: Adequacy of tissue perfusion will improve Outcome:  Progressing   Problem: Education: Goal: Ability to demonstrate management of disease process will improve Outcome: Progressing Goal: Ability to verbalize understanding of medication therapies will improve Outcome: Progressing Goal: Individualized Educational Video(s) Outcome: Progressing

## 2023-04-10 NOTE — CV Procedure (Signed)
    TRANSESOPHAGEAL ECHOCARDIOGRAM   NAME:  [name]   MRN: [id number] DOB:  07-25-1950   ADMIT DATE: 04/05/2023  INDICATIONS:  Aortic stenosis/mitral stenosis  PROCEDURE:   Informed consent was obtained prior to the procedure. The risks, benefits and alternatives for the procedure were discussed and the patient comprehended these risks.  Risks include, but are not limited to, cough, sore throat, vomiting, nausea, somnolence, esophageal and stomach trauma or perforation, bleeding, low blood pressure, aspiration, pneumonia, infection, trauma to the teeth and death.    After a procedural time-out, the patient was sedated by the anesthesia service. Once an appropriate level of sedation was achieved, the transesophageal probe was inserted in the esophagus and stomach without difficulty and multiple views were obtained.    COMPLICATIONS:    There were no immediate complications.  FINDINGS:  LEFT VENTRICLE: EF = 65-70%.Significant concentric LVH   RIGHT VENTRICLE: Mildly dilated Moderately decreased function  LEFT ATRIUM: Severely dilated. Significant systolic flow reversal in pulmonary veins  LEFT ATRIAL APPENDAGE: No thrombus.   RIGHT ATRIUM: Severely dilated  AORTIC VALVE:  Trileaflet. Heavily calcified with markedly restricted leaflet movement. Mean gradient 24 with calculated AVA 1.14cm2. AVA by planimetry was 0,45cm2. Consistent with moderate to severe AS.  Moderate AI.   MITRAL VALVE:   Heavily calcified. Posterior leaflet immobile. Severe MS with mean gradient 9-73mmHG. Moderate to severe MR  TRICUSPID VALVE: Normal. Moderate TR  PULMONIC VALVE: Grossly normal. Mild PI  INTERATRIAL SEPTUM: No PFO or ASD.  PERICARDIUM: No effusion  DESCENDING AORTA: Severe plaque    Vencent Hauschild,MD 8:20 AM

## 2023-04-10 NOTE — Progress Notes (Signed)
Arrived to cath lab holding w/heparin drip infusing at 1000 units/hour to rt wrist.

## 2023-04-10 NOTE — Anesthesia Postprocedure Evaluation (Signed)
Anesthesia Post Note  Patient: [name]  Procedure(s) Performed: TRANSESOPHAGEAL ECHOCARDIOGRAM     Patient location during evaluation: PACU Anesthesia Type: MAC Level of consciousness: awake and alert Pain management: pain level controlled Vital Signs Assessment: post-procedure vital signs reviewed and stable Respiratory status: spontaneous breathing, nonlabored ventilation, respiratory function stable and patient connected to nasal cannula oxygen Cardiovascular status: stable and blood pressure returned to baseline Postop Assessment: no apparent nausea or vomiting Anesthetic complications: no   No notable events documented.  Last Vitals:  Vitals:   04/10/23 1008 04/10/23 1124  BP: (!) 158/75 (!) 156/78  Pulse: 66 79  Resp: 16 14  Temp:  36.6 C  SpO2:      Last Pain:  Vitals:   04/10/23 1221  TempSrc:   PainSc: 8                  Trevor Iha

## 2023-04-10 NOTE — Interval H&P Note (Signed)
History and Physical Interval Note:  04/10/2023 7:34 AM  [name]  has presented today for surgery, with the diagnosis of mitral stenosis.  The various methods of treatment have been discussed with the patient and family. After consideration of risks, benefits and other options for treatment, the patient has consented to  Procedure(s): TRANSESOPHAGEAL ECHOCARDIOGRAM (N/A) as a surgical intervention.  The patient's history has been reviewed, patient examined, no change in status, stable for surgery.  I have reviewed the patient's chart and labs.  Questions were answered to the patient's satisfaction.     Leightyn Cina

## 2023-04-10 NOTE — Progress Notes (Addendum)
PROGRESS NOTE  [name]  BJY:[id number] DOB: 22-Oct-1950 DOA: 04/05/2023 PCP: Noberto Retort, MD   Brief Narrative: 72/F w/RA, breast cancer status postlumpectomy and chemoradiation with subsequent left mastectomy, paroxysmal A-fib on Coumadin, moderate MR/severe MS, diabetes type 2, hypertension, hyperlipidemia, hypothyroidism who presented with 1 to 2 weeks history of progressive exertional dyspnea, dizziness.  -On admission noted to be volume overloaded echo showed severe mitral stenosis, mod MR, Mod to Severe AS.  Cardiology consulted.   -diuresed,  -12/9: TEE:  Assessment & Plan:  Severe mitral stenosis, moderate MR  moderate to severe aortic stenosis -2D echo noted EF of 65-70%, moderate LVH, grade 3 DD, RV pressure and volume overload, mildly reduced RV function severe mitral stenosis, moderate MR, moderate to severe low-flow low gradient aortic stenosis  -Diuresed on IV Lasix, volume status is improved  -Heart failure team following, Lasix held today  -TEE today confirmed severe mitral stenosis, moderate to severe aortic stenosis, CT surgery consulted  Acute diastolic CHF:  Severe pulmonary hypertension  -Echo with preserved EF and valvular heart disease as noted above, -Volume status improved, possibly right and left heart cath tomorrow -Lasix on hold, continue Aldactone  Paroxysmal A-fib: History of DCCV in April 2023.  Currently in normal sinus rhythm.  On IV heparin for anticoagulation.  Takes Coumadin at home.  Currently also on diltiazem 300 mg daily.  Type 2 diabetes: Recent A1c of 9%.  Continue current insulin regimen.  Monitor blood sugars  Severe hypomagnesemia: Continue to monitor and supplement.  Hypothyroidism: Continue Synthyroid.  Diabetes type 2: On Orencia/Plaquenil.  Plaquenil has been continued here.  Follows with rheumatology  Depression: On Wellbutrin/Lexapro  UTI/leukocytosis: Urine culture showed significant E. coli.  Started on Keflex.   Continue to complete 5-day course  Normocytic anemia: Current hemoglobin stable in the range of 9.  Chronic pain syndrome: On MS Contin, oxycodone.    Pressure Injury 04/05/23 Buttocks Right;Left Stage 2 -  Partial thickness loss of dermis presenting as a shallow open injury with a red, pink wound bed without slough. (Active)  04/05/23 2200  Location: Buttocks  Location Orientation: Right;Left  Staging: Stage 2 -  Partial thickness loss of dermis presenting as a shallow open injury with a red, pink wound bed without slough.  Wound Description (Comments):   Present on Admission: Yes  Dressing Type Foam - Lift dressing to assess site every shift 04/10/23 0900    DVT prophylaxis:iv heparin CODE STATUS: DNR  family Communication: None present Disposition: Home Consultants: Cardiology  Procedures: None yet  Antimicrobials:  Anti-infectives (From admission, onward)    Start     Dose/Rate Route Frequency Ordered Stop   04/10/23 2200  cephALEXin (KEFLEX) capsule 500 mg        500 mg Oral Every 12 hours 04/10/23 0914 04/13/23 2159   04/08/23 1400  cephALEXin (KEFLEX) capsule 500 mg  Status:  Discontinued        500 mg Oral Every 8 hours 04/08/23 1140 04/10/23 0914   04/08/23 1145  ciprofloxacin (CIPRO) tablet 250 mg  Status:  Discontinued        250 mg Oral 2 times daily 04/08/23 1058 04/08/23 1140   04/06/23 1800  hydroxychloroquine (PLAQUENIL) tablet 200 mg        200 mg Oral Every evening 04/06/23 0715         Subjective: -Feels okay, no complaints overnight, breathing is improving  Objective: Vitals:   04/10/23 0823 04/10/23 0900 04/10/23 1008 04/10/23 1124  BP:   (!) 158/75 (!) 156/78  Pulse:   66 79  Resp:  16 16 14   Temp: 98.1 F (36.7 C)   97.8 F (36.6 C)  TempSrc: Temporal   Oral  SpO2:      Weight:      Height:        Intake/Output Summary (Last 24 hours) at 04/10/2023 1345 Last data filed at 04/10/2023 0810 Gross per 24 hour  Intake 1144.59 ml  Output  300 ml  Net 844.59 ml   Filed Weights   04/08/23 0433 04/09/23 0337 04/10/23 0447  Weight: 56.6 kg 56 kg 53.9 kg    Examination:  Gen: Frail, cachectic elderly female with severe kyphoscoliosis HEENT: No JVD CVS: S1-S2, regular rhythm, systolic ejection murmur Lungs: Poor air movement, decreased breath sounds at the bases Abdomen: Soft, nontender, bowel sounds present Extremities: 1+ edema, TED hose on Skin: no new rashes on exposed skin    Data Reviewed: I have personally reviewed following labs and imaging studies  CBC: Recent Labs  Lab 04/05/23 1703 04/06/23 0425 04/07/23 0432 04/08/23 0207 04/09/23 0842  WBC 12.5* 9.9 8.5 11.8* 11.9*  NEUTROABS 10.9*  --   --   --   --   HGB 9.9* 9.3* 9.3* 9.2* 9.5*  HCT 35.2* 32.9* 32.9* 31.5* 32.7*  MCV 83.8 83.3 83.1 80.2 79.4*  PLT 330 263 254 296 272   Basic Metabolic Panel: Recent Labs  Lab 04/06/23 0425 04/07/23 0432 04/08/23 0207 04/08/23 0805 04/09/23 0842 04/09/23 1903 04/10/23 0222  NA 137 136 133*  --  131* 133* 133*  K 3.5 3.6 4.1  --  3.5 4.1 4.2  CL 98 95* 90*  --  90* 90* 91*  CO2 27 25 23   --  28 24 21*  GLUCOSE 155* 159* 151*  --  303* 204* 194*  BUN 16 24* 33*  --  41* 47* 51*  CREATININE 0.64 0.68 1.09*  --  1.09* 2.03* 1.73*  CALCIUM 8.6* 8.5* 8.8*  --  8.5* 8.5* 8.5*  MG 1.9  --  1.1* 1.9 1.3*  --  1.4*  PHOS 4.6  --   --   --   --   --   --      Recent Results (from the past 240 hour(s))  Urine Culture     Status: Abnormal   Collection Time: 04/05/23  5:57 PM   Specimen: Urine, Random  Result Value Ref Range Status   Specimen Description   Final    URINE, RANDOM Performed at [facility], [address], [place], [place] [address]    Special Requests   Final    NONE Reflexed from [phone] Performed at [facility], [address] [place], [place] [address]    Culture >=100,000 COLONIES/mL ESCHERICHIA COLI (A)  Final   Report Status 04/07/2023 FINAL   Final   Organism ID, Bacteria ESCHERICHIA COLI (A)  Final      Susceptibility   Escherichia coli - MIC*    AMPICILLIN <=2 SENSITIVE Sensitive     CEFAZOLIN <=4 SENSITIVE Sensitive     CEFEPIME <=0.12 SENSITIVE Sensitive     CEFTRIAXONE <=0.25 SENSITIVE Sensitive     CIPROFLOXACIN <=0.25 SENSITIVE Sensitive     GENTAMICIN <=1 SENSITIVE Sensitive     IMIPENEM <=0.25 SENSITIVE Sensitive     NITROFURANTOIN <=16 SENSITIVE Sensitive     TRIMETH/SULFA <=20 SENSITIVE Sensitive     AMPICILLIN/SULBACTAM <=2 SENSITIVE Sensitive  PIP/TAZO <=4 SENSITIVE Sensitive ug/mL    * >=100,000 COLONIES/mL ESCHERICHIA COLI     Radiology Studies: ECHO TEE  Result Date: 04/10/2023    TRANSESOPHOGEAL ECHO REPORT   Patient Name:   [name] Date of Exam: 04/10/2023 Medical Rec #:  [id number]        Height:       63.0 in Accession #:    [id number]       Weight:       118.9 lb Date of Birth:  17-Feb-1951         BSA:          1.550 m Patient Age:    72 years         BP:           145/67 mmHg Patient Gender: F                HR:           71 bpm. Exam Location:  Inpatient Procedure: Transesophageal Echo, 3D Echo, Cardiac Doppler and Color Doppler Indications:     I35.0 Nonrheumatic aortic (valve) stenosis; I34.2 Nonrheumatic                  mitral (valve) stenosis; I34.0 Nonrheumatic mitral (valve)                  insufficiency  History:         Patient has prior history of Echocardiogram examinations, most                  recent 04/06/2023. Abnormal ECG, Aortic Valve Disease and Mitral                  Valve Disease, Arrythmias:Atrial Fibrillation; Risk                  Factors:Hypertension and Diabetes. Aortic stenosis. Mitral                  stenosis.  Sonographer:     Sheralyn Boatman RDCS Referring Phys:  [id number] [facility] Diagnosing Phys: Arvilla Meres MD PROCEDURE: After discussion of the risks and benefits of a TEE, an informed consent was obtained from the patient. The transesophogeal probe was passed  without difficulty through the esophogus of the patient. Imaged were obtained with the patient in a left lateral decubitus position. Local oropharyngeal anesthetic was provided with viscous lidocaine. Sedation performed by different physician. The patient was monitored while under deep sedation. Anesthestetic sedation was provided intravenously by Anesthesiology: 156mg  of Propofol. The patient's vital signs; including heart rate, blood pressure, and oxygen saturation; remained stable throughout the procedure. The patient developed no complications during the procedure.  IMPRESSIONS  1. Left ventricular ejection fraction, by estimation, is 70 to 75%. The left ventricle has hyperdynamic function.  2. Right ventricular systolic function is severely reduced. The right ventricular size is mildly enlarged.  3. Left atrial size was severely dilated. No left atrial/left atrial appendage thrombus was detected.  4. Right atrial size was moderately dilated.  5. The MV and MV annulus are heavily calcified. The posterior leaflet appears immobile. There is severe mitral stenosis. There is moderate to severe MR with prominent systolic flow reversal in the pulmonary veins. MVA 0.9 cm2. Mean gradient 9-10. 3D MV EROA is 0.56. The mitral valve is degenerative. Moderate to severe mitral valve regurgitation. Severe mitral stenosis. The mean mitral valve gradient is 9.0 mmHg.  6. Tricuspid valve regurgitation  is moderate.  7. The aortic valve is heavily calcified with nearly immobile leaflets. Mean gradient ranges 20-24. AVA by VTI was 1.14cm2 with VR 0.33. AVA by planimetry in 0.46cm2. The aortic valve is tricuspid. There is moderate calcification of the aortic valve. Aortic valve regurgitation is moderate. Moderate to severe aortic valve stenosis. Aortic valve area, by VTI measures 1.17 cm. Aortic valve mean gradient measures 21.7 mmHg. Aortic valve Vmax measures 2.88 m/s.  8. The left upper pulmonary vein is abnormal. FINDINGS  Left  Ventricle: Left ventricular ejection fraction, by estimation, is 70 to 75%. The left ventricle has hyperdynamic function. The left ventricular internal cavity size was normal in size. Right Ventricle: The right ventricular size is mildly enlarged. No increase in right ventricular wall thickness. Right ventricular systolic function is severely reduced. Left Atrium: Left atrial size was severely dilated. No left atrial/left atrial appendage thrombus was detected. Right Atrium: Right atrial size was moderately dilated. Pericardium: There is no evidence of pericardial effusion. Mitral Valve: The MV and MV annulus are heavily calcified. The posterior leaflet appears immobile. There is severe mitral stenosis. There is moderate to severe MR with prominent systolic flow reversal in the pulmonary veins. MVA 0.9 cm2. Mean gradient 9-10. 3D MV EROA is 0.56. The mitral valve is degenerative in appearance. There is severe calcification of the mitral valve leaflet(s). Severely decreased mobility of the mitral valve leaflets. Moderate to severe mitral valve regurgitation. Severe mitral  valve stenosis. MV peak gradient, 21.6 mmHg. The mean mitral valve gradient is 9.0 mmHg. Tricuspid Valve: The tricuspid valve is grossly normal. Tricuspid valve regurgitation is moderate . No evidence of tricuspid stenosis. Aortic Valve: The aortic valve is heavily calcified with nearly immobile leaflets. Mean gradient ranges 20-24. AVA by VTI was 1.14cm2 with VR 0.33. AVA by planimetry in 0.46cm2. The aortic valve is tricuspid. There is moderate calcification of the aortic  valve. Aortic valve regurgitation is moderate. Moderate to severe aortic stenosis is present. Aortic valve mean gradient measures 21.7 mmHg. Aortic valve peak gradient measures 33.2 mmHg. Aortic valve area, by VTI measures 1.17 cm. Pulmonic Valve: The pulmonic valve was normal in structure. Pulmonic valve regurgitation is trivial. Aorta: The aortic root and ascending aorta are  structurally normal, with no evidence of dilitation. Venous: The left upper pulmonary vein is abnormal. A pattern of systolic flow reversal, suggestive of severe mitral regurgitation is recorded from the left upper pulmonary vein. IAS/Shunts: No atrial level shunt detected by color flow Doppler. Additional Comments: Spectral Doppler performed. LEFT VENTRICLE PLAX 2D LVOT diam:     2.10 cm LV SV:         80 LV SV Index:   51 LVOT Area:     3.46 cm  AORTIC VALVE AV Area (Vmax):    1.14 cm AV Area (Vmean):   1.12 cm AV Area (VTI):     1.17 cm AV Vmax:           288.00 cm/s AV Vmean:          219.667 cm/s AV VTI:            0.679 m AV Peak Grad:      33.2 mmHg AV Mean Grad:      21.7 mmHg LVOT Vmax:         94.40 cm/s LVOT Vmean:        71.100 cm/s LVOT VTI:          0.230 m LVOT/AV VTI ratio: 0.34  AORTA Ao Asc diam: 3.30 cm MITRAL VALVE                  TRICUSPID VALVE MV Area VTI:  0.90 cm        TR Peak grad:   43.0 mmHg MV Peak grad: 21.6 mmHg       TR Vmax:        328.00 cm/s MV Mean grad: 9.0 mmHg MV Vmax:      2.33 m/s        SHUNTS MV Vmean:     146.0 cm/s      Systemic VTI:  0.23 m MR Peak grad:    147.6 mmHg   Systemic Diam: 2.10 cm MR Mean grad:    101.0 mmHg MR Vmax:         607.50 cm/s MR Vmean:        469.5 cm/s MR PISA:         2.26 cm MR PISA Eff ROA: 15 mm MR PISA Radius:  0.60 cm Arvilla Meres MD Electronically signed by Arvilla Meres MD Signature Date/Time: 04/10/2023/1:20:05 PM    Final    EP STUDY  Result Date: 04/10/2023 See surgical note for result.   Scheduled Meds:  buPROPion  150 mg Oral QPM   cephALEXin  500 mg Oral Q12H   diltiazem  300 mg Oral Daily   escitalopram  10 mg Oral QPM   folic acid  2 mg Oral QPM   hydroxychloroquine  200 mg Oral QPM   insulin aspart  0-9 Units Subcutaneous TID WC   levothyroxine  175 mcg Oral QPM   metoprolol succinate  25 mg Oral BID   morphine  30 mg Oral Q12H   pravastatin  40 mg Oral QPM   sodium chloride flush  3 mL Intravenous  Q12H   spironolactone  12.5 mg Oral Daily   Continuous Infusions:  heparin Stopped (04/10/23 1339)     LOS: 5 days   Zannie Cove, MD [facility] P12/01/2023, 1:45 PM

## 2023-04-11 DIAGNOSIS — I5033 Acute on chronic diastolic (congestive) heart failure: Secondary | ICD-10-CM | POA: Diagnosis not present

## 2023-04-11 LAB — CBC
HCT: 31.3 % — ABNORMAL LOW (ref 36.0–46.0)
Hemoglobin: 9.3 g/dL — ABNORMAL LOW (ref 12.0–15.0)
MCH: 23.5 pg — ABNORMAL LOW (ref 26.0–34.0)
MCHC: 29.7 g/dL — ABNORMAL LOW (ref 30.0–36.0)
MCV: 79 fL — ABNORMAL LOW (ref 80.0–100.0)
Platelets: 249 10*3/uL (ref 150–400)
RBC: 3.96 MIL/uL (ref 3.87–5.11)
RDW: 17.5 % — ABNORMAL HIGH (ref 11.5–15.5)
WBC: 10.2 10*3/uL (ref 4.0–10.5)
nRBC: 0 % (ref 0.0–0.2)

## 2023-04-11 LAB — BASIC METABOLIC PANEL
Anion gap: 13 (ref 5–15)
BUN: 40 mg/dL — ABNORMAL HIGH (ref 8–23)
CO2: 31 mmol/L (ref 22–32)
Calcium: 9 mg/dL (ref 8.9–10.3)
Chloride: 88 mmol/L — ABNORMAL LOW (ref 98–111)
Creatinine, Ser: 1.09 mg/dL — ABNORMAL HIGH (ref 0.44–1.00)
GFR, Estimated: 54 mL/min — ABNORMAL LOW (ref >60)
Glucose, Bld: 166 mg/dL — ABNORMAL HIGH (ref 70–99)
Potassium: 3.5 mmol/L (ref 3.5–5.1)
Sodium: 132 mmol/L — ABNORMAL LOW (ref 135–145)

## 2023-04-11 LAB — FERRITIN: Ferritin: 15 ng/mL (ref 11–307)

## 2023-04-11 LAB — HEPARIN LEVEL (UNFRACTIONATED): Heparin Unfractionated: 0.45 [IU]/mL (ref 0.30–0.70)

## 2023-04-11 LAB — RETICULOCYTES
Immature Retic Fract: 32.7 % — ABNORMAL HIGH (ref 2.3–15.9)
RBC.: 3.94 MIL/uL (ref 3.87–5.11)
Retic Count, Absolute: 79.6 10*3/uL (ref 19.0–186.0)
Retic Ct Pct: 2 % (ref 0.4–3.1)

## 2023-04-11 LAB — IRON AND TIBC
Iron: 15 ug/dL — ABNORMAL LOW (ref 28–170)
Saturation Ratios: 3 % — ABNORMAL LOW (ref 10.4–31.8)
TIBC: 532 ug/dL — ABNORMAL HIGH (ref 250–450)
UIBC: 517 ug/dL

## 2023-04-11 LAB — GLUCOSE, CAPILLARY
Glucose-Capillary: 170 mg/dL — ABNORMAL HIGH (ref 70–99)
Glucose-Capillary: 317 mg/dL — ABNORMAL HIGH (ref 70–99)
Glucose-Capillary: 148 mg/dL — ABNORMAL HIGH (ref 70–99)
Glucose-Capillary: 197 mg/dL — ABNORMAL HIGH (ref 70–99)

## 2023-04-11 LAB — VITAMIN B12: Vitamin B-12: 647 pg/mL (ref 180–914)

## 2023-04-11 LAB — FOLATE: Folate: 38.7 ng/mL (ref >5.9)

## 2023-04-11 LAB — MAGNESIUM: Magnesium: 1.8 mg/dL (ref 1.7–2.4)

## 2023-04-11 MED ORDER — FUROSEMIDE 10 MG/ML IJ SOLN
80.0000 mg | Freq: Once | INTRAMUSCULAR | Status: AC
  Administered 2023-04-11: 80 mg via INTRAVENOUS
  Filled 2023-04-11: qty 8

## 2023-04-11 MED ORDER — WARFARIN SODIUM 5 MG PO TABS
5.0000 mg | ORAL_TABLET | Freq: Once | ORAL | Status: AC
  Administered 2023-04-11: 5 mg via ORAL
  Filled 2023-04-11: qty 1

## 2023-04-11 MED ORDER — INSULIN GLARGINE-YFGN 100 UNIT/ML ~~LOC~~ SOLN
15.0000 [IU] | Freq: Every day | SUBCUTANEOUS | Status: DC
  Administered 2023-04-11 – 2023-04-12 (×2): 15 [IU] via SUBCUTANEOUS
  Filled 2023-04-11 (×3): qty 0.15

## 2023-04-11 MED ORDER — POTASSIUM CHLORIDE CRYS ER 20 MEQ PO TBCR
40.0000 meq | EXTENDED_RELEASE_TABLET | Freq: Once | ORAL | Status: AC
  Administered 2023-04-11: 40 meq via ORAL
  Filled 2023-04-11: qty 2

## 2023-04-11 MED ORDER — ALUM & MAG HYDROXIDE-SIMETH 200-200-20 MG/5ML PO SUSP
15.0000 mL | Freq: Four times a day (QID) | ORAL | Status: DC | PRN
  Administered 2023-04-11: 15 mL via ORAL
  Filled 2023-04-11: qty 30

## 2023-04-11 MED ORDER — SPIRONOLACTONE 25 MG PO TABS
25.0000 mg | ORAL_TABLET | Freq: Every day | ORAL | Status: DC
  Administered 2023-04-11 – 2023-04-12 (×2): 25 mg via ORAL
  Filled 2023-04-11 (×3): qty 1

## 2023-04-11 MED ORDER — WARFARIN - PHARMACIST DOSING INPATIENT: Freq: Every day | Status: DC

## 2023-04-11 MED ORDER — MAGNESIUM SULFATE 2 GM/50ML IV SOLN
2.0000 g | Freq: Once | INTRAVENOUS | Status: AC
  Administered 2023-04-11: 2 g via INTRAVENOUS
  Filled 2023-04-11: qty 50

## 2023-04-11 NOTE — Progress Notes (Signed)
Patient restless and agitated.  Patient has not slept and is in and out of bed.  Ambulated patient in hall to help with restlessness.  Patient states she is hungry but refusing everything offered to patient.  Somewhat argumentative with staff and not following safety precautions.  Patient is oriented to self and place, intermittently confused to time and situation.

## 2023-04-11 NOTE — Progress Notes (Addendum)
PROGRESS NOTE  MAHLI COONROD  UVO:[id number] DOB: Jan 03, 1951 DOA: 04/05/2023 PCP: Noberto Retort, MD   Brief Narrative: 72/F w/RA, breast cancer status postlumpectomy and chemoradiation with subsequent left mastectomy, paroxysmal A-fib on Coumadin, moderate MR/severe MS, diabetes type 2, hypertension, hyperlipidemia, hypothyroidism who presented with 1 to 2 weeks history of progressive exertional dyspnea, dizziness.  -On admission noted to be volume overloaded echo showed severe mitral stenosis, mod MR, Mod to Severe AS.  Cardiology consulted.   -diuresed,  -12/9: TEE: Confirmed severe mitral stenosis and moderate to severe aortic stenosis, CHF team discussed with CT surgery, not a candidate for surgical valve repair  Assessment & Plan:  Severe mitral stenosis, moderate MR  Moderate to severe aortic stenosis -2D echo noted EF of 65-70%, moderate LVH, grade 3 DD, RV pressure and volume overload, mildly reduced RV function severe mitral stenosis, moderate MR, moderate to severe low-flow low gradient aortic stenosis  -Diuresed on IV Lasix, volume status has improved  -Heart failure team following,  -TEE 12/9 confirmed severe mitral stenosis, moderate to severe aortic stenosis, case reviewed with CT surgery, not a candidate for surgery, structural heart team to be consulted today  Acute diastolic CHF:  Severe pulmonary hypertension  -Echo with preserved EF and valvular heart disease as noted above, -Volume status improved -Lasix on hold, continue Aldactone  Paroxysmal A-fib: History of DCCV in April 2023.  -Currently in sinus rhythm on heparin gtt. at this time, Cardizem .  Acute on chronic anemia -Multifactorial, also check iron stores  Type 2 diabetes: Recent A1c of 9%.  -CBGs trending up, add Semglee  Severe hypomagnesemia: Continue to monitor and supplement.  Hypothyroidism: Continue Synthyroid.  Diabetes type 2: On Orencia/Plaquenil.  Plaquenil has been continued here.   Follows with rheumatology  Depression: On Wellbutrin/Lexapro  UTI/leukocytosis: Urine culture showed significant E. coli.  Started on Keflex.  Continue to complete 5-day course  Chronic pain syndrome: On MS Contin, oxycodone.    Pressure Injury 04/05/23 Buttocks Right;Left Stage 2 -  Partial thickness loss of dermis presenting as a shallow open injury with a red, pink wound bed without slough. (Active)  04/05/23 2200  Location: Buttocks  Location Orientation: Right;Left  Staging: Stage 2 -  Partial thickness loss of dermis presenting as a shallow open injury with a red, pink wound bed without slough.  Wound Description (Comments):   Present on Admission: Yes  Dressing Type Foam - Lift dressing to assess site every shift 04/11/23 0800    DVT prophylaxis:Place TED hose Start: 04/11/23 [id number] heparin warfarin (COUMADIN) tablet 5 mg CODE STATUS: DNR  family Communication: None present Disposition: Home Consultants: Cardiology  Procedures: None yet  Antimicrobials:  Anti-infectives (From admission, onward)    Start     Dose/Rate Route Frequency Ordered Stop   04/10/23 2200  cephALEXin (KEFLEX) capsule 500 mg        500 mg Oral Every 12 hours 04/10/23 0914 04/13/23 2159   04/08/23 1400  cephALEXin (KEFLEX) capsule 500 mg  Status:  Discontinued        500 mg Oral Every 8 hours 04/08/23 1140 04/10/23 0914   04/08/23 1145  ciprofloxacin (CIPRO) tablet 250 mg  Status:  Discontinued        250 mg Oral 2 times daily 04/08/23 1058 04/08/23 1140   04/06/23 1800  hydroxychloroquine (PLAQUENIL) tablet 200 mg        200 mg Oral Every evening 04/06/23 0715         Subjective: -  Feels okay, no complaints overnight, breathing is improving  Objective: Vitals:   04/11/23 0826 04/11/23 0843 04/11/23 1039 04/11/23 1122  BP: 137/74 137/74  (!) 152/71  Pulse:  64  68  Resp: 17   17  Temp:    97.6 F (36.4 C)  TempSrc:    Oral  SpO2:      Weight:   55.8 kg   Height:         Intake/Output Summary (Last 24 hours) at 04/11/2023 1154 Last data filed at 04/11/2023 1135 Gross per 24 hour  Intake 1014.78 ml  Output 2000 ml  Net -985.22 ml   Filed Weights   04/09/23 0337 04/10/23 0447 04/11/23 1039  Weight: 56 kg 53.9 kg 55.8 kg    Examination:  Frail cachectic elderly female with severe kyphoscoliosis HEENT: No JVD CVS: S1-S2, regular rhythm, systolic ejection murmur Lungs: Rare basilar Rales otherwise clear Abdomen: Soft, nontender, bowel sounds present Extremities: Trace edema, TED hose on Skin: no new rashes on exposed skin    Data Reviewed: I have personally reviewed following labs and imaging studies  CBC: Recent Labs  Lab 04/05/23 1703 04/06/23 0425 04/07/23 0432 04/08/23 0207 04/09/23 0842 04/11/23 0218  WBC 12.5* 9.9 8.5 11.8* 11.9* 10.2  NEUTROABS 10.9*  --   --   --   --   --   HGB 9.9* 9.3* 9.3* 9.2* 9.5* 9.3*  HCT 35.2* 32.9* 32.9* 31.5* 32.7* 31.3*  MCV 83.8 83.3 83.1 80.2 79.4* 79.0*  PLT 330 263 254 296 272 249   Basic Metabolic Panel: Recent Labs  Lab 04/06/23 0425 04/07/23 0432 04/08/23 0207 04/08/23 0805 04/09/23 0842 04/09/23 1903 04/10/23 0222 04/11/23 0218  NA 137   < > 133*  --  131* 133* 133* 132*  K 3.5   < > 4.1  --  3.5 4.1 4.2 3.5  CL 98   < > 90*  --  90* 90* 91* 88*  CO2 27   < > 23  --  28 24 21* 31  GLUCOSE 155*   < > 151*  --  303* 204* 194* 166*  BUN 16   < > 33*  --  41* 47* 51* 40*  CREATININE 0.64   < > 1.09*  --  1.09* 2.03* 1.73* 1.09*  CALCIUM 8.6*   < > 8.8*  --  8.5* 8.5* 8.5* 9.0  MG 1.9  --  1.1* 1.9 1.3*  --  1.4* 1.8  PHOS 4.6  --   --   --   --   --   --   --    < > = values in this interval not displayed.     Recent Results (from the past 240 hour(s))  Urine Culture     Status: Abnormal   Collection Time: 04/05/23  5:57 PM   Specimen: Urine, Random  Result Value Ref Range Status   Specimen Description   Final    URINE, RANDOM Performed at [facility], [address], [place], [place] [address]    Special Requests   Final    NONE Reflexed from [id number] Performed at [facility], [address] [place], [place] [address]    Culture >=100,000 COLONIES/mL ESCHERICHIA COLI (A)  Final   Report Status 04/07/2023 FINAL  Final   Organism ID, Bacteria ESCHERICHIA COLI (A)  Final      Susceptibility   Escherichia coli - MIC*    AMPICILLIN <=2 SENSITIVE Sensitive  CEFAZOLIN <=4 SENSITIVE Sensitive     CEFEPIME <=0.12 SENSITIVE Sensitive     CEFTRIAXONE <=0.25 SENSITIVE Sensitive     CIPROFLOXACIN <=0.25 SENSITIVE Sensitive     GENTAMICIN <=1 SENSITIVE Sensitive     IMIPENEM <=0.25 SENSITIVE Sensitive     NITROFURANTOIN <=16 SENSITIVE Sensitive     TRIMETH/SULFA <=20 SENSITIVE Sensitive     AMPICILLIN/SULBACTAM <=2 SENSITIVE Sensitive     PIP/TAZO <=4 SENSITIVE Sensitive ug/mL    * >=100,000 COLONIES/mL ESCHERICHIA COLI     Radiology Studies: ECHO TEE  Result Date: 04/10/2023    TRANSESOPHOGEAL ECHO REPORT   Patient Name:   SHARLITA FELTER Date of Exam: 04/10/2023 Medical Rec #:  [id number]        Height:       63.0 in Accession #:    [id number]       Weight:       118.9 lb Date of Birth:  June 15, 1950         BSA:          1.550 m Patient Age:    72 years         BP:           145/67 mmHg Patient Gender: F                HR:           71 bpm. Exam Location:  Inpatient Procedure: Transesophageal Echo, 3D Echo, Cardiac Doppler and Color Doppler Indications:     I35.0 Nonrheumatic aortic (valve) stenosis; I34.2 Nonrheumatic                  mitral (valve) stenosis; I34.0 Nonrheumatic mitral (valve)                  insufficiency  History:         Patient has prior history of Echocardiogram examinations, most                  recent 04/06/2023. Abnormal ECG, Aortic Valve Disease and Mitral                  Valve Disease, Arrythmias:Atrial Fibrillation; Risk                  Factors:Hypertension and Diabetes. Aortic stenosis.  Mitral                  stenosis.  Sonographer:     Sheralyn Boatman RDCS Referring Phys:  [id number] [facility] Diagnosing Phys: Arvilla Meres MD PROCEDURE: After discussion of the risks and benefits of a TEE, an informed consent was obtained from the patient. The transesophogeal probe was passed without difficulty through the esophogus of the patient. Imaged were obtained with the patient in a left lateral decubitus position. Local oropharyngeal anesthetic was provided with viscous lidocaine. Sedation performed by different physician. The patient was monitored while under deep sedation. Anesthestetic sedation was provided intravenously by Anesthesiology: 156mg  of Propofol. The patient's vital signs; including heart rate, blood pressure, and oxygen saturation; remained stable throughout the procedure. The patient developed no complications during the procedure.  IMPRESSIONS  1. Left ventricular ejection fraction, by estimation, is 70 to 75%. The left ventricle has hyperdynamic function.  2. Right ventricular systolic function is severely reduced. The right ventricular size is mildly enlarged.  3. Left atrial size was severely dilated. No left atrial/left atrial appendage thrombus was detected.  4. Right atrial size was moderately dilated.  5. The MV and MV annulus are heavily calcified. The posterior leaflet appears immobile. There is severe mitral stenosis. There is moderate to severe MR with prominent systolic flow reversal in the pulmonary veins. MVA 0.9 cm2. Mean gradient 9-10. 3D MV EROA is 0.56. The mitral valve is degenerative. Moderate to severe mitral valve regurgitation. Severe mitral stenosis. The mean mitral valve gradient is 9.0 mmHg.  6. Tricuspid valve regurgitation is moderate.  7. The aortic valve is heavily calcified with nearly immobile leaflets. Mean gradient ranges 20-24. AVA by VTI was 1.14cm2 with VR 0.33. AVA by planimetry in 0.46cm2. The aortic valve is tricuspid. There is moderate calcification  of the aortic valve. Aortic valve regurgitation is moderate. Moderate to severe aortic valve stenosis. Aortic valve area, by VTI measures 1.17 cm. Aortic valve mean gradient measures 21.7 mmHg. Aortic valve Vmax measures 2.88 m/s.  8. The left upper pulmonary vein is abnormal. FINDINGS  Left Ventricle: Left ventricular ejection fraction, by estimation, is 70 to 75%. The left ventricle has hyperdynamic function. The left ventricular internal cavity size was normal in size. Right Ventricle: The right ventricular size is mildly enlarged. No increase in right ventricular wall thickness. Right ventricular systolic function is severely reduced. Left Atrium: Left atrial size was severely dilated. No left atrial/left atrial appendage thrombus was detected. Right Atrium: Right atrial size was moderately dilated. Pericardium: There is no evidence of pericardial effusion. Mitral Valve: The MV and MV annulus are heavily calcified. The posterior leaflet appears immobile. There is severe mitral stenosis. There is moderate to severe MR with prominent systolic flow reversal in the pulmonary veins. MVA 0.9 cm2. Mean gradient 9-10. 3D MV EROA is 0.56. The mitral valve is degenerative in appearance. There is severe calcification of the mitral valve leaflet(s). Severely decreased mobility of the mitral valve leaflets. Moderate to severe mitral valve regurgitation. Severe mitral  valve stenosis. MV peak gradient, 21.6 mmHg. The mean mitral valve gradient is 9.0 mmHg. Tricuspid Valve: The tricuspid valve is grossly normal. Tricuspid valve regurgitation is moderate . No evidence of tricuspid stenosis. Aortic Valve: The aortic valve is heavily calcified with nearly immobile leaflets. Mean gradient ranges 20-24. AVA by VTI was 1.14cm2 with VR 0.33. AVA by planimetry in 0.46cm2. The aortic valve is tricuspid. There is moderate calcification of the aortic  valve. Aortic valve regurgitation is moderate. Moderate to severe aortic stenosis is  present. Aortic valve mean gradient measures 21.7 mmHg. Aortic valve peak gradient measures 33.2 mmHg. Aortic valve area, by VTI measures 1.17 cm. Pulmonic Valve: The pulmonic valve was normal in structure. Pulmonic valve regurgitation is trivial. Aorta: The aortic root and ascending aorta are structurally normal, with no evidence of dilitation. Venous: The left upper pulmonary vein is abnormal. A pattern of systolic flow reversal, suggestive of severe mitral regurgitation is recorded from the left upper pulmonary vein. IAS/Shunts: No atrial level shunt detected by color flow Doppler. Additional Comments: Spectral Doppler performed. LEFT VENTRICLE PLAX 2D LVOT diam:     2.10 cm LV SV:         80 LV SV Index:   51 LVOT Area:     3.46 cm  AORTIC VALVE AV Area (Vmax):    1.14 cm AV Area (Vmean):   1.12 cm AV Area (VTI):     1.17 cm AV Vmax:           288.00 cm/s AV Vmean:          219.667 cm/s AV VTI:  0.679 m AV Peak Grad:      33.2 mmHg AV Mean Grad:      21.7 mmHg LVOT Vmax:         94.40 cm/s LVOT Vmean:        71.100 cm/s LVOT VTI:          0.230 m LVOT/AV VTI ratio: 0.34  AORTA Ao Asc diam: 3.30 cm MITRAL VALVE                  TRICUSPID VALVE MV Area VTI:  0.90 cm        TR Peak grad:   43.0 mmHg MV Peak grad: 21.6 mmHg       TR Vmax:        328.00 cm/s MV Mean grad: 9.0 mmHg MV Vmax:      2.33 m/s        SHUNTS MV Vmean:     146.0 cm/s      Systemic VTI:  0.23 m MR Peak grad:    147.6 mmHg   Systemic Diam: 2.10 cm MR Mean grad:    101.0 mmHg MR Vmax:         607.50 cm/s MR Vmean:        469.5 cm/s MR PISA:         2.26 cm MR PISA Eff ROA: 15 mm MR PISA Radius:  0.60 cm Arvilla Meres MD Electronically signed by Arvilla Meres MD Signature Date/Time: 04/10/2023/1:20:05 PM    Final    EP STUDY  Result Date: 04/10/2023 See surgical note for result.   Scheduled Meds:  buPROPion  150 mg Oral QPM   cephALEXin  500 mg Oral Q12H   diltiazem  300 mg Oral Daily   escitalopram  10 mg Oral  QPM   folic acid  2 mg Oral QPM   hydroxychloroquine  200 mg Oral QPM   insulin aspart  0-9 Units Subcutaneous TID WC   levothyroxine  175 mcg Oral QPM   metoprolol succinate  25 mg Oral BID   morphine  30 mg Oral Q12H   potassium chloride  40 mEq Oral Once   pravastatin  40 mg Oral QPM   sodium chloride flush  3 mL Intravenous Q12H   spironolactone  25 mg Oral Daily   warfarin  5 mg Oral ONCE-1600   Warfarin - Pharmacist Dosing Inpatient   Does not apply q1600   Continuous Infusions:  heparin 1,000 Units/hr (04/11/23 0341)     LOS: 6 days   Zannie Cove, MD [facility] P12/01/2023, 11:54 AM

## 2023-04-11 NOTE — Progress Notes (Addendum)
Patient ID: [name], female   DOB: 12-26-1950, 72 y.o.   MRN: [id number]     Advanced Heart Failure Rounding Note  PCP-Cardiologist: Sunit Tolia, DO   Subjective:   Overnight had difficulty sleeping. Walking with staff in the hall.    TEE 12/9:  LVEF 65-70%, significant concentric LVH, RV mod reduced, severe BAE AoV: heavily calcified w/ markedly restricted leaflet movement mG 24 mmHg, AVA 1.14cm2, mod AI  MV: immobile posterior leaflet, severe MS mG 9-10 mmHg. Mod-severe MR TR: Mod TR    Objective:   Weight Range: 53.9 kg Body mass index is 21.06 kg/m.   Vital Signs:   Temp:  [97.4 F (36.3 C)-98.3 F (36.8 C)] 97.4 F (36.3 C) (12/09 2257) Pulse Rate:  [54-79] 67 (12/09 2257) Resp:  [10-16] 16 (12/09 2257) BP: (119-158)/(61-78) 119/61 (12/09 2257) SpO2:  [94 %-96 %] 96 % (12/09 2257) Last BM Date : 04/09/23  Weight change: Filed Weights   04/08/23 0433 04/09/23 0337 04/10/23 0447  Weight: 56.6 kg 56 kg 53.9 kg    Intake/Output:   Intake/Output Summary (Last 24 hours) at 04/11/2023 0656 Last data filed at 04/11/2023 0341 Gross per 24 hour  Intake 1134.78 ml  Output 1000 ml  Net 134.78 ml      Physical Exam   General:  Drowsy. No resp difficulty HEENT: normal Neck: supple. no JVD. Carotids 2+ bilat; no bruits. No lymphadenopathy or thryomegaly appreciated. Cor: PMI nondisplaced. Regular rate & rhythm. No rubs, gallops . RUSB 2/6 or murmurs. Lungs: clear Abdomen: soft, nontender, nondistended. No hepatosplenomegaly. No bruits or masses. Good bowel sounds. Extremities: no cyanosis, clubbing, rash, edema Neuro: alert & orientedx3, cranial nerves grossly intact. moves all 4 extremities w/o difficulty. Affect flat    Telemetry   SR 60-70s   Labs    CBC Recent Labs    04/09/23 0842 04/11/23 0218  WBC 11.9* 10.2  HGB 9.5* 9.3*  HCT 32.7* 31.3*  MCV 79.4* 79.0*  PLT 272 249   Basic Metabolic Panel Recent Labs    41/66/06 0222  04/11/23 0218  NA 133* 132*  K 4.2 3.5  CL 91* 88*  CO2 21* 31  GLUCOSE 194* 166*  BUN 51* 40*  CREATININE 1.73* 1.09*  CALCIUM 8.5* 9.0  MG 1.4* 1.8   Liver Function Tests No results for input(s): "AST", "ALT", "ALKPHOS", "BILITOT", "PROT", "ALBUMIN" in the last 72 hours. No results for input(s): "LIPASE", "AMYLASE" in the last 72 hours. Cardiac Enzymes No results for input(s): "CKTOTAL", "CKMB", "CKMBINDEX", "TROPONINI" in the last 72 hours.  BNP: BNP (last 3 results) Recent Labs    04/05/23 1703  BNP 1,498.3*    ProBNP (last 3 results) No results for input(s): "PROBNP" in the last 8760 hours.   D-Dimer No results for input(s): "DDIMER" in the last 72 hours. Hemoglobin A1C No results for input(s): "HGBA1C" in the last 72 hours. Fasting Lipid Panel No results for input(s): "CHOL", "HDL", "LDLCALC", "TRIG", "CHOLHDL", "LDLDIRECT" in the last 72 hours. Thyroid Function Tests No results for input(s): "TSH", "T4TOTAL", "T3FREE", "THYROIDAB" in the last 72 hours.  Invalid input(s): "FREET3"  Other results:   Imaging    ECHO TEE  Result Date: 04/10/2023    TRANSESOPHOGEAL ECHO REPORT   Patient Name:   [name] Date of Exam: 04/10/2023 Medical Rec #:  [id number]        Height:       63.0 in Accession #:    [id number]  Weight:       118.9 lb Date of Birth:  January 07, 1951         BSA:          1.550 m Patient Age:    72 years         BP:           145/67 mmHg Patient Gender: F                HR:           71 bpm. Exam Location:  Inpatient Procedure: Transesophageal Echo, 3D Echo, Cardiac Doppler and Color Doppler Indications:     I35.0 Nonrheumatic aortic (valve) stenosis; I34.2 Nonrheumatic                  mitral (valve) stenosis; I34.0 Nonrheumatic mitral (valve)                  insufficiency  History:         Patient has prior history of Echocardiogram examinations, most                  recent 04/06/2023. Abnormal ECG, Aortic Valve Disease and Mitral                   Valve Disease, Arrythmias:Atrial Fibrillation; Risk                  Factors:Hypertension and Diabetes. Aortic stenosis. Mitral                  stenosis.  Sonographer:     Sheralyn Boatman RDCS Referring Phys:  [id number] [facility] Diagnosing Phys: Arvilla Meres MD PROCEDURE: After discussion of the risks and benefits of a TEE, an informed consent was obtained from the patient. The transesophogeal probe was passed without difficulty through the esophogus of the patient. Imaged were obtained with the patient in a left lateral decubitus position. Local oropharyngeal anesthetic was provided with viscous lidocaine. Sedation performed by different physician. The patient was monitored while under deep sedation. Anesthestetic sedation was provided intravenously by Anesthesiology: 156mg  of Propofol. The patient's vital signs; including heart rate, blood pressure, and oxygen saturation; remained stable throughout the procedure. The patient developed no complications during the procedure.  IMPRESSIONS  1. Left ventricular ejection fraction, by estimation, is 70 to 75%. The left ventricle has hyperdynamic function.  2. Right ventricular systolic function is severely reduced. The right ventricular size is mildly enlarged.  3. Left atrial size was severely dilated. No left atrial/left atrial appendage thrombus was detected.  4. Right atrial size was moderately dilated.  5. The MV and MV annulus are heavily calcified. The posterior leaflet appears immobile. There is severe mitral stenosis. There is moderate to severe MR with prominent systolic flow reversal in the pulmonary veins. MVA 0.9 cm2. Mean gradient 9-10. 3D MV EROA is 0.56. The mitral valve is degenerative. Moderate to severe mitral valve regurgitation. Severe mitral stenosis. The mean mitral valve gradient is 9.0 mmHg.  6. Tricuspid valve regurgitation is moderate.  7. The aortic valve is heavily calcified with nearly immobile leaflets. Mean gradient ranges 20-24. AVA  by VTI was 1.14cm2 with VR 0.33. AVA by planimetry in 0.46cm2. The aortic valve is tricuspid. There is moderate calcification of the aortic valve. Aortic valve regurgitation is moderate. Moderate to severe aortic valve stenosis. Aortic valve area, by VTI measures 1.17 cm. Aortic valve mean gradient measures 21.7 mmHg. Aortic valve Vmax measures 2.88 m/s.  8. The left upper pulmonary vein is abnormal. FINDINGS  Left Ventricle: Left ventricular ejection fraction, by estimation, is 70 to 75%. The left ventricle has hyperdynamic function. The left ventricular internal cavity size was normal in size. Right Ventricle: The right ventricular size is mildly enlarged. No increase in right ventricular wall thickness. Right ventricular systolic function is severely reduced. Left Atrium: Left atrial size was severely dilated. No left atrial/left atrial appendage thrombus was detected. Right Atrium: Right atrial size was moderately dilated. Pericardium: There is no evidence of pericardial effusion. Mitral Valve: The MV and MV annulus are heavily calcified. The posterior leaflet appears immobile. There is severe mitral stenosis. There is moderate to severe MR with prominent systolic flow reversal in the pulmonary veins. MVA 0.9 cm2. Mean gradient 9-10. 3D MV EROA is 0.56. The mitral valve is degenerative in appearance. There is severe calcification of the mitral valve leaflet(s). Severely decreased mobility of the mitral valve leaflets. Moderate to severe mitral valve regurgitation. Severe mitral  valve stenosis. MV peak gradient, 21.6 mmHg. The mean mitral valve gradient is 9.0 mmHg. Tricuspid Valve: The tricuspid valve is grossly normal. Tricuspid valve regurgitation is moderate . No evidence of tricuspid stenosis. Aortic Valve: The aortic valve is heavily calcified with nearly immobile leaflets. Mean gradient ranges 20-24. AVA by VTI was 1.14cm2 with VR 0.33. AVA by planimetry in 0.46cm2. The aortic valve is tricuspid. There  is moderate calcification of the aortic  valve. Aortic valve regurgitation is moderate. Moderate to severe aortic stenosis is present. Aortic valve mean gradient measures 21.7 mmHg. Aortic valve peak gradient measures 33.2 mmHg. Aortic valve area, by VTI measures 1.17 cm. Pulmonic Valve: The pulmonic valve was normal in structure. Pulmonic valve regurgitation is trivial. Aorta: The aortic root and ascending aorta are structurally normal, with no evidence of dilitation. Venous: The left upper pulmonary vein is abnormal. A pattern of systolic flow reversal, suggestive of severe mitral regurgitation is recorded from the left upper pulmonary vein. IAS/Shunts: No atrial level shunt detected by color flow Doppler. Additional Comments: Spectral Doppler performed. LEFT VENTRICLE PLAX 2D LVOT diam:     2.10 cm LV SV:         80 LV SV Index:   51 LVOT Area:     3.46 cm  AORTIC VALVE AV Area (Vmax):    1.14 cm AV Area (Vmean):   1.12 cm AV Area (VTI):     1.17 cm AV Vmax:           288.00 cm/s AV Vmean:          219.667 cm/s AV VTI:            0.679 m AV Peak Grad:      33.2 mmHg AV Mean Grad:      21.7 mmHg LVOT Vmax:         94.40 cm/s LVOT Vmean:        71.100 cm/s LVOT VTI:          0.230 m LVOT/AV VTI ratio: 0.34  AORTA Ao Asc diam: 3.30 cm MITRAL VALVE                  TRICUSPID VALVE MV Area VTI:  0.90 cm        TR Peak grad:   43.0 mmHg MV Peak grad: 21.6 mmHg       TR Vmax:        328.00 cm/s MV Mean grad: 9.0 mmHg MV Vmax:  2.33 m/s        SHUNTS MV Vmean:     146.0 cm/s      Systemic VTI:  0.23 m MR Peak grad:    147.6 mmHg   Systemic Diam: 2.10 cm MR Mean grad:    101.0 mmHg MR Vmax:         607.50 cm/s MR Vmean:        469.5 cm/s MR PISA:         2.26 cm MR PISA Eff ROA: 15 mm MR PISA Radius:  0.60 cm Arvilla Meres MD Electronically signed by Arvilla Meres MD Signature Date/Time: 04/10/2023/1:20:05 PM    Final    EP STUDY  Result Date: 04/10/2023 See surgical note for result.     Medications:     Scheduled Medications:  buPROPion  150 mg Oral QPM   cephALEXin  500 mg Oral Q12H   diltiazem  300 mg Oral Daily   escitalopram  10 mg Oral QPM   folic acid  2 mg Oral QPM   hydroxychloroquine  200 mg Oral QPM   insulin aspart  0-9 Units Subcutaneous TID WC   levothyroxine  175 mcg Oral QPM   metoprolol succinate  25 mg Oral BID   morphine  30 mg Oral Q12H   pravastatin  40 mg Oral QPM   sodium chloride flush  3 mL Intravenous Q12H   spironolactone  12.5 mg Oral Daily    Infusions:  heparin 1,000 Units/hr (04/11/23 0341)    PRN Medications: acetaminophen, hydrocortisone, metoprolol tartrate, ondansetron (ZOFRAN) IV, oxyCODONE-acetaminophen, polyvinyl alcohol, sodium chloride flush     Assessment/Plan   1. Acute HFpEF w/prominent RV dysfunction and valvular heart disease: I reviewed this admission's echo, on my read it showed EF vigorous 65-70% with underfilled LV, LV intracavitary gradient peak 21 mmHg, moderate LVH, grade III DD, interventricular septum flattened in systole and diastole c/w RV pressure and volume overlad, RV mildly reduced systolic function and mildly dilated, RVSP 78 mmHg, severe BAE, moderate MR, severe MS with mean gradient of 21 mmHg, moderate-severe paradoxical low flow low gradient AS with mean gradient of 19 mmHg and AVA 0.78 cm2, mild-moderate AI, moderate TR.  Echo from 4/23 also showed valvular disease.  Admitted w/ volume overload and NYHA class IIIb symptoms at home.  - Volume status stable. Replace Mag - Increase spironolactone to 25 mg daily.   - No SGLT2 inhibitor with UTI.  - Renal function stable.  3. Valvular heart disease: She has severe mitral stenosis with moderate MR and a heavily calcified MV.  There is moderate-severe paradoxical low flow/low gradient AS with mean gradient 19 mmHg, AVA 0.79 cm^2.  She also has moderate TR.  Etiology is possibly rheumatic versus due to prior left-sided radiation for breast cancer in  1990s versus a combination. Her valvular disease is quite severe. TEE 12/9 showed severely calcified AoV w/ restricted leaflet movement and moderate AS mean gradient 24 mmHG w/ AVA 1.14 cm2, mod AI, severe MS w/ mean gradient 9-10 mmHg and restricted posterior leaflet and moderate TR    -Ideally would have MV and aortic valve replacements, tricuspid valve repair, and Maze with LA appendage clipping. CT surgery did not feel like she is a candidate for surgery.  - Dr Gala Romney is discussing with the Structural Team  3. Atrial fibrillation: Paroxysmal.  In setting of severe mitral valve disease (stenosis/regurgitation).  Severe biatrial enlargement on echo/TEE.  Maintaining SR.   - Heparin gtt for now,  was on warfarin at home.  4. RA: On Orencia/Plaquenil  5. Hypothyroidism - on Synthroid 175  6. Type 2DM:  Poorly controlled, Hgb A1c 9.0.  - metformin discontinued for now  - SSI  - No SGLT2 inhibitor with UTI.  7. UTI: Urine culture has grown E coli.   - Cephalexin.   Dr Gala Romney discussing with Structural Team  Length of Stay: 6  Amy Clegg, NP  04/11/2023, 6:56 AM  Advanced Heart Failure Team Pager [phone] (M-F; 7a - 5p)  Please contact [facility] for night-coverage after hours (5p -7a ) and weekends on [email or web address]  Patient seen and examined with the above-signed Advanced Practice Provider and/or Housestaff. I personally reviewed laboratory data, imaging studies and relevant notes. I independently examined the patient and formulated the important aspects of the plan. I have edited the note to reflect any of my changes or salient points. I have personally discussed the plan with the patient and/or family.  TEE yesterday with severe MS/MR and severe AS. Discussed with TCTS. Not felt to be surgical candidate  Denies CP or SOB. Walking the halls some.  Remains in NSR. On heparin  General:  Elderly weak appearing. No resp difficulty HEENT: normal Neck: supple. JVP 8-9 Carotids 2+  bilat; no bruits. No lymphadenopathy or thryomegaly appreciated. Cor: PMI nondisplaced. Regular rate & rhythm. 2/6 AS MR Lungs: decreased Abdomen: soft, nontender, nondistended. No hepatosplenomegaly. No bruits or masses. Good bowel sounds. Extremities: no cyanosis, clubbing, rash, edema Neuro: alert & orientedx3, cranial nerves grossly intact. moves all 4 extremities w/o difficulty. 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 D/w TCTS and not candidate for surgery. Await input from Structural team but doubt TAVR will help and mitral disease seems to be predominant issue. She will need close monitoring of volume status.   Will restart warfarin. Continue to ambulate.   Arvilla Meres, MD  8:03 AM

## 2023-04-11 NOTE — Plan of Care (Signed)
  Problem: Activity: Goal: Risk for activity intolerance will decrease Outcome: Progressing   Problem: Nutrition: Goal: Adequate nutrition will be maintained Outcome: Progressing   Problem: Elimination: Goal: Will not experience complications related to urinary retention Outcome: Progressing   Problem: Safety: Goal: Ability to remain free from injury will improve Outcome: Progressing   Problem: Skin Integrity: Goal: Risk for impaired skin integrity will decrease Outcome: Progressing   

## 2023-04-11 NOTE — Progress Notes (Addendum)
PHARMACY - ANTICOAGULATION CONSULT NOTE  Pharmacy Consult for heparin Indication: atrial fibrillation  Allergies  Allergen Reactions   Fish Allergy Anaphylaxis and Hives   Amaryl [Glimepiride] Itching   Avalide [Irbesartan-Hydrochlorothiazide] Itching   Avandia [Rosiglitazone] Itching   Byetta 10 Mcg Pen [Exenatide] Itching   Codeine Itching    Other reaction(s): Unknown   Demerol [Meperidine Hcl] Itching   Glucovance [Glyburide-Metformin] Itching   Iodinated Contrast Media Itching    Other reaction(s):itching   Lisinopril Itching   Naprosyn [Naproxen] Hives   Penicillins Itching and Other (See Comments)    Has patient had a PCN reaction causing immediate rash, facial/tongue/throat swelling, SOB or lightheadedness with hypotension: no Has patient had a PCN reaction causing severe rash involving mucus membranes or skin necrosis: no Has patient had a PCN reaction that required hospitalization: yes Has patient had a PCN reaction occurring within the last 10 years: no If all of the above answers are "NO", then may proceed with Cephalosporin use.    Patient Measurements: Height: 5\' 3"  (160 cm) Weight: 53.9 kg (118 lb 14.4 oz) IBW/kg (Calculated) : 52.4 Heparin Dosing Weight: 57.1 kg  Vital Signs: Temp: 97.4 F (36.3 C) (12/09 2257) Temp Source: Oral (12/09 2257) BP: 119/61 (12/09 2257) Pulse Rate: 67 (12/09 2257)  Labs: Recent Labs    04/09/23 0842 04/09/23 1903 04/10/23 0222 04/11/23 0218  HGB 9.5*  --   --  9.3*  HCT 32.7*  --   --  31.3*  PLT 272  --   --  249  HEPARINUNFRC 0.61  --  0.26* 0.45  CREATININE 1.09* 2.03* 1.73* 1.09*    Estimated Creatinine Clearance: 38 mL/min (A) (by C-G formula based on SCr of 1.09 mg/dL (H)).   Medical History: Past Medical History:  Diagnosis Date   Asthma, mild    Intermittent   Atrial fibrillation [facility])    Feb 2023   Cancer [facility])    Chronic back pain    Echocardiogram abnormal 09/2008   With mild aortic valve and  mitral valve regurgitation   Heart murmur    History of left breast cancer 1999   Hyperlipidemia    Hypertension    Hypothyroidism    IBS (irritable bowel syndrome)    Lactose intolerance    Osteoporosis    Peptic ulcer disease    rheumatoid Arthritis    Type II diabetes mellitus (HCC)    [facility] 08-2012 checks cbg bid   Ulcer, duodenal, acute, with obstruction 1999   Wears glasses      Assessment: 72 year old female with history of atrial fibrillation on warfarin PTA (severe mitral stenosis). INR subtherapeutic on admission.  Pt is noted with HFpEF and plans for possible valvular procedures. Pharmacy consulted for heparin management.  Heparin level therapeutic at 0.45, CBC stable.   Goal of Therapy:  Heparin level 0.3-0.7 units/ml Monitor platelets by anticoagulation protocol: Yes   Plan:  -Continue heparin infusion at 1000 units/hr -Daily heparin level and CBC  ADDENDUM 1100: no surgeries planned for now, ok to resume warfarin. Home dosing is 2.5mg  daily - will give 5mg  x1 tonight. Heparin as above.   Fredonia Highland, PharmD, BCPS, [facility] Clinical Pharmacist [phone] Please check AMION for all [facility] Pharmacy numbers 04/11/2023

## 2023-04-11 NOTE — Plan of Care (Signed)
  Problem: Education: Goal: Knowledge of General Education information will improve Description: Including pain rating scale, medication(s)/side effects and non-pharmacologic comfort measures Outcome: Progressing   Problem: Health Behavior/Discharge Planning: Goal: Ability to manage health-related needs will improve Outcome: Progressing   Problem: Clinical Measurements: Goal: Ability to maintain clinical measurements within normal limits will improve Outcome: Progressing Goal: Will remain free from infection Outcome: Progressing Goal: Diagnostic test results will improve Outcome: Progressing Goal: Respiratory complications will improve Outcome: Progressing Goal: Cardiovascular complication will be avoided Outcome: Progressing   Problem: Activity: Goal: Risk for activity intolerance will decrease Outcome: Progressing   Problem: Nutrition: Goal: Adequate nutrition will be maintained Outcome: Progressing   Problem: Coping: Goal: Level of anxiety will decrease Outcome: Progressing   Problem: Elimination: Goal: Will not experience complications related to bowel motility Outcome: Progressing Goal: Will not experience complications related to urinary retention Outcome: Progressing   Problem: Pain Management: Goal: General experience of comfort will improve Outcome: Progressing   Problem: Safety: Goal: Ability to remain free from injury will improve Outcome: Progressing   Problem: Skin Integrity: Goal: Risk for impaired skin integrity will decrease Outcome: Progressing   Problem: Education: Goal: Ability to describe self-care measures that may prevent or decrease complications (Diabetes Survival Skills Education) will improve Outcome: Progressing Goal: Individualized Educational Video(s) Outcome: Progressing   Problem: Coping: Goal: Ability to adjust to condition or change in health will improve Outcome: Progressing   Problem: Fluid Volume: Goal: Ability to  maintain a balanced intake and output will improve Outcome: Progressing   Problem: Health Behavior/Discharge Planning: Goal: Ability to identify and utilize available resources and services will improve Outcome: Progressing Goal: Ability to manage health-related needs will improve Outcome: Progressing   Problem: Metabolic: Goal: Ability to maintain appropriate glucose levels will improve Outcome: Progressing   Problem: Nutritional: Goal: Maintenance of adequate nutrition will improve Outcome: Progressing Goal: Progress toward achieving an optimal weight will improve Outcome: Progressing   Problem: Skin Integrity: Goal: Risk for impaired skin integrity will decrease Outcome: Progressing   Problem: Tissue Perfusion: Goal: Adequacy of tissue perfusion will improve Outcome: Progressing   Problem: Education: Goal: Ability to demonstrate management of disease process will improve Outcome: Progressing Goal: Ability to verbalize understanding of medication therapies will improve Outcome: Progressing Goal: Individualized Educational Video(s) Outcome: Progressing   Problem: Activity: Goal: Capacity to carry out activities will improve Outcome: Progressing   Problem: Cardiac: Goal: Ability to achieve and maintain adequate cardiopulmonary perfusion will improve Outcome: Progressing

## 2023-04-12 DIAGNOSIS — Z7189 Other specified counseling: Secondary | ICD-10-CM

## 2023-04-12 DIAGNOSIS — I5031 Acute diastolic (congestive) heart failure: Secondary | ICD-10-CM | POA: Diagnosis not present

## 2023-04-12 DIAGNOSIS — M069 Rheumatoid arthritis, unspecified: Secondary | ICD-10-CM | POA: Diagnosis not present

## 2023-04-12 DIAGNOSIS — R579 Shock, unspecified: Secondary | ICD-10-CM

## 2023-04-12 DIAGNOSIS — I10 Essential (primary) hypertension: Secondary | ICD-10-CM | POA: Diagnosis not present

## 2023-04-12 DIAGNOSIS — I509 Heart failure, unspecified: Secondary | ICD-10-CM

## 2023-04-12 DIAGNOSIS — I480 Paroxysmal atrial fibrillation: Secondary | ICD-10-CM | POA: Diagnosis not present

## 2023-04-12 DIAGNOSIS — I059 Rheumatic mitral valve disease, unspecified: Secondary | ICD-10-CM | POA: Diagnosis not present

## 2023-04-12 DIAGNOSIS — Z515 Encounter for palliative care: Secondary | ICD-10-CM | POA: Diagnosis not present

## 2023-04-12 DIAGNOSIS — I5033 Acute on chronic diastolic (congestive) heart failure: Secondary | ICD-10-CM | POA: Diagnosis not present

## 2023-04-12 LAB — MAGNESIUM: Magnesium: 1.8 mg/dL (ref 1.7–2.4)

## 2023-04-12 LAB — CBC
HCT: 32.1 % — ABNORMAL LOW (ref 36.0–46.0)
Hemoglobin: 9.1 g/dL — ABNORMAL LOW (ref 12.0–15.0)
MCH: 23.3 pg — ABNORMAL LOW (ref 26.0–34.0)
MCHC: 28.3 g/dL — ABNORMAL LOW (ref 30.0–36.0)
MCV: 82.3 fL (ref 80.0–100.0)
Platelets: 138 10*3/uL — ABNORMAL LOW (ref 150–400)
RBC: 3.9 MIL/uL (ref 3.87–5.11)
RDW: 17.8 % — ABNORMAL HIGH (ref 11.5–15.5)
WBC: 10.4 10*3/uL (ref 4.0–10.5)
nRBC: 0 % (ref 0.0–0.2)

## 2023-04-12 LAB — GLUCOSE, CAPILLARY
Glucose-Capillary: 161 mg/dL — ABNORMAL HIGH (ref 70–99)
Glucose-Capillary: 201 mg/dL — ABNORMAL HIGH (ref 70–99)
Glucose-Capillary: 174 mg/dL — ABNORMAL HIGH (ref 70–99)
Glucose-Capillary: 122 mg/dL — ABNORMAL HIGH (ref 70–99)

## 2023-04-12 LAB — BASIC METABOLIC PANEL
Anion gap: 19 — ABNORMAL HIGH (ref 5–15)
BUN: 51 mg/dL — ABNORMAL HIGH (ref 8–23)
CO2: 21 mmol/L — ABNORMAL LOW (ref 22–32)
Calcium: 9 mg/dL (ref 8.9–10.3)
Chloride: 91 mmol/L — ABNORMAL LOW (ref 98–111)
Creatinine, Ser: 1.68 mg/dL — ABNORMAL HIGH (ref 0.44–1.00)
GFR, Estimated: 32 mL/min — ABNORMAL LOW (ref >60)
Glucose, Bld: 169 mg/dL — ABNORMAL HIGH (ref 70–99)
Potassium: 4.8 mmol/L (ref 3.5–5.1)
Sodium: 131 mmol/L — ABNORMAL LOW (ref 135–145)

## 2023-04-12 LAB — HEPARIN LEVEL (UNFRACTIONATED): Heparin Unfractionated: 0.4 [IU]/mL (ref 0.30–0.70)

## 2023-04-12 LAB — PROTIME-INR
INR: 1.6 — ABNORMAL HIGH (ref 0.8–1.2)
Prothrombin Time: 18.9 s — ABNORMAL HIGH (ref 11.4–15.2)

## 2023-04-12 LAB — LACTIC ACID, PLASMA: Lactic Acid, Venous: 6.9 mmol/L (ref 0.5–1.9)

## 2023-04-12 MED ORDER — HYDROMORPHONE HCL 1 MG/ML IJ SOLN
0.5000 mg | INTRAMUSCULAR | Status: DC | PRN
  Administered 2023-04-14: 0.5 mg via INTRAVENOUS
  Filled 2023-04-12: qty 0.5

## 2023-04-12 MED ORDER — METOPROLOL SUCCINATE ER 25 MG PO TB24
12.5000 mg | ORAL_TABLET | Freq: Every day | ORAL | Status: DC
  Administered 2023-04-12: 12.5 mg via ORAL
  Filled 2023-04-12: qty 1

## 2023-04-12 MED ORDER — FUROSEMIDE 10 MG/ML IJ SOLN
60.0000 mg | Freq: Two times a day (BID) | INTRAMUSCULAR | Status: DC
  Administered 2023-04-12: 60 mg via INTRAVENOUS
  Filled 2023-04-12 (×2): qty 6

## 2023-04-12 MED ORDER — MAGNESIUM SULFATE 2 GM/50ML IV SOLN
2.0000 g | Freq: Once | INTRAVENOUS | Status: AC
  Administered 2023-04-12: 2 g via INTRAVENOUS
  Filled 2023-04-12: qty 50

## 2023-04-12 MED ORDER — WARFARIN SODIUM 2.5 MG PO TABS
2.5000 mg | ORAL_TABLET | Freq: Once | ORAL | Status: AC
  Administered 2023-04-12: 2.5 mg via ORAL
  Filled 2023-04-12: qty 1

## 2023-04-12 MED ORDER — FUROSEMIDE 10 MG/ML IJ SOLN
60.0000 mg | Freq: Once | INTRAMUSCULAR | Status: AC
  Administered 2023-04-12: 60 mg via INTRAVENOUS
  Filled 2023-04-12: qty 6

## 2023-04-12 MED ORDER — BISACODYL 5 MG PO TBEC
5.0000 mg | DELAYED_RELEASE_TABLET | Freq: Once | ORAL | Status: AC
  Administered 2023-04-12: 5 mg via ORAL
  Filled 2023-04-12: qty 1

## 2023-04-12 NOTE — Assessment & Plan Note (Signed)
Continue rate control with diltiazem, hold on PRN metoprolol.  Continue telemetry monitoring  Patient on IV heparin for anticoagulation, possible transition to DOAC if no further intervention.

## 2023-04-12 NOTE — Assessment & Plan Note (Signed)
Discontinue hydroxychloroquine

## 2023-04-12 NOTE — TOC Progression Note (Signed)
Transition of Care [facility]) - Progression Note    Patient Details  Name: [name] MRN: [id number] Date of Birth: [birth year]  Transition of Care [facility] CM/SW Contact  Nicanor Bake Phone Number: [phone] 04/12/2023, 3:01 PM  Clinical Narrative:  HF CSW spoke with pts son, Tawanna Cooler over the phone. Tawanna Cooler stated that his aunt received a phone call requesting for the family to be present due to what looks like unknown circumstances. Tawanna Cooler stated that he was in the room with his wife, brother left for a second but coming back shrotly, and his aunt (pts sister) was on the way. CSW provided the family with contact information if additional resources are needed.   TOC will continue following.      Expected Discharge Plan: Home/Self Care Barriers to Discharge: Continued Medical Work up  Expected Discharge Plan and Services       Living arrangements for the past 2 months: Mobile Home                                       Social Determinants of Health (SDOH) Interventions SDOH Screenings   Food Insecurity: No Food Insecurity (04/05/2023)  Housing: Low Risk  (04/05/2023)  Transportation Needs: No Transportation Needs (04/05/2023)  Utilities: Not At Risk (04/05/2023)  Tobacco Use: Medium Risk (04/05/2023)    Readmission Risk Interventions     No data to display

## 2023-04-12 NOTE — Assessment & Plan Note (Addendum)
Continue with as needed oxycodone.  Bid morphine 30 mg q12 hrs  Continue with bupropion.   Pressure ulcer  , ,  Active Pressure Injury/Wound(s)     Pressure Ulcer  Duration          Pressure Injury 04/05/23 Buttocks Right;Left Stage 2 -  Partial thickness loss of dermis presenting as a shallow open injury with a red, pink wound bed without slough. 7 days

## 2023-04-12 NOTE — Progress Notes (Signed)
Patient has been resting quietly in room.  Woke up to use BSC.  States she doesn't feel good but unable to give more information when asked.  Patient up to [facility], then started dry heaving.  No emesis noted.  Patient VSS.  Placed back to bed.

## 2023-04-12 NOTE — Hospital Course (Addendum)
[name] was admitted to the hospital with the working diagnosis of heart failure exacerbation.   72/F w/RA, breast cancer status postlumpectomy and chemoradiation with subsequent left mastectomy, paroxysmal A-fib on Coumadin, moderate MR/severe MS, diabetes type 2, hypertension, hyperlipidemia, hypothyroidism who presented with 1 to 2 weeks history of progressive exertional dyspnea, dizziness.  At home she had worsening dyspnea to the point she was having symptoms with minimal efforts. Positive lower extremity edema, PND and orthopnea.  On her initial physical examination her heart rate was 120, blood pressure 136/86, RR 21 and 02 saturation 93%, lungs with no wheezing, but bilateral rales, heart with S1 and S2 present with positive diastolic murmur a the apex, abdomen with no distention and positive lower extremity edema.   Na 139, K 3,0 Cl 99, bicarbonate 27 glucose 207 bun 20 cr 0,84 Mag 1.2  AST 22 ALT 18  BNP 1,498  High sensitive troponin 71 and 67  Wbc 12.5 hgb 9,9 plt 330   Urine analysis SG 1,018, protein > 300, many bacteria, large leukocytes, wbc 21-50, small hgb, 11-20 rbc,  Urine culture positive for E coli 100, 000 CFU  Chest radiograph with left rotation, cardiomegaly, bilateral hilar vascular congestion, noted enlarged LA. No effusions.   EKG 127 bpm, right axis deviation, normal intervals, sinus rhythm with bi atrial enlargement, with no significant ST segment or T wave changes.   Echocardiogram showed severe mitral stenosis, mod MR, Mod to Severe AS.   -12/9: TEE: Confirmed severe mitral stenosis and moderate to severe aortic stenosis, CHF team discussed with CT surgery, not a candidate for surgical valve repair 12/12 patient developed signs of cardiogenic shock with high lactate and encephalopathy.  Poor prognosis, her family decided to continue care under comfort care.  She will need residential hospice or SNF with hospice care. At this point in time can not return  home.  12/13 patient will be transferred to residential hospice to continue her care.

## 2023-04-12 NOTE — Progress Notes (Signed)
PHARMACY - ANTICOAGULATION CONSULT NOTE  Pharmacy Consult for heparin Indication: atrial fibrillation  Allergies  Allergen Reactions   Fish Allergy Anaphylaxis and Hives   Amaryl [Glimepiride] Itching   Avalide [Irbesartan-Hydrochlorothiazide] Itching   Avandia [Rosiglitazone] Itching   Byetta 10 Mcg Pen [Exenatide] Itching   Codeine Itching    Other reaction(s): Unknown   Demerol [Meperidine Hcl] Itching   Glucovance [Glyburide-Metformin] Itching   Iodinated Contrast Media Itching    Other reaction(s):itching   Lisinopril Itching   Naprosyn [Naproxen] Hives   Penicillins Itching and Other (See Comments)    Has patient had a PCN reaction causing immediate rash, facial/tongue/throat swelling, SOB or lightheadedness with hypotension: no Has patient had a PCN reaction causing severe rash involving mucus membranes or skin necrosis: no Has patient had a PCN reaction that required hospitalization: yes Has patient had a PCN reaction occurring within the last 10 years: no If all of the above answers are "NO", then may proceed with Cephalosporin use.    Patient Measurements: Height: 5\' 3"  (160 cm) Weight: 54.4 kg (119 lb 14.9 oz) IBW/kg (Calculated) : 52.4 Heparin Dosing Weight: 57.1 kg  Vital Signs: Temp: 97.6 F (36.4 C) (12/11 0321) Temp Source: Oral (12/11 0321) BP: 131/73 (12/11 0918) Pulse Rate: 69 (12/11 0918)  Labs: Recent Labs    04/10/23 0222 04/11/23 0218 04/12/23 0655  HGB  --  9.3* 9.1*  HCT  --  31.3* 32.1*  PLT  --  249 138*  LABPROT  --   --  18.9*  INR  --   --  1.6*  HEPARINUNFRC 0.26* 0.45 0.40  CREATININE 1.73* 1.09* 1.68*    Estimated Creatinine Clearance: 24.7 mL/min (A) (by C-G formula based on SCr of 1.68 mg/dL (H)).   Medical History: Past Medical History:  Diagnosis Date   Asthma, mild    Intermittent   Atrial fibrillation [facility])    Feb 2023   Cancer [facility])    Chronic back pain    Echocardiogram abnormal 09/2008   With mild aortic  valve and mitral valve regurgitation   Heart murmur    History of left breast cancer 1999   Hyperlipidemia    Hypertension    Hypothyroidism    IBS (irritable bowel syndrome)    Lactose intolerance    Osteoporosis    Peptic ulcer disease    rheumatoid Arthritis    Type II diabetes mellitus (HCC)    [facility] 08-2012 checks cbg bid   Ulcer, duodenal, acute, with obstruction 1999   Wears glasses      Assessment: 72 year old female with history of atrial fibrillation on warfarin PTA (severe mitral stenosis). INR subtherapeutic on admission.  Pt is noted with HFpEF and plans for possible valvular procedures. Pharmacy consulted for heparin management. Warfarin resumed 12/10 (home dose 2.5mg  daily).  INR 1.6, heparin level therapeutic at 0.4, CBC stable.   Goal of Therapy:  Heparin level 0.3-0.7 units/ml Monitor platelets by anticoagulation protocol: Yes   Plan:  -Continue heparin infusion at 1000 units/hr -Warfarin 2.5mg  x1 tonight -Daily heparin level and CBC and INR    Fredonia Highland, PharmD, BCPS, [facility] Clinical Pharmacist [phone] Please check AMION for all [facility] Pharmacy numbers 04/12/2023

## 2023-04-12 NOTE — Plan of Care (Signed)
  Problem: Education: Goal: Knowledge of General Education information will improve Description: Including pain rating scale, medication(s)/side effects and non-pharmacologic comfort measures Outcome: Progressing   Problem: Health Behavior/Discharge Planning: Goal: Ability to manage health-related needs will improve Outcome: Progressing   Problem: Clinical Measurements: Goal: Ability to maintain clinical measurements within normal limits will improve Outcome: Progressing Goal: Will remain free from infection Outcome: Progressing Goal: Diagnostic test results will improve Outcome: Progressing Goal: Respiratory complications will improve Outcome: Progressing Goal: Cardiovascular complication will be avoided Outcome: Progressing   Problem: Activity: Goal: Risk for activity intolerance will decrease Outcome: Progressing   Problem: Nutrition: Goal: Adequate nutrition will be maintained Outcome: Progressing   Problem: Coping: Goal: Level of anxiety will decrease Outcome: Progressing   Problem: Elimination: Goal: Will not experience complications related to bowel motility Outcome: Progressing Goal: Will not experience complications related to urinary retention Outcome: Progressing   Problem: Pain Management: Goal: General experience of comfort will improve Outcome: Progressing   Problem: Safety: Goal: Ability to remain free from injury will improve Outcome: Progressing   Problem: Skin Integrity: Goal: Risk for impaired skin integrity will decrease Outcome: Progressing   Problem: Education: Goal: Ability to describe self-care measures that may prevent or decrease complications (Diabetes Survival Skills Education) will improve Outcome: Progressing Goal: Individualized Educational Video(s) Outcome: Progressing   Problem: Coping: Goal: Ability to adjust to condition or change in health will improve Outcome: Progressing   Problem: Fluid Volume: Goal: Ability to  maintain a balanced intake and output will improve Outcome: Progressing   Problem: Health Behavior/Discharge Planning: Goal: Ability to identify and utilize available resources and services will improve Outcome: Progressing Goal: Ability to manage health-related needs will improve Outcome: Progressing   Problem: Metabolic: Goal: Ability to maintain appropriate glucose levels will improve Outcome: Progressing   Problem: Nutritional: Goal: Maintenance of adequate nutrition will improve Outcome: Progressing Goal: Progress toward achieving an optimal weight will improve Outcome: Progressing   Problem: Skin Integrity: Goal: Risk for impaired skin integrity will decrease Outcome: Progressing   Problem: Tissue Perfusion: Goal: Adequacy of tissue perfusion will improve Outcome: Progressing   Problem: Education: Goal: Ability to demonstrate management of disease process will improve Outcome: Progressing Goal: Ability to verbalize understanding of medication therapies will improve Outcome: Progressing Goal: Individualized Educational Video(s) Outcome: Progressing   Problem: Activity: Goal: Capacity to carry out activities will improve Outcome: Progressing   Problem: Cardiac: Goal: Ability to achieve and maintain adequate cardiopulmonary perfusion will improve Outcome: Progressing

## 2023-04-12 NOTE — Progress Notes (Signed)
Patient nauseated, vomited x1 approximately 200 cc undigested food.  Patient reports not feeling well.  VSS.  Patient back to bed.  Patient went back to sleep after zofran given.

## 2023-04-12 NOTE — Progress Notes (Addendum)
Patient ID: [name], female   DOB: Jan 16, 1951, 72 y.o.   MRN: [id number]     Advanced Heart Failure Rounding Note  PCP-Cardiologist: Sunit Tolia, DO   Subjective:    Overnight having nausea with emesis. Bradycardic in the 40-50s. Weight down 3lbs from yesterday. Although remains higher than admission weight. inaccurate I/Os in chart.  By nurse report slept well overnight. Nausea continues this am. Appears delirious on exam, but answering orientation questions and following commands appropriately. No SOB. No CP. Lactic Acid ordered STAT.   TEE 12/9:  LVEF 65-70%, significant concentric LVH, RV mod reduced, severe BAE AoV: heavily calcified w/ markedly restricted leaflet movement mG 24 mmHg, AVA 1.14cm2, mod AI  MV: immobile posterior leaflet, severe MS mG 9-10 mmHg. Mod-severe MR TR: Mod TR   Objective:   Weight Range: 54.4 kg Body mass index is 21.24 kg/m.   Vital Signs:   Temp:  [97.4 F (36.3 C)-98 F (36.7 C)] 97.6 F (36.4 C) (12/11 0321) Pulse Rate:  [43-68] 54 (12/11 0321) Resp:  [11-20] 14 (12/11 0321) BP: (102-152)/(55-90) 114/66 (12/11 0321) SpO2:  [95 %-100 %] 100 % (12/11 0321) Weight:  [54.4 kg-55.8 kg] 54.4 kg (12/11 0324) Last BM Date : 04/09/23  Weight change: Filed Weights   04/10/23 0447 04/11/23 1039 04/12/23 0324  Weight: 53.9 kg 55.8 kg 54.4 kg    Intake/Output:   Intake/Output Summary (Last 24 hours) at 04/12/2023 0735 Last data filed at 04/12/2023 0535 Gross per 24 hour  Intake 257.63 ml  Output 1100 ml  Net -842.37 ml    Physical Exam   General: Frail and pale appearing. Sitting up in bed naked on exam. No resp distress on RA HEENT: neck supple.   Cardiac: JVP ~10cm. S1 and S2 present. Aortic murmur present on exam Resp: Lung sounds clear and equal B/L Abdomen: Soft, non-tender, non-distended. + BS. Extremities: Warm and dry. No rash, cyanosis.  2+ edema BLE Neuro: Alert and oriented x3. Appears delirious. Affect pleasant. Moves  all extremities without difficulty.  Telemetry   SR 40s overnight. Now 60s (personally reviewed).  Labs    CBC Recent Labs    04/11/23 0218 04/12/23 0655  WBC 10.2 10.4  HGB 9.3* 9.1*  HCT 31.3* 32.1*  MCV 79.0* 82.3  PLT 249 138*   Basic Metabolic Panel Recent Labs    78/29/56 0222 04/11/23 0218  NA 133* 132*  K 4.2 3.5  CL 91* 88*  CO2 21* 31  GLUCOSE 194* 166*  BUN 51* 40*  CREATININE 1.73* 1.09*  CALCIUM 8.5* 9.0  MG 1.4* 1.8   Liver Function Tests No results for input(s): "AST", "ALT", "ALKPHOS", "BILITOT", "PROT", "ALBUMIN" in the last 72 hours. No results for input(s): "LIPASE", "AMYLASE" in the last 72 hours. Cardiac Enzymes No results for input(s): "CKTOTAL", "CKMB", "CKMBINDEX", "TROPONINI" in the last 72 hours.  BNP: BNP (last 3 results) Recent Labs    04/05/23 1703  BNP 1,498.3*   ProBNP (last 3 results) No results for input(s): "PROBNP" in the last 8760 hours.  D-Dimer No results for input(s): "DDIMER" in the last 72 hours. Hemoglobin A1C No results for input(s): "HGBA1C" in the last 72 hours. Fasting Lipid Panel No results for input(s): "CHOL", "HDL", "LDLCALC", "TRIG", "CHOLHDL", "LDLDIRECT" in the last 72 hours. Thyroid Function Tests No results for input(s): "TSH", "T4TOTAL", "T3FREE", "THYROIDAB" in the last 72 hours.  Invalid input(s): "FREET3"  Other results:  Imaging   No results found.  Medications:  Scheduled Medications:  buPROPion  150 mg Oral QPM   cephALEXin  500 mg Oral Q12H   diltiazem  300 mg Oral Daily   escitalopram  10 mg Oral QPM   folic acid  2 mg Oral QPM   furosemide  60 mg Intravenous Once   hydroxychloroquine  200 mg Oral QPM   insulin aspart  0-9 Units Subcutaneous TID WC   insulin glargine-yfgn  15 Units Subcutaneous Daily   levothyroxine  175 mcg Oral QPM   metoprolol succinate  12.5 mg Oral Daily   morphine  30 mg Oral Q12H   pravastatin  40 mg Oral QPM   sodium chloride flush  3 mL  Intravenous Q12H   spironolactone  25 mg Oral Daily   Warfarin - Pharmacist Dosing Inpatient   Does not apply q1600    Infusions:  heparin 1,000 Units/hr (04/12/23 0535)    PRN Medications: acetaminophen, alum & mag hydroxide-simeth, hydrocortisone, metoprolol tartrate, ondansetron (ZOFRAN) IV, oxyCODONE-acetaminophen, polyvinyl alcohol, sodium chloride flush  Assessment/Plan   1. Acute HFpEF w/prominent RV dysfunction and valvular heart disease: I reviewed this admission's echo, on my read it showed EF vigorous 65-70% with underfilled LV, LV intracavitary gradient peak 21 mmHg, moderate LVH, grade III DD, interventricular septum flattened in systole and diastole c/w RV pressure and volume overlad, RV mildly reduced systolic function and mildly dilated, RVSP 78 mmHg, severe BAE, moderate MR, severe MS with mean gradient of 21 mmHg, moderate-severe paradoxical low flow low gradient AS with mean gradient of 19 mmHg and AVA 0.78 cm2, mild-moderate AI, moderate TR.  Echo from 4/23 also showed valvular disease.  Admitted w/ volume overload and NYHA class IIIb symptoms at home.  - Exam today concerning for low output. LA pending. Need GOC/pallative discussion with patient and family.  - Volume up on exam. Lasix 60 IV today.  - Continue spironolactone to 25 mg daily.   - Bradycardic to low 40s overnight. On Toprol 25 bid, RN staff has been holding PM dose for bradycardia x2. Decrease to 12.5mg  daily. - No SGLT2 inhibitor with UTI.  - Lactic acid pending - Renal function up today 1.09>1.68. 3. Valvular heart disease: She has severe mitral stenosis with moderate MR and a heavily calcified MV.  There is moderate-severe paradoxical low flow/low gradient AS with mean gradient 19 mmHg, AVA 0.79 cm^2.  She also has moderate TR.  Etiology is possibly rheumatic versus due to prior left-sided radiation for breast cancer in 1990s versus a combination. Her valvular disease is quite severe. TEE 12/9 showed  severely calcified AoV w/ restricted leaflet movement and moderate AS mean gradient 24 mmHG w/ AVA 1.14 cm2, mod AI, severe MS w/ mean gradient 9-10 mmHg and restricted posterior leaflet and moderate TR    - Ideally would have MV and aortic valve replacements, tricuspid valve repair, and Maze with LA appendage clipping. CT surgery did not feel like she is a candidate for surgery.  - Dr Gala Romney discussed with the Structural Team. Not candidate for TAVR. 3. Atrial fibrillation: Paroxysmal.  In setting of severe mitral valve disease (stenosis/regurgitation).  Severe biatrial enlargement on echo/TEE.  Maintaining SR.   - Back on warfarin 4. RA: On Orencia/Plaquenil  5. Hypothyroidism - on Synthroid 175  6. Type 2DM:  Poorly controlled, Hgb A1c 9.0.  - metformin discontinued for now  - SSI  - No SGLT2 inhibitor with UTI.  7. UTI: Urine culture has grown E coli.   - Cephalexin.   DNR.  Given that she is not a surgical candidate, will need GOCPalliative discussion.   Length of Stay: 7  [place] Lee, NP  04/12/2023, 7:35 AM  Advanced Heart Failure Team Pager [phone] (M-F; 7a - 5p)  Please contact [facility] for night-coverage after hours (5p -7a ) and weekends on [email or web address]  Agree with above,   Case d/w Structural team in [place]. Not candidate for valve in MAC +/- TAVR. Decision to proceed with medical therapy/Palliative Care  This morning much worse. Having ab pian. Weak. Lactic acid is 7  General:  Sitting up on side of bed. Ill appearing. No resp difficulty HEENT: normal Neck: supple. JVP 7-8. Carotids 2+ bilat; no bruits. No lymphadenopathy or thryomegaly appreciated. Cor: PMI nondisplaced. Regular rate & rhythm. No rubs, gallops or murmurs. Lungs: clear Abdomen: soft, nontender, nondistended. No hepatosplenomegaly. No bruits or masses. Good bowel sounds. Extremities: no cyanosis, clubbing, rash, 1+ ankle edema  2/6 AS 2/6 MR Neuro: alert & orientedx3, cranial nerves grossly  intact. moves all 4 extremities w/o difficulty. Affect pleasant  She is critically ill with shock/mesenteric ischemia. Lactic acid quite high.   After discussions with surgical and structural heart teams there are no options for her valvular disease.   I called her sister to discuss. Will get stat Palliative Care consult. Suspect she will continue to deteriorate throughout the day.   CRITICAL CARE Performed by: Arvilla Meres  Total critical care time: 50 minutes  Critical care time was exclusive of separately billable procedures and treating other patients.  Critical care was necessary to treat or prevent imminent or life-threatening deterioration.  Critical care was time spent personally by me (independent of midlevel providers or residents) on the following activities: development of treatment plan with patient and/or surrogate as well as nursing, discussions with consultants, evaluation of patient's response to treatment, examination of patient, obtaining history from patient or surrogate, ordering and performing treatments and interventions, ordering and review of laboratory studies, ordering and review of radiographic studies, pulse oximetry and re-evaluation of patient's condition.  Arvilla Meres, MD  11:22 AM

## 2023-04-12 NOTE — Progress Notes (Signed)
Patient slept throughout night, she is lethargic but arousable.  Remains disoriented to situation.

## 2023-04-12 NOTE — Progress Notes (Signed)
   Spoke with patient and her sister this evening.  Patient sitting up eating dinner. Denies pain or SOB.   Seems mildly confused.  Have met with Palliative Care. Plan is to transition to comfort care with possible transfer to [facility].  The HF team will follow at a distance. Please call with questions.   Arvilla Meres, MD  12:00 AM

## 2023-04-12 NOTE — Plan of Care (Signed)
  Problem: Nutrition: Goal: Adequate nutrition will be maintained Outcome: Progressing   

## 2023-04-12 NOTE — Progress Notes (Signed)
Progress Note   Patient: [name] MWN:[id number] DOB: [birth year] DOA: 04/05/2023     7 DOS: the patient was seen and examined on 04/12/2023   Brief hospital course: [name] was admitted to the hospital with the working diagnosis of heart failure exacerbation.   72/F w/RA, breast cancer status postlumpectomy and chemoradiation with subsequent left mastectomy, paroxysmal A-fib on Coumadin, moderate MR/severe MS, diabetes type 2, hypertension, hyperlipidemia, hypothyroidism who presented with 1 to 2 weeks history of progressive exertional dyspnea, dizziness.  -On admission noted to be volume overloaded echo showed severe mitral stenosis, mod MR, Mod to Severe AS.  Cardiology consulted.   -diuresed,  -12/9: TEE: Confirmed severe mitral stenosis and moderate to severe aortic stenosis, CHF team discussed with CT surgery, not a candidate for surgical valve repair  Assessment and Plan: * Acute on chronic diastolic (congestive) heart failure (HCC) Echocardiogram with preserved LV systolic function with  EF 55 to 60%, severe LVH, interventricular septum is flattened in systole and diastole, RVSP 78 mmHg, mild reduction in RV systolic function, RV with moderate dilatation, mild increased RV wall thickness, LA and RA with severe dilatation, moderate mitral regurgitation with severe mitral stenosis, moderate TR, moderate to severe aortic valve stenosis, mild to moderate aortic regurgitation.   Rheumatic valvular heart disease.  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   Urine output is 1,100 ml Systolic blood pressure 114 mmHg.  Lactic acid today is 6,9  Continue have edema, and this am having somnolence.   Likely patient developing cardiogenic shock.  Limited options due to valvular heart disease, not candidate to invasive interventions.  Plan to continue diuresis with furosemide, she had 60 mg IV today, likely will need to continue at least bid dosing.   Continue spironolactone Follow up with palliative care.     Atrial fibrillation (HCC) Continue rate control with diltiazem, hold on PRN metoprolol.  Continue telemetry monitoring  Patient on IV heparin for anticoagulation, possible transition to DOAC if no further intervention.   Type 2 diabetes mellitus with hyperlipidemia (HCC) Continue glucose cover and monitoring with insulin sliding scale.  Basal insulin 15 units.   Continue with statin therapy.   HTN (hypertension) Continue blood pressure monitoring.   Chronic pain syndrome Continue with as needed oxycodone.  Bid morphine 30 mg q12 hrs   Rheumatoid arthritis (HCC) Continue with hydroxychloroquine.         Subjective: Patient is somnolent this am, responds to voice, no chest pain,. Positive constipation   Physical Exam: Vitals:   04/12/23 0321 04/12/23 0324 04/12/23 0818 04/12/23 0918  BP: 114/66  131/73 131/73  Pulse: (!) 54  61 69  Resp: 14  17   Temp: 97.6 F (36.4 C)     TempSrc: Oral     SpO2: 100%     Weight:  54.4 kg    Height:       Neurology somnolent but easy to arouse  ENT with mild pallor Cardiovascular with S1 and S2 present and regular, positive diastolic murmur at the apex with systolic murmur at the base No JVD Positive lower extremity edema ++, pitting, cold lower extremities Respiratory with rales at bases with no wheezing or rhonchi Abdomen with no disteantion   Data Reviewed:    Family Communication: no family at the bedside   Disposition: Status is: Inpatient Remains inpatient appropriate because: worsening heart failure   Planned Discharge Destination: Home      Author: Coralie Keens, MD  04/12/2023 11:15 AM  For on call review [email or web address].

## 2023-04-12 NOTE — Consult Note (Signed)
Palliative Care Consult Note                                  Date: 04/12/2023   Patient Name: VERNETTA ROEHRIG  DOB: [birth year]  MRN: [id number]  Age / Sex: 72 y.o., female  PCP: Noberto Retort, MD Referring Physician: Coralie Keens  Reason for Consultation: Establishing goals of care  HPI/Patient Profile: 72 y.o. female  with past medical history of rheumatoid arthritis, paroxysmal A-fib on Coumadin, moderate MR/severe MS, diabetes type 2, hypertension, hyperlipidemia, hypothyroidism, and breast cancer s/p left mastectomy and chemoradiation.  She presented to the ED on 04/05/2023 with 1-2 week history of progressive exertional dyspnea and dizziness.  On admission she was noted to be volume overloaded.  Echocardiogram showed severe mitral stenosis, moderate MR, and moderate to severe AS.  Palliative medicine has been consulted for goals of care and medical decision making   Subjective:   I have reviewed medical records including EPIC notes, labs and imaging.  Received an update from the RN; he has just received a call about a critical lactic acid of 6.9.   Assessed the patient at bedside.  She is lethargic and not able to participate in GOC discussion at this time.   I spoke with son/Chad by phone. Later (around 1600), I was able to meet with other son/Todd and sister/Kathy in person on 2C to discuss diagnosis, prognosis, GOC.  I introduced Palliative Medicine as specialized medical care for people living with serious illness. It focuses on providing relief from the symptoms and stress of a serious illness.   Created space and opportunity for family to express thoughts and feelings regarding current medical situation. Values and goals of care were attempted to be elicited.  Social History: Family reports that patient has had a "hard" life.  For many years, she has struggled with hoarding.  Family shares that patient's  home/living condition was quite poor, and that she was resistant to their attempts to help.   Functional Status: Prior to admission, patient was independent, lived alone, and was driving.  She has assistance as needed from her sons and daughter-in-law.  Today's Discussion:  We discussed patient's current illness and what it means in the larger context of her ongoing co-morbidities. Current clinical status was reviewed. Discussed patient has severe valvular disease and is not a candidate for surgical intervention.   Discussed the limitations of medical interventions to prolong quality of life when the body fails to thrive in the setting of advanced illness.  I shared my concern that patient is approaching end of life. Natural trajectory at EOL discussed.  Encouraged family to consider transition to comfort care, which would mean de-escalating full scope medical interventions and allowing a natural course to occur. Discussed that the goal is comfort and dignity rather than cure/prolonging life. Discussed transitioning to comfort care and what that would look like--keeping her clean and dry, no labs, no artificial hydration or feeding, no antibiotics, minimizing of medications, and medication for pain and other symptoms as needed.   Family verbalizes understanding and agreement with comfort care, but want to continue current supportive care until patient's brother can arrive tomorrow.  Plan for transition to full comfort care at that time.  In the meantime, they want to ensure patient can have pain medication as needed.    Review of Systems  Unable to perform ROS   Objective:  Primary Diagnoses: Present on Admission:  Acute on chronic diastolic (congestive) heart failure (HCC)  Atrial fibrillation (HCC)  Chronic narcotic use  Chronic pain syndrome  HTN (hypertension)  Rheumatoid arthritis (HCC)  Physical Exam Vitals reviewed.  Constitutional:      General: She is not in acute  distress.    Appearance: She is ill-appearing.  Cardiovascular:     Rate and Rhythm: Normal rate.  Pulmonary:     Effort: No respiratory distress.  Neurological:     Mental Status: She is lethargic.    Palliative Assessment/Data: PPS 20%     Assessment & Plan:   SUMMARY OF RECOMMENDATIONS   Continue current care with no escalation Plan for transition to full comfort care tomorrow after patient's brother arrives Dilaudid 0.5-1 mg IV every 4 hours as needed for pain or shortness of breath Discontinue fluid restriction PMT will follow-up tomorrow  Primary Decision Maker: NEXT OF KIN - 2 sons  Code Status/Advance Care Planning: DNR/DNI  Prognosis:  Hours - Days  Discharge Planning:  To Be Determined    Thank you for allowing [place] to participate in the care of WAYLYN PROSISE  Time Total: 90 minutes  Detailed review of medical records (labs, imaging, vital signs), medically appropriate exam, discussed with treatment team, counseling and education to patient, family, & staff, documenting clinical information, medication management, coordination of care.   Signed by: Sherlean Foot, NP Palliative Medicine Team  Team Phone # [phone]  For individual providers, please see AMION

## 2023-04-12 NOTE — TOC Progression Note (Addendum)
Transition of Care [facility] [place]) - Progression Note    Patient Details  Name: [name] MRN: [id number] Date of Birth: [birth year]  Transition of Care [facility]) CM/SW Contact  Nicanor Bake Phone Number: [phone] 04/12/2023, 10:59 AM  Clinical Narrative:   HF CSW attempted to call the pts room at 10:58AM and check-in. Pt did not answer the phone. CSW will continue to make contact with the pt.   TOC will continue following.     Expected Discharge Plan: Home/Self Care Barriers to Discharge: Continued Medical Work up  Expected Discharge Plan and Services       Living arrangements for the past 2 months: Mobile Home                                       Social Determinants of Health (SDOH) Interventions SDOH Screenings   Food Insecurity: No Food Insecurity (04/05/2023)  Housing: Low Risk  (04/05/2023)  Transportation Needs: No Transportation Needs (04/05/2023)  Utilities: Not At Risk (04/05/2023)  Tobacco Use: Medium Risk (04/05/2023)    Readmission Risk Interventions     No data to display

## 2023-04-12 NOTE — Assessment & Plan Note (Signed)
Continue blood pressure monitoring

## 2023-04-12 NOTE — Assessment & Plan Note (Signed)
Discontinue insulin therapy.  Patient with poor oral intake.

## 2023-04-12 NOTE — Assessment & Plan Note (Addendum)
Echocardiogram with preserved LV systolic function with  EF 55 to 60%, severe LVH, interventricular septum is flattened in systole and diastole, RVSP 78 mmHg, mild reduction in RV systolic function, RV with moderate dilatation, mild increased RV wall thickness, LA and RA with severe dilatation, moderate mitral regurgitation with severe mitral stenosis, moderate TR, moderate to severe aortic valve stenosis, mild to moderate aortic regurgitation.   TEE with severe reduction in RV systolic function, severe mitral stenosis, moderate to severe mitral regurgitation, moderate to severe aortic stenosis, moderate aortic regurgitation.   Rheumatic valvular heart disease.  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   Patient has failed medical therapy, and continue to deteriorate heart failure. Developed cardiogenic shock with hypoperfusion.  Not candidate to invasive interventions or advance therapies. .  Plan to continue with palliative diuretic therapy, will transition to torsemide 80 mg po bid.  Continue care under hospice.

## 2023-04-13 DIAGNOSIS — I5033 Acute on chronic diastolic (congestive) heart failure: Secondary | ICD-10-CM | POA: Diagnosis not present

## 2023-04-13 DIAGNOSIS — E1169 Type 2 diabetes mellitus with other specified complication: Secondary | ICD-10-CM

## 2023-04-13 DIAGNOSIS — E039 Hypothyroidism, unspecified: Secondary | ICD-10-CM

## 2023-04-13 DIAGNOSIS — I059 Rheumatic mitral valve disease, unspecified: Secondary | ICD-10-CM | POA: Diagnosis not present

## 2023-04-13 DIAGNOSIS — E785 Hyperlipidemia, unspecified: Secondary | ICD-10-CM

## 2023-04-13 DIAGNOSIS — I5031 Acute diastolic (congestive) heart failure: Secondary | ICD-10-CM | POA: Diagnosis not present

## 2023-04-13 DIAGNOSIS — Z515 Encounter for palliative care: Secondary | ICD-10-CM | POA: Diagnosis not present

## 2023-04-13 DIAGNOSIS — I350 Nonrheumatic aortic (valve) stenosis: Secondary | ICD-10-CM | POA: Diagnosis not present

## 2023-04-13 DIAGNOSIS — I480 Paroxysmal atrial fibrillation: Secondary | ICD-10-CM | POA: Diagnosis not present

## 2023-04-13 LAB — GLUCOSE, CAPILLARY: Glucose-Capillary: 121 mg/dL — ABNORMAL HIGH (ref 70–99)

## 2023-04-13 MED ORDER — BIOTENE DRY MOUTH MT LIQD: 15.0000 mL | OROMUCOSAL | Status: DC | PRN

## 2023-04-13 MED ORDER — GLYCOPYRROLATE 0.2 MG/ML IJ SOLN: 0.2000 mg | INTRAMUSCULAR | Status: DC | PRN

## 2023-04-13 MED ORDER — LORAZEPAM 2 MG/ML IJ SOLN: 1.0000 mg | INTRAMUSCULAR | Status: DC | PRN

## 2023-04-13 MED ORDER — LORAZEPAM 1 MG PO TABS
1.0000 mg | ORAL_TABLET | ORAL | Status: DC | PRN
Start: 2023-04-13 — End: 2023-04-14

## 2023-04-13 MED ORDER — GLYCOPYRROLATE 1 MG PO TABS
1.0000 mg | ORAL_TABLET | ORAL | Status: DC | PRN
Start: 2023-04-13 — End: 2023-04-14

## 2023-04-13 MED ORDER — TORSEMIDE 20 MG PO TABS
80.0000 mg | ORAL_TABLET | Freq: Two times a day (BID) | ORAL | Status: DC
  Administered 2023-04-13 – 2023-04-14 (×3): 80 mg via ORAL
  Filled 2023-04-13 (×3): qty 4

## 2023-04-13 MED ORDER — LORAZEPAM 2 MG/ML PO CONC
1.0000 mg | ORAL | Status: DC | PRN
Start: 2023-04-13 — End: 2023-04-14

## 2023-04-13 NOTE — Progress Notes (Signed)
Mobility Specialist Progress Note:    04/13/23 0900  Mobility  Activity Transferred to/from [facility]  Level of Assistance Minimal assist, patient does 75% or more  Assistive Device Front wheel walker  Distance Ambulated (ft) 4 ft  Activity Response Tolerated fair  Mobility Referral Yes  Mobility visit 1 Mobility  Mobility Specialist Start Time (ACUTE ONLY) [id number]  Mobility Specialist Stop Time (ACUTE ONLY) 0907  Mobility Specialist Time Calculation (min) (ACUTE ONLY) 3 min   Pt received on EOB, requesting assistance to [facility]. Required cues for direction to [facility]. Pt left on [facility] w/ call bell and family present.  D'Vante Earlene Plater Mobility Specialist Please contact via Special educational needs teacher or Rehab office at [phone]

## 2023-04-13 NOTE — Assessment & Plan Note (Signed)
Continue with levothyroxine as tolerated.

## 2023-04-13 NOTE — Plan of Care (Signed)

## 2023-04-13 NOTE — Progress Notes (Addendum)
Palliative Medicine Progress Note   Patient Name: [name]       Date: 04/13/2023 DOB: [birth year]  Age: 72 y.o. MRN#: [id number] Attending Physician: Coralie Keens Primary Care Physician: Noberto Retort, MD Admit Date: 04/05/2023   HPI/Patient Profile: 72 y.o. female  with past medical history of rheumatoid arthritis, paroxysmal A-fib on Coumadin, moderate MR/severe MS, diabetes type 2, hypertension, hyperlipidemia, hypothyroidism, and breast cancer s/p left mastectomy and chemoradiation.  She presented to the ED on 04/05/2023 with 1-2 week history of progressive exertional dyspnea and dizziness.  On admission she was noted to be volume overloaded.  Echocardiogram showed severe mitral stenosis, moderate MR, and moderate to severe AS. Seen by cardiology and deemed not a candidate for surgical intervention.    Palliative Medicine has been consulted for goals of care and medical decision making   Subjective: Chart reviewed, and updates received from RN.   Bedside visit. Patient is awake; she denies pain or shortness of breath.   Sister/Kathy is at bedside. She reports patient's brother should be arriving to the hospital shortly and she is ready to start transitioning to more comfort-focused care. She reports patient complained of shortness of breath overnight, but felt better after she received lasix. I provided education on the indication for opioids in management of dyspnea at EOL.   Questions and concerns addressed. Olegario Messier plans to take things "one day at a time". The plan for today is allowing patient to enjoy some quality time with family.    Objective:  Physical Exam Vitals reviewed.  Constitutional:      General: She is not in acute distress.    Appearance: She is  ill-appearing.  Pulmonary:     Effort: No respiratory distress.  Skin:    General: Skin is warm and dry.  Neurological:     Mental Status: She is alert.             Palliative Medicine Assessment & Plan   Assessment: Principal Problem:   Acute on chronic diastolic (congestive) heart failure (HCC) Active Problems:   Atrial fibrillation (HCC)   Rheumatoid arthritis (HCC)   Chronic pain syndrome   Type 2 diabetes mellitus with hyperlipidemia (HCC)   HTN (hypertension)   Chronic narcotic use   Acute heart failure with preserved ejection fraction (HFpEF) (HCC)  Mitral valve disease   Mitral valve stenosis and regurgitation   Moderate to severe aortic stenosis   Non-insulin dependent type 2 diabetes mellitus (HCC)   Long term (current) use of anticoagulants   Hypothyroidism    Recommendations/Plan: Transition to comfort care D/C heparin infusion, CBG monitoring, labs, cardiac monitoring Minimize medications Place foley catheter Referral to inpatient hospice - TOC aware PMT will continue to follow  Symptom Management:  Dilaudid prn for pain or dyspnea Lorazepam (ATIVAN) prn for anxiety Haloperidol (HALDOL) prn for agitation  Glycopyrrolate (ROBINUL) for excessive secretions Ondansetron (ZOFRAN) prn for nausea Polyvinyl alcohol (LIQUIFILM TEARS) prn for dry eyes Antiseptic oral rinse (BIOTENE) prn for dry mouth   Code Status: DNR  Prognosis:  < 2 weeks  Discharge Planning: To Be Determined  Care plan was discussed with Dr. Ella Jubilee  Thank you for allowing the Palliative Medicine Team to assist in the care of this patient.   Time: 35 minutes   Merry Proud, NP   Please contact Palliative Medicine Team phone at [phone] for questions and concerns.  For individual providers, please see AMION.

## 2023-04-13 NOTE — TOC Initial Note (Addendum)
Transition of Care [facility]) - Initial/Assessment Note    Patient Details  Name: [name] MRN: [id number] Date of Birth: [birth year]  Transition of Care [facility]) CM/SW Contact:    Elliot Cousin, RN Phone Number: [phone] 04/13/2023, 12:01 PM  Clinical Narrative:                 CM spoke to pt and family at bedside. Offered choice for Residential Hospice, pt requested [facility]. Contacted [facility] with new referral.  Received notification from [employer], Melissa. Pt is not appropriate for Residential Hospice.   Spoke to pt and family at bedside. They are requesting [facility]. Contacted rep, Cheri to review for appropriateness at their facility. [facility] approved for Residential Hospice. Plan dc tomorrow.   Expected Discharge Plan: Hospice Medical Facility Barriers to Discharge: Continued Medical Work up   Patient Goals and CMS Choice Patient states their goals for this hospitalization and ongoing recovery are:: wants to be comfortable CMS Medicare.gov Compare Post Acute Care list provided to:: Patient Choice offered to / list presented to : Patient      Expected Discharge Plan and Services   Discharge Planning Services: CM Consult Post Acute Care Choice: Residential Hospice Bed Living arrangements for the past 2 months: Mobile Home                           HH Arranged: RN [facility] Agency: [facility]  (Physicist, medical) Date [facility] Agency Contacted: 04/13/23 Time HH Agency Contacted: 1200 Representative spoke with at [facility] Agency: Melissa  Prior Living Arrangements/Services Living arrangements for the past 2 months: Mobile Home Lives with:: Self, Pets Patient language and need for interpreter reviewed:: Yes Do you feel safe going back to the place where you live?: Yes      Need for Family Participation in Patient Care: No (Comment) Care giver support system in place?: No (comment)   Criminal Activity/Legal  Involvement Pertinent to Current Situation/Hospitalization: No - Comment as needed  Activities of Daily Living   ADL Screening (condition at time of admission) Independently performs ADLs?: Yes (appropriate for developmental age) Is the patient deaf or have difficulty hearing?: No Does the patient have difficulty seeing, even when wearing glasses/contacts?: No Does the patient have difficulty concentrating, remembering, or making decisions?: No  Permission Sought/Granted Permission sought to share information with : Case Manager, Family Supports Permission granted to share information with : Yes, Verbal Permission Granted  Share Information with NAME: [place] Howell     Permission granted to share info w Relationship: brother  Permission granted to share info w Contact Information: [phone]  Emotional Assessment Appearance:: Appears stated age Attitude/Demeanor/Rapport: Engaged Affect (typically observed): Appropriate Orientation: : Oriented to Self, Oriented to Place, Oriented to  Time, Oriented to Situation Alcohol / Substance Use: Not Applicable Psych Involvement: No (comment)  Admission diagnosis:  Hypokalemia [E87.6] Acute on chronic diastolic (congestive) heart failure (HCC) [I50.33] Acute congestive heart failure, unspecified heart failure type (HCC) [I50.9] Patient Active Problem List   Diagnosis Date Noted   Hypothyroidism 04/13/2023   Acute heart failure with preserved ejection fraction (HFpEF) (HCC) 04/06/2023   Mitral valve disease 04/06/2023   Mitral valve stenosis and regurgitation 04/06/2023   Moderate to severe aortic stenosis 04/06/2023   Non-insulin dependent type 2 diabetes mellitus (HCC) 04/06/2023   Long term (current) use of anticoagulants 04/06/2023   Acute on chronic diastolic (congestive) heart failure (  HCC) 04/05/2023   Pyohydronephrosis 10/27/2021   Chronic pain syndrome 10/27/2021   Type 2 diabetes mellitus with hyperlipidemia (HCC) 10/27/2021    HTN (hypertension) 10/27/2021   Hypokalemia 10/27/2021   Other long term (current) drug therapy 10/12/2021   Atrial fibrillation (HCC)    Chronic narcotic use 11/03/2020   Pressure injury of skin 01/22/2018   Acute pyelonephritis    Kidney stone    Sepsis (HCC) 01/19/2018   Rheumatoid arthritis (HCC) 09/21/2017   PCP:  Noberto Retort, MD Pharmacy:   [id number] - [place], [place] - [address] [place] [address] Phone: [phone] Fax: [phone]     Social Drivers of Health (SDOH) Social History: SDOH Screenings   Food Insecurity: No Food Insecurity (04/05/2023)  Housing: Low Risk  (04/05/2023)  Transportation Needs: No Transportation Needs (04/05/2023)  Utilities: Not At Risk (04/05/2023)  Tobacco Use: Medium Risk (04/05/2023)   SDOH Interventions:     Readmission Risk Interventions     No data to display

## 2023-04-13 NOTE — Progress Notes (Signed)
Progress Note   Patient: [name]:[id number] DOB: [birth year] DOA: 04/05/2023     8 DOS: the patient was seen and examined on 04/13/2023   Brief hospital course: [name] was admitted to the hospital with the working diagnosis of heart failure exacerbation.   72/F w/RA, breast cancer status postlumpectomy and chemoradiation with subsequent left mastectomy, paroxysmal A-fib on Coumadin, moderate MR/severe MS, diabetes type 2, hypertension, hyperlipidemia, hypothyroidism who presented with 1 to 2 weeks history of progressive exertional dyspnea, dizziness.  -On admission noted to be volume overloaded echo showed severe mitral stenosis, mod MR, Mod to Severe AS.  Cardiology consulted.   -diuresed,  -12/9: TEE: Confirmed severe mitral stenosis and moderate to severe aortic stenosis, CHF team discussed with CT surgery, not a candidate for surgical valve repair 12/12 patient developed signs of cardiogenic shock with high lactate and encephalopathy.  Poor prognosis, her family decided to continue care under comfort care.  She will need residential hospice or SNF with hospice care. At this point in time can not return home.   Assessment and Plan: * Acute on chronic diastolic (congestive) heart failure (HCC) Echocardiogram with preserved LV systolic function with  EF 55 to 60%, severe LVH, interventricular septum is flattened in systole and diastole, RVSP 78 mmHg, mild reduction in RV systolic function, RV with moderate dilatation, mild increased RV wall thickness, LA and RA with severe dilatation, moderate mitral regurgitation with severe mitral stenosis, moderate TR, moderate to severe aortic valve stenosis, mild to moderate aortic regurgitation.   Rheumatic valvular heart disease.  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   Patient has failed medical therapy, and continue to deteriorate heart failure. Developed cardiogenic shock with hypoperfusion.  Not  candidate to invasive interventions or advance therapies. .  Plan to continue with palliative diuretic therapy, will transition to torsemide 80 mg po bid.  Discontinue spironolactone due to possible electrolyte side effects.     Atrial fibrillation (HCC) Continue rate control with diltiazem to control heart rate. At this point risk vs benefit will hold on further warfarin dosing.  Patient with poor prognosis with risk of bleeding.   Type 2 diabetes mellitus with hyperlipidemia (HCC) Discontinue insulin therapy.  Patient with poor oral intake.   HTN (hypertension) Holding anti hypertensive medications.   Chronic pain syndrome Continue with as needed oxycodone.  Bid morphine 30 mg q12 hrs  Continue with bupropion.   Pressure ulcer  , ,  Active Pressure Injury/Wound(s)     Pressure Ulcer  Duration          Pressure Injury 04/05/23 Buttocks Right;Left Stage 2 -  Partial thickness loss of dermis presenting as a shallow open injury with a red, pink wound bed without slough. 7 days            Rheumatoid arthritis (HCC) Discontinue hydroxychloroquine.   Hypothyroidism Continue with levothyroxine as tolerated.    Subjective: Patient somnolent, responds to voice, not much interactive   Physical Exam: Vitals:   04/12/23 1800 04/12/23 1950 04/12/23 2312 04/13/23 0340  BP:  120/79 (!) 114/58 133/79  Pulse:  (!) 50 (!) 42 (!) 46  Resp:  15 15 14   Temp:  (!) 97.5 F (36.4 C) 97.6 F (36.4 C) (!) 97.5 F (36.4 C)  TempSrc:  Oral Oral Oral  SpO2: 95% 94% 100% 96%  Weight:      Height:       Neurology somnolent but poorly responsive, deconditioned and ill looking  appearing  ENT with pallor Cardiovascular with systolic murmur at the base and diastolic murmur at the apex Respiratory with rales at bases with no wheezing Abdomen with no distention Positive lower extremity edema ++ cold lower extremities  Data Reviewed:    Family Communication: I spoke with patient's  sister at the bedside, we talked in detail about patient's condition, plan of care and prognosis and all questions were addressed.   Disposition: Status is: Inpatient Remains inpatient appropriate because: pending placement residential hospice or SNF with hospice care.   Planned Discharge Destination: Skilled nursing facility      Author: Coralie Keens, MD 04/13/2023 11:32 AM  For on call review [email or web address].

## 2023-04-14 DIAGNOSIS — E1169 Type 2 diabetes mellitus with other specified complication: Secondary | ICD-10-CM | POA: Diagnosis not present

## 2023-04-14 DIAGNOSIS — I10 Essential (primary) hypertension: Secondary | ICD-10-CM | POA: Diagnosis not present

## 2023-04-14 DIAGNOSIS — I5033 Acute on chronic diastolic (congestive) heart failure: Secondary | ICD-10-CM | POA: Diagnosis not present

## 2023-04-14 DIAGNOSIS — N390 Urinary tract infection, site not specified: Secondary | ICD-10-CM

## 2023-04-14 DIAGNOSIS — I480 Paroxysmal atrial fibrillation: Secondary | ICD-10-CM | POA: Diagnosis not present

## 2023-04-14 MED ORDER — TORSEMIDE 40 MG PO TABS: 80.0000 mg | ORAL_TABLET | Freq: Two times a day (BID) | ORAL | 0 refills | Status: AC

## 2023-04-14 NOTE — Discharge Summary (Signed)
Physician Discharge Summary   Patient: [name] MRN: [id number] DOB: 18-Mar-1951  Admit date:     04/05/2023  Discharge date: 04/14/23  Discharge Physician: York Ram Benjamin Merrihew   PCP: Noberto Retort, MD   Recommendations at discharge:    Patient will continue palliative diuresis with torsemide 80 mg po bid Continue diltiazem to control atrial fibrillation.  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 Not candidate for invasive or advance therapies.  Continue care under hospice services.  Discharge Diagnoses: Principal Problem:   Acute on chronic diastolic (congestive) heart failure (HCC) Active Problems:   Atrial fibrillation (HCC)   Type 2 diabetes mellitus with hyperlipidemia (HCC)   HTN (hypertension)   Chronic pain syndrome   Rheumatoid arthritis (HCC)   UTI (urinary tract infection)   Hypothyroidism  Resolved Problems:   * No resolved hospital problems. [facility] Course: [name] was admitted to the hospital with the working diagnosis of heart failure exacerbation.   72/F w/RA, breast cancer status postlumpectomy and chemoradiation with subsequent left mastectomy, paroxysmal A-fib on Coumadin, moderate MR/severe MS, diabetes type 2, hypertension, hyperlipidemia, hypothyroidism who presented with 1 to 2 weeks history of progressive exertional dyspnea, dizziness.  At home she had worsening dyspnea to the point she was having symptoms with minimal efforts. Positive lower extremity edema, PND and orthopnea.  On her initial physical examination her heart rate was 120, blood pressure 136/86, RR 21 and 02 saturation 93%, lungs with no wheezing, but bilateral rales, heart with S1 and S2 present with positive diastolic murmur a the apex, abdomen with no distention and positive lower extremity edema.   Na 139, K 3,0 Cl 99, bicarbonate 27 glucose 207 bun 20 cr 0,84 Mag 1.2  AST 22 ALT 18  BNP 1,498  High  sensitive troponin 71 and 67  Wbc 12.5 hgb 9,9 plt 330   Urine analysis SG 1,018, protein > 300, many bacteria, large leukocytes, wbc 21-50, small hgb, 11-20 rbc,  Urine culture positive for E coli 100, 000 CFU  Chest radiograph with left rotation, cardiomegaly, bilateral hilar vascular congestion, noted enlarged LA. No effusions.   EKG 127 bpm, right axis deviation, normal intervals, sinus rhythm with bi atrial enlargement, with no significant ST segment or T wave changes.   Echocardiogram showed severe mitral stenosis, mod MR, Mod to Severe AS.   -12/9: TEE: Confirmed severe mitral stenosis and moderate to severe aortic stenosis, CHF team discussed with CT surgery, not a candidate for surgical valve repair 12/12 patient developed signs of cardiogenic shock with high lactate and encephalopathy.  Poor prognosis, her family decided to continue care under comfort care.  She will need residential hospice or SNF with hospice care. At this point in time can not return home.  12/13 patient will be transferred to residential hospice to continue her care.   Assessment and Plan: * Acute on chronic diastolic (congestive) heart failure (HCC) Echocardiogram with preserved LV systolic function with  EF 55 to 60%, severe LVH, interventricular septum is flattened in systole and diastole, RVSP 78 mmHg, mild reduction in RV systolic function, RV with moderate dilatation, mild increased RV wall thickness, LA and RA with severe dilatation, moderate mitral regurgitation with severe mitral stenosis, moderate TR, moderate to severe aortic valve stenosis, mild to moderate aortic regurgitation.   TEE with severe reduction in RV systolic function, severe mitral stenosis, moderate to severe mitral regurgitation, moderate to severe aortic stenosis,  moderate aortic regurgitation.   Rheumatic valvular heart disease.  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   Patient has failed medical  therapy, and continue to deteriorate heart failure. Developed cardiogenic shock with hypoperfusion.  Not candidate to invasive interventions or advance therapies. .  Plan to continue with palliative diuretic therapy, will transition to torsemide 80 mg po bid.  Continue care under hospice.   Atrial fibrillation (HCC) Continue rate control with diltiazem to control heart rate. At this point risk vs benefit will hold on further warfarin dosing.  Patient with poor prognosis with risk of bleeding.   Type 2 diabetes mellitus with hyperlipidemia (HCC) Discontinue insulin therapy.  Patient with poor oral intake.   HTN (hypertension) Holding anti hypertensive medications.   Chronic pain syndrome Continue with as needed oxycodone.  Bid morphine 30 mg q12 hrs  Continue with bupropion.   Pressure ulcer  , ,  Active Pressure Injury/Wound(s)     Pressure Ulcer  Duration          Pressure Injury 04/05/23 Buttocks Right;Left Stage 2 -  Partial thickness loss of dermis presenting as a shallow open injury with a red, pink wound bed without slough. 7 days            Rheumatoid arthritis (HCC) Discontinue hydroxychloroquine.   UTI (urinary tract infection) Patient was treated with cephalexin with good toleration.   Hypothyroidism Continue with levothyroxine as tolerated.          Consultants: cardiology , palliative care  Procedures performed: TEE Disposition: Hospice care Diet recommendation:  Cardiac diet DISCHARGE MEDICATION: Allergies as of 04/14/2023       Reactions   Fish Allergy Anaphylaxis, Hives   Amaryl [glimepiride] Itching   Avalide [irbesartan-hydrochlorothiazide] Itching   Avandia [rosiglitazone] Itching   Byetta 10 Mcg Pen [exenatide] Itching   Codeine Itching   Other reaction(s): Unknown   Demerol [meperidine Hcl] Itching   Glucovance [glyburide-metformin] Itching   Iodinated Contrast Media Itching   Other reaction(s):itching   Lisinopril Itching    Naprosyn [naproxen] Hives   Penicillins Itching, Other (See Comments)   Has patient had a PCN reaction causing immediate rash, facial/tongue/throat swelling, SOB or lightheadedness with hypotension: no Has patient had a PCN reaction causing severe rash involving mucus membranes or skin necrosis: no Has patient had a PCN reaction that required hospitalization: yes Has patient had a PCN reaction occurring within the last 10 years: no If all of the above answers are "NO", then may proceed with Cephalosporin use.        Medication List     STOP taking these medications    denosumab 60 MG/ML Sosy injection Commonly known as: PROLIA   folic acid 1 MG tablet Commonly known as: FOLVITE   furosemide 20 MG tablet Commonly known as: LASIX   hydrochlorothiazide 25 MG tablet Commonly known as: HYDRODIURIL   hydroxychloroquine 200 MG tablet Commonly known as: PLAQUENIL   losartan 50 MG tablet Commonly known as: COZAAR   metFORMIN 500 MG 24 hr tablet Commonly known as: GLUCOPHAGE-XR   multivitamin with minerals Tabs tablet   Orencia 250 MG injection Generic drug: abatacept   pravastatin 40 MG tablet Commonly known as: PRAVACHOL   warfarin 5 MG tablet Commonly known as: COUMADIN       TAKE these medications    buPROPion 150 MG 24 hr tablet Commonly known as: WELLBUTRIN XL Take 150 mg by mouth every evening.   diltiazem 300 MG 24 hr  capsule Commonly known as: Tiadylt ER Take 1 capsule (300 mg total) by mouth daily.   escitalopram 10 MG tablet Commonly known as: LEXAPRO Take 10 mg by mouth every evening.   esomeprazole 20 MG capsule Commonly known as: NEXIUM Take 40 mg by mouth every evening.   HYDROcodone-acetaminophen 10-325 MG tablet Commonly known as: NORCO Take 1 tablet by mouth every 6 (six) hours as needed for severe pain (post-operatively).   hydrocortisone 25 MG suppository Commonly known as: ANUSOL-HC Place 25 mg rectally 2 (two) times daily as  needed for hemorrhoids.   levothyroxine 175 MCG tablet Commonly known as: SYNTHROID Take 175 mcg by mouth every evening.   morphine 30 MG 12 hr tablet Commonly known as: MS CONTIN Take 30 mg by mouth every 12 (twelve) hours. 300 am and 300 pm   Torsemide 40 MG Tabs Take 80 mg by mouth 2 (two) times daily.        Discharge Exam: Filed Weights   04/10/23 0447 04/11/23 1039 04/12/23 0324  Weight: 53.9 kg 55.8 kg 54.4 kg   BP 132/64 (BP Location: Right Arm)   Pulse (!) 53   Temp 97.7 F (36.5 C) (Axillary)   Resp 14   Ht 5\' 3"  (1.6 m)   Wt 54.4 kg   SpO2 98%   BMI 21.24 kg/m   Patient with no chest pain or severe dyspnea at rest, deconditioned and ill looking appearing.   Neurology somnolent but easy to arouse, deconditioned and ill looking appearing  ENT with mild pallor Cardiovascular with S1 and S2 present and regular, positive diastolic murmur at the apex and systolic murmur at the base Respiratory with rales at bases with no wheezing or rhonchi, poor inspiratory effort Abdomen with no distention   Condition at discharge: stable  The results of significant diagnostics from this hospitalization (including imaging, microbiology, ancillary and laboratory) are listed below for reference.   Imaging Studies: ECHO TEE Result Date: 04/10/2023    TRANSESOPHOGEAL ECHO REPORT   Patient Name:   [name] Date of Exam: 04/10/2023 Medical Rec #:  [id number]        Height:       63.0 in Accession #:    [id number]       Weight:       118.9 lb Date of Birth:  04-24-1951         BSA:          1.550 m Patient Age:    72 years         BP:           145/67 mmHg Patient Gender: F                HR:           71 bpm. Exam Location:  Inpatient Procedure: Transesophageal Echo, 3D Echo, Cardiac Doppler and Color Doppler Indications:     I35.0 Nonrheumatic aortic (valve) stenosis; I34.2 Nonrheumatic                  mitral (valve) stenosis; I34.0 Nonrheumatic mitral (valve)                   insufficiency  History:         Patient has prior history of Echocardiogram examinations, most                  recent 04/06/2023. Abnormal ECG, Aortic Valve Disease and Mitral  Valve Disease, Arrythmias:Atrial Fibrillation; Risk                  Factors:Hypertension and Diabetes. Aortic stenosis. Mitral                  stenosis.  Sonographer:     Sheralyn Boatman RDCS Referring Phys:  [id number] [facility] Diagnosing Phys: Arvilla Meres MD PROCEDURE: After discussion of the risks and benefits of a TEE, an informed consent was obtained from the patient. The transesophogeal probe was passed without difficulty through the esophogus of the patient. Imaged were obtained with the patient in a left lateral decubitus position. Local oropharyngeal anesthetic was provided with viscous lidocaine. Sedation performed by different physician. The patient was monitored while under deep sedation. Anesthestetic sedation was provided intravenously by Anesthesiology: 156mg  of Propofol. The patient's vital signs; including heart rate, blood pressure, and oxygen saturation; remained stable throughout the procedure. The patient developed no complications during the procedure.  IMPRESSIONS  1. Left ventricular ejection fraction, by estimation, is 70 to 75%. The left ventricle has hyperdynamic function.  2. Right ventricular systolic function is severely reduced. The right ventricular size is mildly enlarged.  3. Left atrial size was severely dilated. No left atrial/left atrial appendage thrombus was detected.  4. Right atrial size was moderately dilated.  5. The MV and MV annulus are heavily calcified. The posterior leaflet appears immobile. There is severe mitral stenosis. There is moderate to severe MR with prominent systolic flow reversal in the pulmonary veins. MVA 0.9 cm2. Mean gradient 9-10. 3D MV EROA is 0.56. The mitral valve is degenerative. Moderate to severe mitral valve regurgitation. Severe mitral stenosis. The  mean mitral valve gradient is 9.0 mmHg.  6. Tricuspid valve regurgitation is moderate.  7. The aortic valve is heavily calcified with nearly immobile leaflets. Mean gradient ranges 20-24. AVA by VTI was 1.14cm2 with VR 0.33. AVA by planimetry in 0.46cm2. The aortic valve is tricuspid. There is moderate calcification of the aortic valve. Aortic valve regurgitation is moderate. Moderate to severe aortic valve stenosis. Aortic valve area, by VTI measures 1.17 cm. Aortic valve mean gradient measures 21.7 mmHg. Aortic valve Vmax measures 2.88 m/s.  8. The left upper pulmonary vein is abnormal. FINDINGS  Left Ventricle: Left ventricular ejection fraction, by estimation, is 70 to 75%. The left ventricle has hyperdynamic function. The left ventricular internal cavity size was normal in size. Right Ventricle: The right ventricular size is mildly enlarged. No increase in right ventricular wall thickness. Right ventricular systolic function is severely reduced. Left Atrium: Left atrial size was severely dilated. No left atrial/left atrial appendage thrombus was detected. Right Atrium: Right atrial size was moderately dilated. Pericardium: There is no evidence of pericardial effusion. Mitral Valve: The MV and MV annulus are heavily calcified. The posterior leaflet appears immobile. There is severe mitral stenosis. There is moderate to severe MR with prominent systolic flow reversal in the pulmonary veins. MVA 0.9 cm2. Mean gradient 9-10. 3D MV EROA is 0.56. The mitral valve is degenerative in appearance. There is severe calcification of the mitral valve leaflet(s). Severely decreased mobility of the mitral valve leaflets. Moderate to severe mitral valve regurgitation. Severe mitral  valve stenosis. MV peak gradient, 21.6 mmHg. The mean mitral valve gradient is 9.0 mmHg. Tricuspid Valve: The tricuspid valve is grossly normal. Tricuspid valve regurgitation is moderate . No evidence of tricuspid stenosis. Aortic Valve: The aortic  valve is heavily calcified with nearly immobile leaflets. Mean gradient ranges  20-24. AVA by VTI was 1.14cm2 with VR 0.33. AVA by planimetry in 0.46cm2. The aortic valve is tricuspid. There is moderate calcification of the aortic  valve. Aortic valve regurgitation is moderate. Moderate to severe aortic stenosis is present. Aortic valve mean gradient measures 21.7 mmHg. Aortic valve peak gradient measures 33.2 mmHg. Aortic valve area, by VTI measures 1.17 cm. Pulmonic Valve: The pulmonic valve was normal in structure. Pulmonic valve regurgitation is trivial. Aorta: The aortic root and ascending aorta are structurally normal, with no evidence of dilitation. Venous: The left upper pulmonary vein is abnormal. A pattern of systolic flow reversal, suggestive of severe mitral regurgitation is recorded from the left upper pulmonary vein. IAS/Shunts: No atrial level shunt detected by color flow Doppler. Additional Comments: Spectral Doppler performed. LEFT VENTRICLE PLAX 2D LVOT diam:     2.10 cm LV SV:         80 LV SV Index:   51 LVOT Area:     3.46 cm  AORTIC VALVE AV Area (Vmax):    1.14 cm AV Area (Vmean):   1.12 cm AV Area (VTI):     1.17 cm AV Vmax:           288.00 cm/s AV Vmean:          219.667 cm/s AV VTI:            0.679 m AV Peak Grad:      33.2 mmHg AV Mean Grad:      21.7 mmHg LVOT Vmax:         94.40 cm/s LVOT Vmean:        71.100 cm/s LVOT VTI:          0.230 m LVOT/AV VTI ratio: 0.34  AORTA Ao Asc diam: 3.30 cm MITRAL VALVE                  TRICUSPID VALVE MV Area VTI:  0.90 cm        TR Peak grad:   43.0 mmHg MV Peak grad: 21.6 mmHg       TR Vmax:        328.00 cm/s MV Mean grad: 9.0 mmHg MV Vmax:      2.33 m/s        SHUNTS MV Vmean:     146.0 cm/s      Systemic VTI:  0.23 m MR Peak grad:    147.6 mmHg   Systemic Diam: 2.10 cm MR Mean grad:    101.0 mmHg MR Vmax:         607.50 cm/s MR Vmean:        469.5 cm/s MR PISA:         2.26 cm MR PISA Eff ROA: 15 mm MR PISA Radius:  0.60 cm Arvilla Meres MD Electronically signed by Arvilla Meres MD Signature Date/Time: 04/10/2023/1:20:05 PM    Final    EP STUDY Result Date: 04/10/2023 See surgical note for result.  ECHOCARDIOGRAM COMPLETE Result Date: 04/06/2023    ECHOCARDIOGRAM REPORT   Patient Name:   [name] Date of Exam: 04/06/2023 Medical Rec #:  [id number]        Height:       63.0 in Accession #:    [id number]       Weight:       117.9 lb Date of Birth:  01/21/1951         BSA:          1.545 m  Patient Age:    72 years         BP:           160/85 mmHg Patient Gender: F                HR:           110 bpm. Exam Location:  Inpatient Procedure: 2D Echo, Cardiac Doppler, Color Doppler and Strain Analysis Indications:     CHF- Acute Diastolic I50.31  History:         Patient has prior history of Echocardiogram examinations, most                  recent 08/29/2021. CHF, Arrythmias:Atrial Fibrillation; Risk                  Factors:Hypertension, Diabetes and Dyslipidemia.  Sonographer:     Lucendia Herrlich RCS Referring Phys:  [id number] Boyce Medici GONFA Diagnosing Phys: Clearnce Hasten  Sonographer Comments: Image acquisition challenging due to uncooperative patient. Attempted Global Longitudinal Strain IMPRESSIONS  1. LV intracavitary gradient of . Left ventricular ejection fraction, by estimation, is 55 to 60%. The left ventricle has normal function. The left ventricle has no regional wall motion abnormalities. There is severe left ventricular hypertrophy. Left ventricular diastolic parameters are consistent with Grade III diastolic dysfunction (restrictive). There is the interventricular septum is flattened in systole and diastole, consistent with right ventricular pressure and volume overload. The average left ventricular global longitudinal strain is -5.3 %. The global longitudinal strain is abnormal.  2. RVSP . Right ventricular systolic function is mildly reduced. The right ventricular size is moderately enlarged. Mildly  increased right ventricular wall thickness. There is severely elevated pulmonary artery systolic pressure.  3. Left atrial size was severely dilated.  4. Right atrial size was severely dilated.  5. The mitral valve is rheumatic. Moderate mitral valve regurgitation. Severe mitral stenosis. The mean mitral valve gradient is 20.5 mmHg. Severe mitral annular calcification.  6. The tricuspid valve is degenerative. Tricuspid valve regurgitation is moderate.  7. Heavily calcified with SVI of 51mL/m2, DVI 0.325 consistent with at least moderate low flow, low gradient AS. The aortic valve is normal in structure. There is severe calcifcation of the aortic valve. There is severe thickening of the aortic valve. Aortic valve regurgitation is mild to moderate. Moderate to severe aortic valve stenosis. Aortic valve area, by VTI measures 0.78 cm. Aortic valve mean gradient measures 16.0 mmHg. Aortic valve Vmax measures 2.71 m/s.  8. Systolic notching of PA doppler waveform consistent with pulmonary hypertension.  9. The inferior vena cava is normal in size with <50% respiratory variability, suggesting right atrial pressure of 8 mmHg. FINDINGS  Left Ventricle: LV intracavitary gradient of . Left ventricular ejection fraction, by estimation, is 55 to 60%. The left ventricle has normal function. The left ventricle has no regional wall motion abnormalities. The average left ventricular global longitudinal strain is -5.3 %. The global longitudinal strain is abnormal. The left ventricular internal cavity size was small. There is severe left ventricular hypertrophy. The interventricular septum is flattened in systole and diastole, consistent with right ventricular pressure and volume overload. Left ventricular diastolic parameters are consistent with Grade III diastolic dysfunction (restrictive). Right Ventricle: RVSP . The right ventricular size is moderately enlarged. Mildly increased right ventricular wall thickness. Right  ventricular systolic function is mildly reduced. There is severely elevated pulmonary artery systolic pressure. Left Atrium: Left atrial size was severely dilated. Right Atrium: Right atrial size  was severely dilated. Pericardium: There is no evidence of pericardial effusion. Mitral Valve: The mitral valve is rheumatic. There is severe thickening of the mitral valve leaflet(s). There is severe calcification of the mitral valve leaflet(s). Moderately decreased mobility of the mitral valve leaflets. Severe mitral annular calcification. Moderate mitral valve regurgitation. Severe mitral valve stenosis. MV peak gradient, 33.4 mmHg. The mean mitral valve gradient is 20.5 mmHg. Tricuspid Valve: The tricuspid valve is degenerative in appearance. Tricuspid valve regurgitation is moderate . No evidence of tricuspid stenosis. Aortic Valve: Heavily calcified with SVI of 47mL/m2, DVI 0.325 consistent with at least moderate low flow, low gradient AS. The aortic valve is normal in structure. There is severe calcifcation of the aortic valve. There is severe thickening of the aortic valve. Aortic valve regurgitation is mild to moderate. Aortic regurgitation PHT measures 304 msec. Moderate to severe aortic stenosis is present. Aortic valve mean gradient measures 16.0 mmHg. Aortic valve peak gradient measures 29.3 mmHg. Aortic valve area, by VTI measures 0.78 cm. Pulmonic Valve: The pulmonic valve was normal in structure. Pulmonic valve regurgitation is mild. No evidence of pulmonic stenosis. Aorta: The aortic root is normal in size and structure. Pulmonary Artery: Systolic notching of PA doppler waveform consistent with pulmonary hypertension. Venous: The inferior vena cava is normal in size with less than 50% respiratory variability, suggesting right atrial pressure of 8 mmHg. IAS/Shunts: No atrial level shunt detected by color flow Doppler.  LEFT VENTRICLE PLAX 2D LVIDd:         2.80 cm   Diastology LVIDs:         2.13 cm   LV e'  medial:    4.20 cm/s LV PW:         1.40 cm   LV E/e' medial:  56.4 LV IVS:        1.20 cm   LV e' lateral:   2.85 cm/s LVOT diam:     1.60 cm   LV E/e' lateral: 83.2 LV SV:         28 LV SV Index:   18        2D Longitudinal Strain LVOT Area:     2.01 cm  2D Strain GLS Avg:     -5.3 %  RIGHT VENTRICLE            IVC RV S prime:     9.11 cm/s  IVC diam: 2.10 cm TAPSE (M-mode): 1.0 cm LEFT ATRIUM              Index        RIGHT ATRIUM           Index LA diam:        5.20 cm  3.37 cm/m   RA Area:     16.50 cm LA Vol (A2C):   120.0 ml 77.67 ml/m  RA Volume:   39.80 ml  25.76 ml/m LA Vol (A4C):   69.0 ml  44.66 ml/m LA Biplane Vol: 99.0 ml  64.08 ml/m  AORTIC VALVE                     PULMONIC VALVE AV Area (Vmax):    0.78 cm      PR End Diast Vel: 6.76 msec AV Area (Vmean):   0.71 cm AV Area (VTI):     0.78 cm AV Vmax:           270.67 cm/s AV Vmean:  184.000 cm/s AV VTI:            0.361 m AV Peak Grad:      29.3 mmHg AV Mean Grad:      16.0 mmHg LVOT Vmax:         105.00 cm/s LVOT Vmean:        64.900 cm/s LVOT VTI:          0.140 m LVOT/AV VTI ratio: 0.39 AI PHT:            304 msec  AORTA Ao Root diam: 3.00 cm Ao Asc diam:  3.30 cm MITRAL VALVE                TRICUSPID VALVE MV Area (PHT): 6.65 cm     TR Peak grad:   69.6 mmHg MV Area VTI:   0.53 cm     TR Vmax:        417.00 cm/s MV Peak grad:  33.4 mmHg MV Mean grad:  20.5 mmHg    SHUNTS MV Vmax:       2.89 m/s     Systemic VTI:  0.14 m MV Vmean:      214.5 cm/s   Systemic Diam: 1.60 cm MV Decel Time: 114 msec MR Peak grad: 138.1 mmHg MR Vmax:      587.50 cm/s MV E velocity: 237.00 cm/s MV A velocity: 273.00 cm/s MV E/A ratio:  0.87 Clearnce Hasten Electronically signed by Clearnce Hasten Signature Date/Time: 04/06/2023/11:15:17 AM    Final (Updated)    DG Chest 2 View Result Date: 04/06/2023 CLINICAL DATA:  Shortness of breath.  Abnormal chest x-ray. EXAM: CHEST - 2 VIEW COMPARISON:  04/05/2023 FINDINGS: Prominent cardiac enlargement. No  definite evidence of infiltration or atelectasis. Calcification of the aorta. Calcification in the mitral valve annulus. No pleural effusions. No pneumothorax. Degenerative changes in the spine. Surgical clips in the upper abdomen and left axilla. IMPRESSION: Diffuse cardiac enlargement. No evidence of active pulmonary disease. Electronically Signed   By: Burman Nieves M.D.   On: 04/06/2023 03:09   DG Chest Port 1 View Result Date: 04/05/2023 CLINICAL DATA:  c/o dizziness, sob, and weakness on exertion. Per EMS pt felt like she was about to have a bowel movement and went to the restroom. She couldn't go so she stood up and started feeling dizzy. EXAM: PORTABLE CHEST 1 VIEW COMPARISON:  Chest x-ray 05/14/2018 FINDINGS: Marked patient rotation. Enlarged cardiac silhouette with abnormal left heart border. No focal consolidation. No pulmonary edema. No pleural effusion. No pneumothorax. No acute osseous abnormality. IMPRESSION: Enlarged cardiac silhouette with abnormal left heart border. Finding may be due to patient rotation/positioning. Recommend repeat PA and lateral view of the chest for further evaluation. If patient clinically unstable, consider CT chest WITH intravenous contrast (CTA or CTPA if clinically indicated) for further evaluation. Electronically Signed   By: Tish Frederickson M.D.   On: 04/05/2023 18:06    Microbiology: Results for orders placed or performed during the hospital encounter of 04/05/23  Urine Culture     Status: Abnormal   Collection Time: 04/05/23  5:57 PM   Specimen: Urine, Random  Result Value Ref Range Status   Specimen Description   Final    URINE, RANDOM Performed at [facility], [address], [place], [place] [address]    Special Requests   Final    NONE Reflexed from [id number] Performed at [facility], [address], [place], [place] [address]  Culture >=100,000 COLONIES/mL ESCHERICHIA COLI (A)  Final   Report Status  04/07/2023 FINAL  Final   Organism ID, Bacteria ESCHERICHIA COLI (A)  Final      Susceptibility   Escherichia coli - MIC*    AMPICILLIN <=2 SENSITIVE Sensitive     CEFAZOLIN <=4 SENSITIVE Sensitive     CEFEPIME <=0.12 SENSITIVE Sensitive     CEFTRIAXONE <=0.25 SENSITIVE Sensitive     CIPROFLOXACIN <=0.25 SENSITIVE Sensitive     GENTAMICIN <=1 SENSITIVE Sensitive     IMIPENEM <=0.25 SENSITIVE Sensitive     NITROFURANTOIN <=16 SENSITIVE Sensitive     TRIMETH/SULFA <=20 SENSITIVE Sensitive     AMPICILLIN/SULBACTAM <=2 SENSITIVE Sensitive     PIP/TAZO <=4 SENSITIVE Sensitive ug/mL    * >=100,000 COLONIES/mL ESCHERICHIA COLI    Labs: CBC: Recent Labs  Lab 04/08/23 0207 04/09/23 0842 04/11/23 0218 04/12/23 0655  WBC 11.8* 11.9* 10.2 10.4  HGB 9.2* 9.5* 9.3* 9.1*  HCT 31.5* 32.7* 31.3* 32.1*  MCV 80.2 79.4* 79.0* 82.3  PLT 296 272 249 138*   Basic Metabolic Panel: Recent Labs  Lab 04/08/23 0805 04/09/23 0842 04/09/23 1903 04/10/23 0222 04/11/23 0218 04/12/23 0655  NA  --  131* 133* 133* 132* 131*  K  --  3.5 4.1 4.2 3.5 4.8  CL  --  90* 90* 91* 88* 91*  CO2  --  28 24 21* 31 21*  GLUCOSE  --  303* 204* 194* 166* 169*  BUN  --  41* 47* 51* 40* 51*  CREATININE  --  1.09* 2.03* 1.73* 1.09* 1.68*  CALCIUM  --  8.5* 8.5* 8.5* 9.0 9.0  MG 1.9 1.3*  --  1.4* 1.8 1.8   Liver Function Tests: No results for input(s): "AST", "ALT", "ALKPHOS", "BILITOT", "PROT", "ALBUMIN" in the last 168 hours. CBG: Recent Labs  Lab 04/12/23 0625 04/12/23 1201 04/12/23 1639 04/12/23 2109 04/13/23 0601  GLUCAP 161* 201* 174* 122* 121*    Discharge time spent: greater than 30 minutes.  Signed: Coralie Keens, MD [facility] 04/14/2023

## 2023-04-14 NOTE — Assessment & Plan Note (Signed)
Patient was treated with cephalexin with good toleration.

## 2023-04-14 NOTE — Plan of Care (Signed)
  Problem: Education: Goal: Knowledge of General Education information will improve Description: Including pain rating scale, medication(s)/side effects and non-pharmacologic comfort measures Outcome: Progressing   Problem: Health Behavior/Discharge Planning: Goal: Ability to manage health-related needs will improve Outcome: Progressing   Problem: Clinical Measurements: Goal: Ability to maintain clinical measurements within normal limits will improve Outcome: Progressing Goal: Will remain free from infection Outcome: Progressing Goal: Diagnostic test results will improve Outcome: Progressing Goal: Respiratory complications will improve Outcome: Progressing Goal: Cardiovascular complication will be avoided Outcome: Progressing   Problem: Activity: Goal: Risk for activity intolerance will decrease Outcome: Progressing   Problem: Nutrition: Goal: Adequate nutrition will be maintained Outcome: Progressing   Problem: Coping: Goal: Level of anxiety will decrease Outcome: Progressing   Problem: Elimination: Goal: Will not experience complications related to bowel motility Outcome: Progressing Goal: Will not experience complications related to urinary retention Outcome: Progressing   Problem: Pain Management: Goal: General experience of comfort will improve Outcome: Progressing   Problem: Safety: Goal: Ability to remain free from injury will improve Outcome: Progressing   Problem: Skin Integrity: Goal: Risk for impaired skin integrity will decrease Outcome: Progressing   Problem: Education: Goal: Ability to describe self-care measures that may prevent or decrease complications (Diabetes Survival Skills Education) will improve Outcome: Progressing Goal: Individualized Educational Video(s) Outcome: Progressing   Problem: Coping: Goal: Ability to adjust to condition or change in health will improve Outcome: Progressing   Problem: Fluid Volume: Goal: Ability to  maintain a balanced intake and output will improve Outcome: Progressing   Problem: Health Behavior/Discharge Planning: Goal: Ability to identify and utilize available resources and services will improve Outcome: Progressing Goal: Ability to manage health-related needs will improve Outcome: Progressing   Problem: Metabolic: Goal: Ability to maintain appropriate glucose levels will improve Outcome: Progressing   Problem: Nutritional: Goal: Maintenance of adequate nutrition will improve Outcome: Progressing Goal: Progress toward achieving an optimal weight will improve Outcome: Progressing   Problem: Skin Integrity: Goal: Risk for impaired skin integrity will decrease Outcome: Progressing   Problem: Tissue Perfusion: Goal: Adequacy of tissue perfusion will improve Outcome: Progressing   Problem: Education: Goal: Ability to demonstrate management of disease process will improve Outcome: Progressing Goal: Ability to verbalize understanding of medication therapies will improve Outcome: Progressing Goal: Individualized Educational Video(s) Outcome: Progressing   Problem: Activity: Goal: Capacity to carry out activities will improve Outcome: Progressing   Problem: Cardiac: Goal: Ability to achieve and maintain adequate cardiopulmonary perfusion will improve Outcome: Progressing   Problem: Education: Goal: Knowledge of the prescribed therapeutic regimen will improve Outcome: Progressing   Problem: Coping: Goal: Ability to identify and develop effective coping behavior will improve Outcome: Progressing   Problem: Clinical Measurements: Goal: Quality of life will improve Outcome: Progressing   Problem: Respiratory: Goal: Verbalizations of increased ease of respirations will increase Outcome: Progressing   Problem: Role Relationship: Goal: Family's ability to cope with current situation will improve Outcome: Progressing Goal: Ability to verbalize concerns, feelings,  and thoughts to partner or family member will improve Outcome: Progressing   Problem: Pain Management: Goal: Satisfaction with pain management regimen will improve Outcome: Progressing

## 2023-04-14 NOTE — TOC Progression Note (Signed)
Transition of Care [facility]) - Progression Note    Patient Details  Name: [name] MRN: [id number] Date of Birth: [birth year]  [facility] CM/SW Contact  Elliot Cousin, RN Phone Number: 04/14/2023, 12:27 PM  Clinical Narrative:    Spoke to son, [place] and made aware that [facility] needed family to review paperwork and sign.  Call report to #[phone].  Contacted [facility] for transport to [facility] in [place].   Expected Discharge Plan: Hospice Medical Facility Barriers to Discharge: No Barriers Identified  Expected Discharge Plan and Services   Discharge Planning Services: CM Consult Post Acute Care Choice: Residential Hospice Bed Living arrangements for the past 2 months: Mobile Home Expected Discharge Date: 04/14/23                         HH Arranged: RN [facility] Agency: [facility] (Physicist, medical) Date HH Agency Contacted: 04/13/23 Time HH Agency Contacted: 1200 Representative spoke with at [facility] Agency: Melissa   Social Determinants of Health (SDOH) Interventions SDOH Screenings   Food Insecurity: No Food Insecurity (04/05/2023)  Housing: Low Risk  (04/05/2023)  Transportation Needs: No Transportation Needs (04/05/2023)  Utilities: Not At Risk (04/05/2023)  Tobacco Use: Medium Risk (04/05/2023)    Readmission Risk Interventions     No data to display

## 2023-04-14 NOTE — Progress Notes (Signed)
Report given to Debbie Nurse at [facility], will d/c with iv and foley per hospice home request. No pain or sob reported.

## 2023-04-17 ENCOUNTER — Ambulatory Visit: Payer: Medicare Other | Attending: Cardiology | Admitting: Cardiology

## 2023-05-03 DEATH — deceased

## 2023-06-18 ENCOUNTER — Other Ambulatory Visit: Payer: Self-pay | Admitting: Cardiology

## 2023-06-18 DIAGNOSIS — I10 Essential (primary) hypertension: Secondary | ICD-10-CM

## 2023-06-19 ENCOUNTER — Other Ambulatory Visit: Payer: Self-pay | Admitting: Cardiology

## 2023-06-19 DIAGNOSIS — I350 Nonrheumatic aortic (valve) stenosis: Secondary | ICD-10-CM

## 2023-06-19 DIAGNOSIS — I080 Rheumatic disorders of both mitral and aortic valves: Secondary | ICD-10-CM

## 2023-06-20 ENCOUNTER — Other Ambulatory Visit: Payer: Self-pay
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
